# Patient Record
Sex: Female | Born: 1945
Health system: Southern US, Community
[De-identification: ages and names within clinical notes are randomized; demographics above are authoritative.]

## PROBLEM LIST (undated history)

## (undated) DIAGNOSIS — E039 Hypothyroidism, unspecified: Secondary | ICD-10-CM

## (undated) DIAGNOSIS — T4145XA Adverse effect of unspecified anesthetic, initial encounter: Secondary | ICD-10-CM

## (undated) DIAGNOSIS — I10 Essential (primary) hypertension: Secondary | ICD-10-CM

## (undated) DIAGNOSIS — Z9889 Other specified postprocedural states: Secondary | ICD-10-CM

## (undated) DIAGNOSIS — M069 Rheumatoid arthritis, unspecified: Secondary | ICD-10-CM

## (undated) DIAGNOSIS — R112 Nausea with vomiting, unspecified: Secondary | ICD-10-CM

## (undated) DIAGNOSIS — M1990 Unspecified osteoarthritis, unspecified site: Secondary | ICD-10-CM

## (undated) DIAGNOSIS — T8859XA Other complications of anesthesia, initial encounter: Secondary | ICD-10-CM

## (undated) DIAGNOSIS — G4730 Sleep apnea, unspecified: Secondary | ICD-10-CM

## (undated) DIAGNOSIS — K219 Gastro-esophageal reflux disease without esophagitis: Secondary | ICD-10-CM

## (undated) DIAGNOSIS — S0300XA Dislocation of jaw, unspecified side, initial encounter: Secondary | ICD-10-CM

## (undated) DIAGNOSIS — I639 Cerebral infarction, unspecified: Secondary | ICD-10-CM

## (undated) DIAGNOSIS — G709 Myoneural disorder, unspecified: Secondary | ICD-10-CM

## (undated) DIAGNOSIS — D649 Anemia, unspecified: Secondary | ICD-10-CM

## (undated) HISTORY — DX: Rheumatoid arthritis, unspecified: M06.9

## (undated) HISTORY — PX: ANKLE SURGERY: SHX546

## (undated) HISTORY — PX: HAND ARTHROPLASTY: SHX968

## (undated) HISTORY — PX: TONSILLECTOMY: SUR1361

## (undated) HISTORY — DX: Cerebral infarction, unspecified: I63.9

## (undated) HISTORY — PX: COLON SURGERY: SHX602

## (undated) HISTORY — PX: ABDOMINAL SURGERY: SHX537

---

## 2000-07-25 ENCOUNTER — Ambulatory Visit (HOSPITAL_COMMUNITY): Admission: RE | Admit: 2000-07-25 | Discharge: 2000-07-25 | Payer: Self-pay | Admitting: Gastroenterology

## 2001-04-24 ENCOUNTER — Ambulatory Visit (HOSPITAL_COMMUNITY): Admission: RE | Admit: 2001-04-24 | Discharge: 2001-04-24 | Payer: Self-pay | Admitting: Obstetrics and Gynecology

## 2001-04-24 ENCOUNTER — Encounter: Payer: Self-pay | Admitting: Obstetrics and Gynecology

## 2002-04-29 ENCOUNTER — Ambulatory Visit (HOSPITAL_COMMUNITY): Admission: RE | Admit: 2002-04-29 | Discharge: 2002-04-29 | Payer: Self-pay | Admitting: Obstetrics and Gynecology

## 2002-04-29 ENCOUNTER — Encounter: Payer: Self-pay | Admitting: Obstetrics and Gynecology

## 2003-05-05 ENCOUNTER — Encounter: Payer: Self-pay | Admitting: Obstetrics and Gynecology

## 2003-05-05 ENCOUNTER — Ambulatory Visit (HOSPITAL_COMMUNITY): Admission: RE | Admit: 2003-05-05 | Discharge: 2003-05-05 | Payer: Self-pay | Admitting: Obstetrics and Gynecology

## 2003-08-08 HISTORY — PX: LUMBAR SPINE SURGERY: SHX701

## 2003-08-08 HISTORY — PX: ABDOMINAL HYSTERECTOMY: SHX81

## 2004-04-20 ENCOUNTER — Ambulatory Visit: Payer: Self-pay | Admitting: Physical Medicine & Rehabilitation

## 2004-04-20 ENCOUNTER — Inpatient Hospital Stay (HOSPITAL_COMMUNITY): Admission: EM | Admit: 2004-04-20 | Discharge: 2004-04-28 | Payer: Self-pay | Admitting: Emergency Medicine

## 2004-04-20 ENCOUNTER — Ambulatory Visit: Payer: Self-pay | Admitting: Infectious Diseases

## 2004-04-22 ENCOUNTER — Ambulatory Visit: Payer: Self-pay | Admitting: Family Medicine

## 2004-04-23 ENCOUNTER — Encounter: Payer: Self-pay | Admitting: Family Medicine

## 2004-04-25 ENCOUNTER — Encounter (INDEPENDENT_AMBULATORY_CARE_PROVIDER_SITE_OTHER): Payer: Self-pay | Admitting: *Deleted

## 2004-04-28 ENCOUNTER — Ambulatory Visit: Payer: Self-pay | Admitting: Physical Medicine & Rehabilitation

## 2004-04-28 ENCOUNTER — Inpatient Hospital Stay
Admission: RE | Admit: 2004-04-28 | Discharge: 2004-05-05 | Payer: Self-pay | Admitting: Physical Medicine & Rehabilitation

## 2004-04-28 ENCOUNTER — Ambulatory Visit: Payer: Self-pay | Admitting: Psychology

## 2004-05-05 ENCOUNTER — Inpatient Hospital Stay (HOSPITAL_COMMUNITY): Admission: AD | Admit: 2004-05-05 | Discharge: 2004-05-07 | Payer: Self-pay | Admitting: Family Medicine

## 2004-05-18 ENCOUNTER — Ambulatory Visit: Payer: Self-pay | Admitting: Physical Medicine & Rehabilitation

## 2004-06-20 ENCOUNTER — Ambulatory Visit (HOSPITAL_COMMUNITY): Admission: RE | Admit: 2004-06-20 | Discharge: 2004-06-20 | Payer: Self-pay | Admitting: Obstetrics and Gynecology

## 2004-06-20 ENCOUNTER — Ambulatory Visit: Payer: Self-pay | Admitting: Infectious Diseases

## 2004-09-07 ENCOUNTER — Ambulatory Visit: Payer: Self-pay | Admitting: Infectious Diseases

## 2004-12-26 ENCOUNTER — Ambulatory Visit (HOSPITAL_COMMUNITY): Admission: RE | Admit: 2004-12-26 | Discharge: 2004-12-26 | Payer: Self-pay | Admitting: Family Medicine

## 2005-01-03 ENCOUNTER — Ambulatory Visit (HOSPITAL_COMMUNITY): Admission: RE | Admit: 2005-01-03 | Discharge: 2005-01-03 | Payer: Self-pay | Admitting: Family Medicine

## 2005-06-21 ENCOUNTER — Ambulatory Visit (HOSPITAL_COMMUNITY): Admission: RE | Admit: 2005-06-21 | Discharge: 2005-06-21 | Payer: Self-pay | Admitting: Family Medicine

## 2005-06-22 ENCOUNTER — Inpatient Hospital Stay (HOSPITAL_COMMUNITY): Admission: EM | Admit: 2005-06-22 | Discharge: 2005-06-26 | Payer: Self-pay | Admitting: Emergency Medicine

## 2005-07-06 ENCOUNTER — Ambulatory Visit (HOSPITAL_COMMUNITY): Admission: RE | Admit: 2005-07-06 | Discharge: 2005-07-06 | Payer: Self-pay

## 2005-08-01 ENCOUNTER — Ambulatory Visit (HOSPITAL_COMMUNITY): Admission: RE | Admit: 2005-08-01 | Discharge: 2005-08-01 | Payer: Self-pay

## 2005-08-01 ENCOUNTER — Ambulatory Visit (HOSPITAL_COMMUNITY): Admission: RE | Admit: 2005-08-01 | Discharge: 2005-08-01 | Payer: Self-pay | Admitting: Obstetrics and Gynecology

## 2005-08-13 ENCOUNTER — Emergency Department (HOSPITAL_COMMUNITY): Admission: EM | Admit: 2005-08-13 | Discharge: 2005-08-13 | Payer: Self-pay | Admitting: Emergency Medicine

## 2005-08-14 ENCOUNTER — Ambulatory Visit: Payer: Self-pay | Admitting: Orthopedic Surgery

## 2005-08-23 ENCOUNTER — Ambulatory Visit: Payer: Self-pay | Admitting: Orthopedic Surgery

## 2005-08-24 ENCOUNTER — Encounter (HOSPITAL_COMMUNITY): Admission: RE | Admit: 2005-08-24 | Discharge: 2005-09-23 | Payer: Self-pay | Admitting: Orthopedic Surgery

## 2005-09-13 ENCOUNTER — Ambulatory Visit: Payer: Self-pay | Admitting: Orthopedic Surgery

## 2005-10-03 ENCOUNTER — Inpatient Hospital Stay (HOSPITAL_COMMUNITY): Admission: EM | Admit: 2005-10-03 | Discharge: 2005-10-06 | Payer: Self-pay | Admitting: Emergency Medicine

## 2005-10-22 ENCOUNTER — Inpatient Hospital Stay (HOSPITAL_COMMUNITY): Admission: EM | Admit: 2005-10-22 | Discharge: 2005-11-03 | Payer: Self-pay | Admitting: Emergency Medicine

## 2005-10-27 ENCOUNTER — Encounter (INDEPENDENT_AMBULATORY_CARE_PROVIDER_SITE_OTHER): Payer: Self-pay | Admitting: Specialist

## 2006-04-06 ENCOUNTER — Encounter (INDEPENDENT_AMBULATORY_CARE_PROVIDER_SITE_OTHER): Payer: Self-pay | Admitting: *Deleted

## 2006-04-06 ENCOUNTER — Inpatient Hospital Stay (HOSPITAL_COMMUNITY): Admission: RE | Admit: 2006-04-06 | Discharge: 2006-04-14 | Payer: Self-pay

## 2006-08-09 ENCOUNTER — Ambulatory Visit (HOSPITAL_COMMUNITY): Admission: RE | Admit: 2006-08-09 | Discharge: 2006-08-09 | Payer: Self-pay | Admitting: Family Medicine

## 2007-05-30 ENCOUNTER — Ambulatory Visit (HOSPITAL_COMMUNITY): Admission: RE | Admit: 2007-05-30 | Discharge: 2007-05-30 | Payer: Self-pay | Admitting: Orthopedic Surgery

## 2007-09-27 ENCOUNTER — Ambulatory Visit (HOSPITAL_COMMUNITY): Admission: RE | Admit: 2007-09-27 | Discharge: 2007-09-27 | Payer: Self-pay | Admitting: Family Medicine

## 2008-10-13 ENCOUNTER — Ambulatory Visit (HOSPITAL_COMMUNITY): Admission: RE | Admit: 2008-10-13 | Discharge: 2008-10-13 | Payer: Self-pay | Admitting: Family Medicine

## 2008-10-22 ENCOUNTER — Encounter (HOSPITAL_COMMUNITY): Admission: RE | Admit: 2008-10-22 | Discharge: 2008-11-21 | Payer: Self-pay | Admitting: Family Medicine

## 2008-10-22 ENCOUNTER — Ambulatory Visit (HOSPITAL_COMMUNITY): Payer: Self-pay | Admitting: Family Medicine

## 2009-12-13 ENCOUNTER — Ambulatory Visit (HOSPITAL_COMMUNITY): Admission: RE | Admit: 2009-12-13 | Discharge: 2009-12-13 | Payer: Self-pay | Admitting: Family Medicine

## 2010-04-07 ENCOUNTER — Encounter: Admission: RE | Admit: 2010-04-07 | Discharge: 2010-04-07 | Payer: Self-pay | Admitting: General Surgery

## 2010-04-18 ENCOUNTER — Ambulatory Visit (HOSPITAL_BASED_OUTPATIENT_CLINIC_OR_DEPARTMENT_OTHER): Admission: RE | Admit: 2010-04-18 | Discharge: 2010-04-18 | Payer: Self-pay | Admitting: General Surgery

## 2010-08-27 ENCOUNTER — Encounter: Payer: Self-pay | Admitting: Obstetrics and Gynecology

## 2010-10-04 ENCOUNTER — Emergency Department (HOSPITAL_COMMUNITY)
Admission: EM | Admit: 2010-10-04 | Discharge: 2010-10-05 | Disposition: A | Discharge status: Home / Self Care | Payer: Medicare Other | Attending: Emergency Medicine | Admitting: Emergency Medicine

## 2010-10-04 DIAGNOSIS — R109 Unspecified abdominal pain: Secondary | ICD-10-CM | POA: Insufficient documentation

## 2010-10-04 DIAGNOSIS — K219 Gastro-esophageal reflux disease without esophagitis: Secondary | ICD-10-CM | POA: Insufficient documentation

## 2010-10-04 DIAGNOSIS — E039 Hypothyroidism, unspecified: Secondary | ICD-10-CM | POA: Insufficient documentation

## 2010-10-04 DIAGNOSIS — M069 Rheumatoid arthritis, unspecified: Secondary | ICD-10-CM | POA: Insufficient documentation

## 2010-10-04 DIAGNOSIS — I10 Essential (primary) hypertension: Secondary | ICD-10-CM | POA: Insufficient documentation

## 2010-10-04 DIAGNOSIS — R195 Other fecal abnormalities: Secondary | ICD-10-CM | POA: Insufficient documentation

## 2010-10-04 DIAGNOSIS — Z79899 Other long term (current) drug therapy: Secondary | ICD-10-CM | POA: Insufficient documentation

## 2010-10-04 DIAGNOSIS — R112 Nausea with vomiting, unspecified: Secondary | ICD-10-CM | POA: Insufficient documentation

## 2010-10-04 LAB — DIFFERENTIAL
Basophils Absolute: 0 10*3/uL (ref 0.0–0.1)
Basophils Relative: 0 % (ref 0–1)
Eosinophils Absolute: 0 10*3/uL (ref 0.0–0.7)
Lymphocytes Relative: 20 % (ref 12–46)
Neutro Abs: 4.8 10*3/uL (ref 1.7–7.7)

## 2010-10-04 LAB — OCCULT BLOOD, POC DEVICE: Fecal Occult Bld: POSITIVE

## 2010-10-04 LAB — CBC
Hemoglobin: 13.5 g/dL (ref 12.0–15.0)
MCH: 31.3 pg (ref 26.0–34.0)
WBC: 6.6 10*3/uL (ref 4.0–10.5)

## 2010-10-04 LAB — BASIC METABOLIC PANEL
BUN: 14 mg/dL (ref 6–23)
Calcium: 9.9 mg/dL (ref 8.4–10.5)
GFR calc Af Amer: >60 mL/min (ref >60)
GFR calc non Af Amer: 52 mL/min — ABNORMAL LOW (ref >60)
Glucose, Bld: 119 mg/dL — ABNORMAL HIGH (ref 70–99)
Potassium: 4.3 mEq/L (ref 3.5–5.1)

## 2010-10-13 ENCOUNTER — Ambulatory Visit
Admission: RE | Admit: 2010-10-13 | Discharge: 2010-10-13 | Disposition: A | Discharge status: Home / Self Care | Payer: Medicare Other | Source: Ambulatory Visit | Attending: Gastroenterology | Admitting: Gastroenterology

## 2010-10-13 ENCOUNTER — Other Ambulatory Visit: Payer: Self-pay | Admitting: Gastroenterology

## 2010-10-13 DIAGNOSIS — R1110 Vomiting, unspecified: Secondary | ICD-10-CM

## 2010-10-13 DIAGNOSIS — R110 Nausea: Secondary | ICD-10-CM

## 2010-10-13 MED ORDER — IOHEXOL 300 MG/ML  SOLN
100.0000 mL | Freq: Once | INTRAMUSCULAR | Status: AC | PRN
  Administered 2010-10-13: 100 mL via INTRAVENOUS

## 2010-10-20 LAB — POCT I-STAT, CHEM 8
BUN: 19 mg/dL (ref 6–23)
Calcium, Ion: 1.2 mmol/L (ref 1.12–1.32)
Chloride: 104 mEq/L (ref 96–112)
Potassium: 3.7 mEq/L (ref 3.5–5.1)
Sodium: 140 mEq/L (ref 135–145)
TCO2: 29 mmol/L (ref 0–100)

## 2010-12-23 NOTE — Consult Note (Signed)
NAME:  Sabrina, Adams NO.:  [id number]   MEDICAL RECORD NO.:  [id number]                   PATIENT TYPE:  INP   LOCATION:  2102                                 FACILITY:  [facility]   PHYSICIAN:  Hilda Lias, M.D.                DATE OF BIRTH:  December 04, 1945   DATE OF CONSULTATION:  04/22/2004  DATE OF DISCHARGE:                                   CONSULTATION   Sabrina Adams is a lady who was admitted to [facility] with lower  back pain and abdominal pain.  She was admitted on April 20, 2004.  Today about 11:00 a.m. I was called by Dr. Gerda Diss to let me know about this  patient and they found the possibility that she might have pus in the spinal  cord.  Also he told me that the patient has a  positive blood culture and  she had a completed workup of the abdomen which was negative.  Because of  the finding I told him that ideally the patient needs to be transferred to  the Medical Service here at [facility], and we will review the x-  ray and we will talk about the need for surgery.  I told him that I was  concerned about the pus in the spinal cord itself with the possibility of  already displacement of the cord.  Since then they called the Teaching  Service and the patient was accepted about 1:30.  I have been waiting for  the patient to be transferred to [facility] and the operative room  was on standby.  About half an hour ago while I was in surgery I got call  that the patient finally arrived from [facility].  The patient was  admitted to 70.  The patient was telling me that the delay was because  they were doing more MRIs of the lumbar spine.  This lady is quite sick, she  is having quite a bit of tenderness in the thoracolumbar area.  She had been  complaining of constipation.  She had been having some abdominal pain.  This  problem started this week.  At [facility] she was started on IV  antibiotic.  She  has had evaluation by the gastroenterologist.  No film was  sent, but I was able to see the x-ray with Dr. Manson Passey from the X-ray  Department.  This lady has had decreased appetite, she is nauseated, she  cannot eat, and she had been constipated.  She denies any problem with her  bladder.   PAST MEDICAL HISTORY:  1.  She has a previous appendectomy.  2.  Oophorectomy.  3.  Colonoscopy.  4.  Surgery on the eyes.  5.  She has multiple problems with the hands and the extremities secondary      to rheumatoid arthritis.   MEDICATIONS:  1.  Prednisone 5 mg 1/2  tablet every day.  2.  Plaquenil 200 mg two times a day.  3.  Diovan.  4.  Aspirin.  5.  Enbrel 25 mg twice a week.  6.  Calcium.  7.  Nexium.   FAMILY HISTORY:  Positive for coronary artery disease and a stroke.   ALLERGIES:  SHE IS NOT ALLERGIC TO ANY MEDICATIONS.   PHYSICAL EXAMINATION:  A patient who is in the 5727 room, she is quite sick.  In talking to her husband, she feels nauseated.  HEENT:  Normal.  NECK:  She is able to flex to the right with minimal discomfort.  LUNGS:  Unremarkable bilaterally.  HEART:  Sounds normal.  ABDOMEN:  There is some tenderness in the left lower quadrant.  I cannot  feel any mass.  EXTREMITIES:  She had deformity of the hands secondary to the chronic  rheumatoid arthritis.  NEUROLOGIC:  Mental status normal.  During my physical examination she was  complaining of some bifrontal headache.  Reflexes are 1+, no __________  not  really specific.  Strength seems to be normal, but it is quite limited  secondary to the pain.  Per patient the thoracolumbar area is quite tender.  I reviewed the MRI through the screen.  I do not have the picture in front  of me, but looking at the MRI with Dr. Manson Passey it seemed that this lady has a  large collection of probably pus in the thoracolumbar and also T12 until  about L3-L4.  She has also severe stenosis at this level and there is  displacement of the  spinal cord itself.  I had to point out that I had note  of the MRI with me, not the official report.   CLINICAL IMPRESSION:  1.  Sepsis.  Subdural empyema from T12 down to L3-L4.  2.  History of rheumatoid arthritis.   RECOMMENDATIONS:  I talked to her and her husband at length.  I told them  that I wanted to take her to surgery as soon as possible to do a  thoracolumbar laminotomy and open the dura matter to remove the empyema.  I  told him that this also needs to be done as soon as possible and we did not  wait too long because of the high possibility of worsening including the  possibility of paraplegia.  The risks were explained including the  possibility of infection, paralysis, no improvement whatsoever, worsening of  her condition.  It is 8:30, I called the operating room, we are going to  take this lady as soon as possible for surgery.      EB/MEDQ  D:  04/22/2004  T:  04/25/2004  Job:  [id number]

## 2010-12-23 NOTE — Discharge Summary (Signed)
NAME:  Sabrina Adams, Sabrina Adams NO.:  [id number]   MEDICAL RECORD NO.:  [id number]            PATIENT TYPE:   LOCATION:                                 FACILITY:   PHYSICIAN:  Lebron Conners, M.D.        DATE OF BIRTH:   DATE OF ADMISSION:  DATE OF DISCHARGE:  11/03/2005                                 DISCHARGE SUMMARY   HISTORY:  This is a 65 year old white female with recurrent diverticulitis  of the sigmoid colon with pelvic abscess.  She had been hospitalized in  November at [facility] and had had smoldering problems since that  time.  CT on this date showed a 5 cm abscess adjacent to the rectosigmoid.  She was admitted to the hospital for drainage, antibiotic treatment; and  probable colectomy.  She has no GI problems.   PAST MEDICAL HISTORY:  Remarkable for rheumatoid arthritis.  She had been on  steroids.  She has high blood pressure.  She is not diabetic.  The patient  has had back problems; and had an epidural abscess treated by surgery in  2005.  She had MRSA at that location, I think.  She is on prednisone,  Dilaudid, Phenergan, aspirin, Plaquenil, Neurontin, Diovan, and Mobic.   See history and physical for further details.   PHYSICAL EXAM:  Remarkable for tenderness in the suprapubic area and left  lower quadrant of the abdomen.  No rebound tenderness was found.  There were  had deformities consistent with rheumatoid arthritis.   HOSPITAL COURSE:  I admitted the patient and requested percutaneous drainage  of the pelvic abscess.  This was done by Dr. Elly Modena on October 23, 2005.  The patient did better.  White count came down and she remained afebrile.  The patient was advised to undergo colectomy and was warned that it might  require colostomy if inflammatory change was bad enough.  I took her to the  operating room on October 27, 2005 and did a resection of the rectosigmoid.  There was persistent abscess and marked inflammatory change and  low  resection was required.  I stapled the distal segment and did a diverting  sigmoid colostomy.  The patient did well during the operation.  Postoperatively she had quite a bit of edema and I gave her Lasix to help  that.  She had hypokalemia after that and required potassium replacement.   She developed a wound infection and I had to open the wound and institute  dressing changes.  No organisms grew out.  It is anticipated that she will  have eventual vacuum-assisted closure device for the wound, but this was not  instituted at the time of discharge although it had been used in the  hospital.  Daily nursing changes were arranged.  The patient is asked to see  me in the office in 10-14 days.  She is to use the pain medicine that she  was using before.  Antibiotics will be administered at the time of  discharge.  The patient's colostomy is working well; and she is eating well.  DIAGNOSES:  1.  Diverticulitis of the sigmoid and rectosigmoid with pelvic abscess.  2.  Rheumatoid arthritis.  3.  Hypertension.  4.  Postoperative kalemia.  5.  Wound infection.   OPERATION:  1.  Percutaneous drainage of pelvic abscess.  2.  Sigmoid colectomy with colostomy and stapling of the distal segment.   DISCHARGE CONDITION:  Improved.      Lebron Conners, M.D.  Electronically Signed     WB/MEDQ  D:  11/08/2005  T:  11/09/2005  Job:  [id number]   cc:   Feliberto Gottron. Turner Daniels, M.D.  Fax: [phone]   Brooke Bonito, M.D.  Fax: [phone]

## 2010-12-23 NOTE — Discharge Summary (Signed)
NAME:  Sabrina Adams, Sabrina Adams NO.:  [id number]   MEDICAL RECORD NO.:  [id number]          PATIENT TYPE:  INP   LOCATION:  1315                         FACILITY:  [facility]   PHYSICIAN:  Lebron Conners, M.D.   DATE OF BIRTH:  1945-11-18   DATE OF ADMISSION:  10/02/2005  DATE OF DISCHARGE:  10/06/2005                                 DISCHARGE SUMMARY   HISTORY:  This is a 65 year old white female who was recently hospitalized  here for acute diverticulitis with an inflammatory mass in the pelvis.  At  that time, it was not felt to be drainable by interventional radiology, and  she improved on antibiotic treatment and had continued outpatient treatment  with improvement.  However, she had done worse over the 3 days prior to  readmission and was admitted to the hospital for care by Dr. Carolynne Edouard with a  diagnosis of acute diverticulitis with pelvic abscess.  See the history and  physical for greater details.  She also has gastroesophageal reflux and  rheumatoid arthritis and is on prednisone, Plaquenil, Nexium, Diovan,  hydrochlorothiazide, Neurontin, Mobic, vitamin B, Colace, calcium carbonate,  and multivitamins.  She has intolerances of CODEINE and PERCOCET.   HOSPITAL COURSE:  The patient was admitted and started on intravenous  antibiotics.  Dr. Nehemiah Settle consulted for [facility] to help with  management of medical problems.  Boosts in steroids were given.  Interventional radiology saw the patient and felt that she was a good  candidate to have pelvic abscess drained.  That took place on October 03, 2005, with drainage of purulent material.  She remained stable, defervesced,  and felt better.  White count on February 28 was 19,000.  Antibiotics were  continued.  Follow-up CT was done on October 06, 2005.  It showed good  resolution of the abscess, and her white count was down to 12.3.  The  drainage catheter was removed prior to discharge.  The patient was sent home  on oral  broad-spectrum antibiotics, and short-term follow-up was arranged  with me.  She was to resume all prehospital medications.   DIAGNOSIS:  1.  Acute diverticulitis of the sigmoid colon with abscess.  2.  Rheumatoid arthritis.  3.  Hypertension.  4.  Gastroesophageal reflux disease.   OPERATION:  Image guided drainage of pelvic abscess.   DISCHARGE CONDITION:  Improved.      Lebron Conners, M.D.  Electronically Signed     WB/MEDQ  D:  10/18/2005  T:  10/19/2005  Job:  [id number]

## 2010-12-23 NOTE — Discharge Summary (Signed)
NAMEMarland Adams  Sabrina, Adams NO.:  [id number]   MEDICAL RECORD NO.:  [id number]          PATIENT TYPE:  ORB   LOCATION:  4502                         FACILITY:  [facility]   PHYSICIAN:  Ellwood Dense, M.D.   DATE OF BIRTH:  [birth year]   DATE OF ADMISSION:  04/28/2004  DATE OF DISCHARGE:  05/05/2004                                 DISCHARGE SUMMARY   DISCHARGE DIAGNOSES:  1.  Delirium with a question of paranoia.  2.  I&D epidural abscess.  3.  History of methicillin-susceptible staphylococcus aureus infection.  4.  Rheumatoid arthritis.  5.  Hypertension.  6.  Mild hypokalemia.   HISTORY OF PRESENT ILLNESS:  Sabrina Adams is a 65 year old female with a  history of RA, hypertension, originally admitted to [facility] with  low back pain and abdominal pain.  Workup done revealed inflammation of  colon and gram positive cocci in blood.  She was treated with antibiotics  without any improvement.  Further workup done revealed epidural abscess and  subdural lymphedema T12 to L3-L4 region.  The patient was transferred to  [facility] and underwent thoracolumbar laminectomy for evacuation  of abscess on September 17 by Dr. Jeral Fruit.  Postoperatively, was continued on  IV Ancef for methicillin-susceptible staphylococcus aureus bacteremia,  epidural abscess and osteomyelitis treatment.  ID recommended antibiotics  for 42 days and repeat MRI of the spine in four weeks.  Postoperatively, she  had problems with nausea and constipation.  The patient was made n.p.o.  secondary to colonic ileus.  Therapy was initiated and the patient is  minimal assist for transfers, close supervision for ambulating 12 feet with  a rolling walker.   PAST MEDICAL HISTORY:  Significant for RA, diverticulosis, appendectomy,  hysterectomy, right knee cartilage excision, left foot Morton's neuroma.   ALLERGIES:  CODEINE, VICODIN AND OXYCODONE.   FAMILY HISTORY:  Positive for coronary artery  disease and CVA.   SOCIAL HISTORY:  The patient is married.  Back to work 7/05 past recent  surgery.  She was independent prior to admission.  She lives in a two-level  homes with two to three steps at the entry.  She does not use any tobacco or  alcohol.   HOSPITAL COURSE:  Sabrina Adams was admitted on September 22 for therapies to  consist of PT, OT daily.  Past admission, the patient was maintained on IV  Ancef throughout her stay.  Reglan was added secondary to colonic ileus, and  diet was slowly advanced to regular.   LABORATORY DATA:  Labs done on September 23 revealed sodium 136, potassium  3.4, chloride 104, CO2 29.  BUN 5, creatinine 0.7, glucose 97.  The  patient's hypokalemia and was supplemented, and continued to avoid any  recurrence of ileus.  CBC done showed hemoglobin 11.4, hematocrit 32.3,  white count 11.6, platelets 580,000.  The patient's back incision was  monitored along, and it was noted to be healing well without any signs or  symptoms of infection.  Initially, the patient progressed along well.  The  patient reached modified independent goal for ADL's, modified independent  goal for ambulating 200 feet.  She was set for discharge on May 04, 2004.  However, on that morning, the patient was noted to be __________  with difficulty to redirect.  She was noted to be writing statements over  and over in a little notebook, very suspicious of staff.  Dr. Leonides Cave,  psychology, was consulted for input, and he questioned brief reactive  psychosis with anxiety disorder versus mood disorder with psychotic  features.  No obvious psychological and medical factors to account for the  patient's apparent mental status changes.  He also recommended psychiatric  consultation prior to discharge.   On September 28 in the p.m., the patient was noted to have swelling in the  left clavicular area.  CT of the neck done showed no acute abnormality.  No  focal fluid collection.   Chest x-ray done showed bibasilar atelectasis.   LABORATORY DATA:  Check of labs on September 29 revealed sodium 131,  potassium 3.6, chloride 95, CO 26, BUN 6, creatinine 0.7.  Glucose 97.  Check of CBC revealed leukocytosis to be resolving with white count of 7.2.  Dr. Antonietta Breach was consulted for input.  He felt the patient with  delirium, however continued to initiate organic workup with imaging blood  studies for reversible CNS cause.  Complete workup including imaging as well  as blood cultures were ordered to rule out extension of infection.  Also,  Family Practice teaching service was consulted regarding input.  MRI of the  head was ordered.  MRI of the brain was ordered showing stable to slightly  improved __________  enhancement over frontal lobe and tentorium.  MRI of  the lumbar spine revealed overall improvement, status post __________  for  epidural access.  Check of labs showed the patient with some further  decrease in sodium at 131, otherwise within normal limits.  FPTS was  consulted regarding input and further workup monitoring on acute floor as  the patient had reached her rehabilitation goals.  The patient's insight  continued to be poor with mood being depressed and tearful.  She continued  to exhibit paranoid behavior.  On May 05, 2004, the patient was  discharged to acute floor for further workup and monitoring.   DISCHARGE MEDICATIONS:  1.  Ecotrin 81 mg a day.  2.  Ancef 2 mg IV q.8h.  3.  Tequin 200 mg q.12h.  4.  Avapro 200 mg a day.  5.  Prednisone 25 mg a day.  6.  Protonix 40 mg a day.  7.  Dulcolax suppositories, p.r.n.  8.  Darvocet 100, one to two p.o. q.i.d. p.r.n.   DISCHARGE INSTRUCTIONS:  Activity and further adjustment in meds to be done  by FPTS.       PP/MEDQ  D:  06/29/2004  T:  06/30/2004  Job:  [id number]   cc:   Lorin Picket A. Gerda Diss, MD  [address], [address]  [place]  [place] [address]  Fax: [phone]  Hilda Lias, M.D.   [address]  [place], [place] [address]  Fax: [phone]   Antonietta Breach, M.D.   [address]  [address]  [place]  [place] [address]  Fax: [phone]

## 2010-12-23 NOTE — Procedures (Signed)
[place]  Patient:    Sabrina Adams, Sabrina Adams                   MRN: [id number] Proc. Date: 07/25/00 Adm. Date:  [id number] Attending:  Charna Elizabeth CC:         Loran Senters, M.D.   Procedure Report  DATE OF BIRTH:  1946-04-24  REFERRING PHYSICIAN:  Loran Senters, M.D.  PROCEDURE PERFORMED:  Colonoscopy.  ENDOSCOPIST:  Anselmo Rod, M.D.  INSTRUMENT USED:  Olympus video colonoscope.  INDICATIONS FOR PROCEDURE:  Change in bowel habits with guaiac positive stool, worsening constipation in a 64 year old white female, rule out colonic polyps, masses, hemorrhoids, etc.  PREPROCEDURE PREPARATION:  Informed consent was procured from the patient. The patient was fasted for eight hours prior to the procedure and prepped with a bottle of magnesium citrate and a gallon of NuLytely the night prior to the procedure.  PREPROCEDURE PHYSICAL:  The patient had stable vital signs.  Neck supple. Chest clear to auscultation.  S1, S2 regular.  Abdomen soft with normal abdominal bowel sounds.  DESCRIPTION OF PROCEDURE:  The patient was placed in the left lateral decubitus position and sedated with 100 mg of Demerol and 10 mg of Versed intravenously.  Once the patient was adequately sedated and maintained on low-flow oxygen and continuous cardiac monitoring, the Olympus video colonoscope was advanced from the rectum to the cecum without difficulty. Except for a few early diverticula and small nonbleeding internal hemorrhoids, no other abnormalities were seen.  The patient tolerated the procedure well which was complete up to the cecum.  IMPRESSION: 1. Few left-sided diverticula. 2. Small nonbleeding internal hemorrhoids.  RECOMMENDATIONS:  The patient has been advised to increase the fluid and fiber in her diet and outpatient follow-up is advised on a p.r.n. basis.DD:  07/25/00 TD:  07/26/00 Job: [id number]

## 2010-12-23 NOTE — H&P (Signed)
NAME:  Sabrina Adams, Sabrina Adams NO.:  [id number]   MEDICAL RECORD NO.:  [id number]          PATIENT TYPE:  INP   LOCATION:  1616                         FACILITY:  [facility]   PHYSICIAN:  Lebron Conners, M.D.   DATE OF BIRTH:  [birth year]   DATE OF ADMISSION:  10/22/2005  DATE OF DISCHARGE:                                HISTORY & PHYSICAL   CHIEF COMPLAINT:  Diverticulitis.   PRESENT ILLNESS:  Sabrina Adams is a 65 year old white female whom I admitted  to [facility] in November for acute diverticulitis with severe  inflammatory changes around the sigmoid colon. She subsequently developed an  abscess which has undergone drainage and now has recurred with fever, lower  abdominal pain, leukocytosis, and a CT scan showing a 5-cm abscess adjacent  to the rectosigmoid. She is admitted to the hospital for percutaneous  drainage, intravenous antibiotics, and probable sigmoid colectomy. She has  no other chronic colon problems.   PAST MEDICAL HISTORY:  She has had rheumatoid arthritis for sometime and is  steroid dependent. She had a hysterectomy2005 and in 2005 had to have  drainage of a spinal epidural abscess. It was caused by MRSA, I believe. She  has had a fractured ankle.   MEDICINES:  Prednisone, Dilaudid, Phenergan, aspirin, Plaquenil, Neurontin,  Diovan, Mobic.   She has high blood pressure. She is not diabetic. She reports intolerance a  LOT OF NARCOTICS including PERCOCET and LORCET.   REVIEW OF SYSTEMS:  Unremarkable beyond the above symptomatology.   FAMILY HISTORY:  Unremarkable.   PHYSICAL EXAMINATION:  VITAL SIGNS: Were unremarkable.  GENERAL: The mental status was normal.  HEAD AND NECK: Exam unremarkable with no scleral icterus. No thyroid  enlargement. No neck mass.  CHEST: Clear to auscultation.  BREASTS: Normal.  HEART: Rate and rhythm normal without murmur or gallop.  ABDOMEN: Tender in the suprapubic and left lower quadrant areas. No rebound  tenderness. Bowel sounds present. No upper abdominal tenderness.  EXTREMITIES: Minimal edema. Good pulses. Minimal hand deformities. Good  pulses in all extremities.  LYMPH NODES: Not enlarged in the groin or neck.   IMPRESSION:  1.  Acute diverticulitis of the sigmoid colon, recurrent, with abscess.  2.  Rheumatoid arthritis on steroids.  3.  High blood pressure   PLAN:  As noted above with broad-spectrum antibiotics and percutaneous  drainage of abscess.      Lebron Conners, M.D.  Electronically Signed     WB/MEDQ  D:  10/24/2005  T:  10/24/2005  Job:  [id number]

## 2010-12-23 NOTE — Discharge Summary (Signed)
NAME:  TIMARA, LOMA NO.:  [id number]   MEDICAL RECORD NO.:  [id number]          PATIENT TYPE:  INP   LOCATION:  1416                         FACILITY:  [facility]   PHYSICIAN:  Lebron Conners, M.D.   DATE OF BIRTH:  08-May-1946   DATE OF ADMISSION:  06/21/2005  DATE OF DISCHARGE:  06/26/2005                                 DISCHARGE SUMMARY   HISTORY OF PRESENT ILLNESS:  This is a 65 year old white female who was  admitted to the hospital on June 21, 2005, because of abdominal pain and  tenderness in the lower abdomen.  This was associated with a CT scan  demonstrating evidence of diverticulitis of the sigmoid colon with a pelvic  abscess.  On personal review with the radiologist, I found that there were  two areas of fairly small abscess and another area of possible abscess, more  likely phlegmon in the area of the rectosigmoid.  We did not find indication  for either operative or percutaneous drainage of these areas at the time of  admission.  White count was 19,000.   PAST MEDICAL HISTORY:  No chronic GI problems. She had a hysterectomy. She  is on treatment for rheumatoid arthritis with multiple medicines including  prednisone. Please see my history and physical for further details.   PHYSICAL EXAMINATION:  The physical exam was remarkable for a temperature of  99, heart rate 100, blood pressure 122/73.  No acute distress.  Deformities  of the extremities to rheumatoid arthritis and marked lower abdominal  tenderness.   HOSPITAL COURSE:  I  started the patient on Primaxin and followed her  closely.  Her temperature became normal immediately, and her white blood  cell count gradually fell to the normal range.  I gave her a boost of the  intravenous of steroid medications because of the likelihood of suppression  of the adrenal glands.  Her blood pressure and electrolytes remained stable  throughout.  She gradually improved.  By November, 20, 2006, she was  afebrile.  A white count was normal.  There was some persistent pain, but  much better.  There was mild left lower quadrant tenderness. CT scan showed  a great improvement in the area of phlegmon in the pelvis.  She had some  diarrhea and that was found not to be due to Clostridium difficile by  antigen test.  The patient wanted to go home and we allowed her to go home  on her previous medications and prescribed ciprofloxacin and Flagyl for her.  We planned a 10-day course of that after discharge.  She is to see me in the  office after a week to 10 days and a CT scan will be obtained in followup.   DISCHARGE DIAGNOSES:  1.  Diverticulitis of the sigmoid colon, improved.  2.  Rheumatoid arthritis.  3.  Hypertension   OPERATIONS:  None.   DISCHARGE CONDITION:  Improved.      Lebron Conners, M.D.  Electronically Signed     WB/MEDQ  D:  07/05/2005  T:  07/05/2005  Job:  [id number]

## 2010-12-23 NOTE — H&P (Signed)
NAME:  Sabrina Adams, Sabrina Adams NO.:  [id number]   MEDICAL RECORD NO.:  [id number]                  PATIENT TYPE:   LOCATION:                                       FACILITY:   PHYSICIAN:  Donna Bernard, M.D.             DATE OF BIRTH:  03/19/1946   DATE OF ADMISSION:  04/20/2004  DATE OF DISCHARGE:                                HISTORY & PHYSICAL   CHIEF COMPLAINT:  Back pain, chills, abdominal pain.   SUBJECTIVE:  This patient is a 65 year old white female with a history of  rheumatoid arthritis, hypertension, and chronic reflux who presents to the  emergency room the day of admission with complaints of severe low back pain.  The patient also actually went to the chiropractor the day before admission.  She was seen, adjustments did not seem to help her discomfort considerably.  On into the night prior to admission, the patient presented onto the  emergency room with severe low back pain and chills.  The patient on further  history noticed a diminishment in appetite.  She also felt somewhat  nauseous.  The patient noted no change in urinary habits nor bowel habits.   The patient claims compliance with her usual medications which include:  1.  Prednisone 5 mg one and a half daily.  2.  Plaquenil 200 mg two daily.  3.  Diovan 160 mg daily.  4.  Enteric coated aspirin 81 mg daily.  5.  Enbrel 25 mg twice per week.  6.  Nexium 40 mg daily.  7.  Calcium tablet 600 mg one b.i.d.   PRIOR SURGERIES:  1.  Remote appendectomy.  2.  Oophorectomy.  3.  Diverticula via colonoscopy in December 2001.  4.  Multiple eye surgeries.  5.  Multiple musculoskeletal problems secondary to cartilage damage and      rheumatoid arthritis.   FAMILY HISTORY:  Positive for hypertension, coronary artery disease, stroke.   ALLERGIES:  None known.  Environmental.   SOCIAL HISTORY:  The patient is married, works in [employer], one  child.  No tobacco abuse, no alcohol  use.   REVIEW OF SYSTEMS:  Otherwise negative.   PHYSICAL EXAMINATION:  VITAL SIGNS:  The patient is afebrile, BP 124/70,  afebrile on presentation.  Of note, several hours after transfer to the  floor she spiked a temp of 101.  GENERAL:  The patient is alert in some significant pain.  HEENT:  Normal.  NECK:  Supple.  LUNGS:  Clear.  HEART:  Regular rate and rhythm.  ABDOMEN:  Left lower quadrant tenderness.  BACK:  Low back very tender to percussion.  Deep tendon reflexes intact.  Leg strength intact.  Sensory exam intact.   SIGNIFICANT LABS:  Initial CBC, white blood count 11.3, hemoglobin 11.4, 94%  neutrophils.  MET-7 potassium low at 3.0.  SGOT slightly up at 46.   CT scan of the abdomen and pelvis  revealed probable colitis of the sigmoid  colon.  A MRI of the lumbar spine revealed spinal stenosis along with  significant disk ruptures at L3 and L4 along with tremendous degenerative  disease.   IMPRESSION:  1.  Acute onset of low back pain, left lower quadrant tenderness, fever and      chills with elevated white blood count, CT scan suggestive of      diverticulitis.  2.  Severe degenerative disk disease with ruptured disks and spinal      stenosis.  3.  Rheumatoid arthritis.  4.  Hypertension.  5.  Reflux.   PLAN:  As per orders.     ___________________________________________                                         Donna Bernard, M.D.   WSL/MEDQ  D:  04/20/2004  T:  04/20/2004  Job:  [id number]

## 2010-12-23 NOTE — Op Note (Signed)
NAME:  Sabrina Adams, Sabrina Adams NO.:  [id number]   MEDICAL RECORD NO.:  [id number]          PATIENT TYPE:  INP   LOCATION:  1616                         FACILITY:  [facility]   PHYSICIAN:  Lebron Conners, M.D.   DATE OF BIRTH:  01-31-1946   DATE OF PROCEDURE:  10/27/2005  DATE OF DISCHARGE:                                 OPERATIVE REPORT   PREOPERATIVE DIAGNOSIS:  Diverticulitis of the distal sigmoid colon with  abscess.   POSTOPERATIVE DIAGNOSIS:  Diverticulitis of the distal sigmoid colon with  abscess.   OPERATION:  Rectosigmoid colectomy with colostomy and stapling of the distal  segment and drainage of the pelvic abscess.   SURGEON:  Dr. Orson Slick.   ANESTHESIA:  General.   ESTIMATED BLOOD LOSS:  250 mL.   SPECIMEN:  Rectosigmoid colon.   COMPLICATIONS:  None.   CONDITION:  To PACU good.   PROCEDURE:  After the patient was monitored and anesthetized and had routine  preparation and draping of the perineum and abdomen with Foley catheter in  place and with her legs and feet in yellowfin stirrups, I made a lower  midline incision from about 2 cm below the umbilicus down to the transverse  lower abdominal incision and near the pubis.  I entered the peritoneal  cavity through the midline and found that there were not very many  adhesions.  I then examined the upper abdomen.  I found that the gallbladder  felt distended but no stones in it.  The intestines all looked normal.  On  the extreme right lateral edge of the liver, there was a very small, hard  nodule which was very benign feeling, felt as if it might be a peritoneal  growth rather than a mass within the liver.  I did not try to visualize it  since it would have been very difficult and since there was a good deal of  inflammation and infection in the pelvis.  I then packed away the transverse  colon, right colon, and small intestine, keeping them cephalad with a  Balfour extender retractor.  I incised the  peritoneum lateral to the sigmoid  colon and dissected on down laterally into the pelvis, noting an  inflammatory mass associated with the distal sigmoid and rectosigmoid.  Early on during the case, I dissected retroperitoneally and identified the  ovarian vessels and the left ureter and kept them in mind and spared them  from harm at all times.  I decided I would be able to resect the mass and  colon, as I felt there was a little bit of soft colon distal.  I divided the  bowel and mid sigmoid using a cutting stapler and then segmentally divided  mesentery between Kelly clamps and ligated the vessels with 2-0 silk,  dissecting straight back to the sacral promontory.  I then used a  combination of blunt dissection and clamping and tying of the mesentery  going distally and mobilized the rectosigmoid.  I incised the peritoneum and  scar from the hysterectomy on each side of the bowel and mobilized it until  I could bring the  inflammatory mass anteriorly and cephalad.  In so doing, I  fell into the abscess cavity and identified the percutaneous drain which was  in place.  There still was a fair amount of pus and debris present.  I  mobilized the bowel, brought the dissection through the mesentery back up to  the bowel distal to the inflammatory mass, and then divided the bowel  distally with the contour cutting stapler.  I made a nice division, and  hemostasis was good on the staple line.  I felt that the inflammatory  process was far too extensive to attempt a primary anastomosis in this  immunocompromised patient on steroids and Enbrel.  I therefore identified  the ends of the staple line with 2-0 Prolene sutures which I left quite long  to find at a later operation for colostomy closure.  I thoroughly irrigated  the pelvis and removed the irrigant and found that hemostasis was good.  I  then placed a 19-French Blake drain in the abscess cavity and brought it out  through a small incision in  the right lower quadrant and secured the drain  to the skin.  I removed packs after mobilizing the sigmoid colon a little  bit more by cutting the peritoneum and adhesions and had plenty of length to  do a colostomy.  I then identified what I thought would be an ideal location  for a colostomy in the left lower quadrant, cut out about a 3 cm circle of  skin and removed some subcutaneous tissue then incised the anterior rectus  fascia, split the rectus muscle and incised the posterior sheath and dilated  it up until it allowed 2 fingers to go through easily.  I brought the  sigmoid colon through, taking care not to twist it.  It stayed up nicely  with no tension.  It had a nice pink appearance.  I then got correct sponge,  needle, and instrument count and closed the abdominal fascia with running #1  PDS and closed the subcutaneous tissues loosely with staples and packed some  gauze between the sutures.  I then matured the colostomy using a combination  of interrupted and running 3-0 Vicryl suture, and the mucosa was very  healthy after cutting out the staple line and creating the colostomy.  I  applied a colostomy appliance and a bandage and attached the suction bulb to  the drain and concluded the procedure.      Lebron Conners, M.D.  Electronically Signed     WB/MEDQ  D:  10/27/2005  T:  10/29/2005  Job:  [id number]   cc:   Areatha Keas, M.D.  Fax: [phone]

## 2010-12-23 NOTE — H&P (Signed)
NAMEMarland Kitchen  Sabrina Adams, Sabrina Adams NO.:  [id number]   MEDICAL RECORD NO.:  [id number]          PATIENT TYPE:  INP   LOCATION:  1416                         FACILITY:  [facility]   PHYSICIAN:  Lebron Conners, M.D.   DATE OF BIRTH:  12/17/1945   DATE OF ADMISSION:  06/21/2005  DATE OF DISCHARGE:                                HISTORY & PHYSICAL   CHIEF COMPLAINT:  Abdominal pain.   PRESENT ILLNESS:  The patient is a 65 year old white female with history of  diverticulosis but no episodes of clinical diverticulitis in the past who  has had fever to 102 degrees and rather severe lower abdominal pain. She saw  a physician and was sent for a CT scan which demonstrates evidence of  diverticulitis of the sigmoid colon with pelvic abscess. On personal review  of the CT with the radiologist I find that she has two fairly small  abscesses low in the pelvis and a larger area of either abscess or phlegmon  in the area of the rectosigmoid. There is no evidence of peritonitis or free  fluid or spillage of contrast material outside the colon. The patient has a  white count of 19,000. She is admitted to the hospital for intravenous  antibiotics and supportive care and treatment of this condition.   PAST MEDICAL HISTORY:  She has no chronic problems with GI disease. She has  had hospitalization and laminectomy for a spinal staph infection last year.  This was felt to be due to immunocompromise due to her treatment for  rheumatoid arthritis, which is fairly severe and which she is on Enbrel and  steroids, Plaquenil, and analgesics. This is stable. She is had a  hysterectomy. She has had knee surgery with excision of cartilage and a  Morton's neuroma excised, had back surgery in the past and finger surgery.  She also had cataracts removed. She had a tonsillectomy and adenoidectomy  years ago. She has high blood pressure and for that is on Diovan 160 mg  daily. The patient could not give me all her  medicines at time of discharge  and this will be investigated.   SOCIAL HISTORY:  She does not smoke and does not drink. She is married. She  is not employed outside the home.   </FAMILY HISTORY/  Unremarkable.   CHILDHOOD ILLNESSES:  Unremarkable.   REVIEW OF SYSTEMS:  She denies any shortness of breath, chest pain, any  neurological symptoms. She does have neuropathy which she is on Neurontin.  She has no history of any liver or biliary disease.   PHYSICAL EXAMINATION:  GENERAL:  The patient appeared to be in pain,  somewhat ill. Vital signs are as recorded by the nurse staff:  Temperature  of 99, heart rate 100, blood pressure 122/73. She is breathing without  dyspnea. Mental status was unremarkable.  HEENT:  Unremarkable. No carotid bruits. No thyroid enlargement. No neck  mass. No scleral icterus. Mucous membranes were moist.  CHEST:  Clear to auscultation.  HEART:  Rate and rhythm were normal. No murmur or gallop noted.  BREASTS:  No mass.  ABDOMEN:  There is marked tenderness in the left lower quadrant, some in the  right lower quadrant. Bowel sounds were present. Some rebound tenderness in  the left lower quadrant. No hernia is noted.  SKIN:  No lesions are noted.  EXTREMITIES:  There are deformities of the hands consistent with rheumatoid  arthritis. No skin lesions noted on the extremities.  SKIN:  No suspicious nevi or other lesions noted.  LYMPH NODES:  Not enlarged in the groin, axilla or neck.  NEUROLOGICAL:  Grossly normal.   IMPRESSION:  1.  Diverticulitis of the rectosigmoid and sigmoid with perforation and      abscess but disease localized.  2.  Hypertension.  3.  Rheumatoid arthritis.   PLAN:  Hospitalization for IV antibiotics, bowel rest, and very close follow-  up with possible abscess drainage. It is not felt by interventional  radiology that abscess drainage as warranted at this point, but should be  followed.      Lebron Conners, M.D.   Electronically Signed     WB/MEDQ  D:  06/23/2005  T:  06/23/2005  Job:  [id number]

## 2010-12-23 NOTE — Op Note (Signed)
NAME:  Sabrina Adams, Sabrina Adams NO.:  [id number]   MEDICAL RECORD NO.:  [id number]                   PATIENT TYPE:  INP   LOCATION:  2102                                 FACILITY:  [facility]   PHYSICIAN:  Hilda Lias, M.D.                DATE OF BIRTH:  Feb 08, 1946   DATE OF PROCEDURE:  04/23/2004  DATE OF DISCHARGE:                                 OPERATIVE REPORT   PREOPERATIVE DIAGNOSES:  Epidural abscess.  Intradural abscess.  Rheumatoid  arthritis.  Septicemia secondary to Staph.   POSTOPERATIVE DIAGNOSES:  Epidural abscess.  Intradural abscess.  Rheumatoid  arthritis.  Septicemia secondary to Staph.   PROCEDURE:  Bilateral L1, L2, L3, L4, and partial L5 laminectomy.  Removal  of organized infection in the epidural space.  Intradural removal of  infected abscess T12, L1, and L2.  Microscope.   SURGEON:  Hilda Lias, M.D.   INDICATIONS FOR PROCEDURE:  The patient was transferred today because of  back pain.  He was found by x-ray to have a mass in the intradural space as  well as the epidural space.  The patient has complained of fever.  She had  blood culture which was positive for Staph.  She has a longstanding  rheumatoid arthritis.  I talked to her and her husband.  Surgery was advised  including the risks which were no improvement whatsoever, paralysis, need  for further surgery.   DESCRIPTION OF PROCEDURE:  The patient was taken to the OR and she was  positioned in a prone manner.  The back was prepped with Betadine.  Incision  was made in the midline from the lower rib all the way down to the lower  lumbar area.  Muscle was retracted laterally.  X-ray showed that the clip  was at the level of L2.  From then on we removed the spinous process of L1,  L2, L3, L4, and partial of L5.  We then proceeded with the laminectomy using  the drill as well as the Kerrison punch.  We found an organized abscess in  the epidural space from the lower part of T12  all the way down to L4 and a  little bit of L5.  The patient had severe case of stenosis at the level of 4-  5 and decompression was achieved.  From then on after we had good  decompression of the dura matter, the dura matter was kind of yellowish.  It  was really hard to palpation.  We made an incision at the level of L1 and we  opened the dura matter.  We brought the microscope into the area.  Then with  the microscope we found that indeed there was an abscess with a cloudy CSF.  The membrane was resected and we decompressed the spinal cord.  We used a  catheter, the type we use for the brain, and we irrigated above and below.  CSF clear was found and there was good flow of CSF after which before it was  blocked.  A specimen of CSF as well as the wall was sent to the laboratory.  Having good decompression, the area was closed with 4-0 Nurolon.  After we  had watertight closure of the dura matter, a Valsalva maneuver was negative.  Then Tisseel was left in the epidural space.  The wound was closed with  Vicryl and Steri-Strips.  The patient will be going to the intensive care  unit.      EB/MEDQ  D:  04/23/2004  T:  04/25/2004  Job:  [id number]

## 2010-12-23 NOTE — Consult Note (Signed)
NAME:  Sabrina Adams, Sabrina Adams                      ACCOUNT NO.:  [id number]   MEDICAL RECORD NO.:  [id number]                   PATIENT TYPE:  INP   LOCATION:  A336                                 FACILITY:  [facility]   PHYSICIAN:  Lionel December, M.D.                 DATE OF BIRTH:  Oct 24, 1945   DATE OF CONSULTATION:  04/21/2004  DATE OF DISCHARGE:                                   CONSULTATION   REQUESTING PHYSICIAN:  Dr. Lilyan Punt.   REASON FOR CONSULTATION:  Sigmoid infection.   HISTORY OF PRESENT ILLNESS:  Sabrina Adams is a 65 year old Caucasian female  with history of hypertension, rheumatoid arthritis on chronic prednisone who  presented to the hospital at day of admission with severe back pain, nausea,  and chills. She states her back pain started on Monday or Tuesday. She had  actually seen the chiropractor and noted that the adjustments did not seem  to help with her pain. Denies any diarrhea. Actual last bowel movement was  five days ago. No history of melena or rectal bleeding. Upon presentation,  she was found to have left lower quadrant abdominal pain on examination.  Stools were Hemoccult negative. She was found to be mildly anemia with  hemoglobin 11.4. White count was 11.3. She had a CT of the abdomen and  pelvis which revealed slight wall thickening of the distal colon,  questionable colitis versus diverticulitis. She also had prominent stool  throughout the colon, a right hepatic cyst, and fatty liver. She had  evidence of lumbar disk disease, and MRI was recommended. MRI revealed  severe degenerative disk disease of the lumbar spine with spinal stenosis at  L4-5, severe spinal stenosis at L4-L5. She also had less severe stenosis, L2-  L3, L5-S1, L3-4. She has been afebrile with T-max of 102.9. Currently, she  is afebrile. This admission, she has developed vomiting which is felt to be  due to fentanyl patch. She has developed leukocytosis yesterday up to  17,800, and  today is 16,400. Hemoglobin is down to 10, hematocrit 28.6.  Amylase and lipase normal. LFTs normal except for alkaline phosphatase 120,  SGOT 46. She has also had positive blood cultures x2, gram positive cocci in  clusters.   MEDICATIONS PRIOR TO ADMISSION:  1.  Acetaminophen 2 b.i.d.  2.  Mobic q.d.  3.  Prednisone 7.5 mg q.d.  4 . Plaquenil 200 mg 2 q.d.  1.  Diovan 160 mg q.d.  2.  Enteric-coated aspirin 81 mg q.d.  3.  Enbrel 25 mg two every week.  4.  Nexium 40 mg q.d.  5.  Calcium 600 mg b.i.d.   ALLERGIES:  CODEINE, PERCODAN, PERCOCET caused GI upset.   PAST MEDICAL HISTORY:  1.  Rheumatoid arthritis.  2.  Hypertension.  3.  Gastroesophageal reflux disease.  4.  Chronic constipation.  5.  History of Morton's neuroma.  6.  Rhinoplasty.  7.  Complete  hysterectomy May 2005.  8.  Appendectomy.  9.  Oophorectomy.  10. Multiple eye surgeries.  11. Right knee surgery for cartilage repair.  12. Colonoscopy in 2001 by Dr. Loreta Ave revealed left sided diverticula and      small internal hemorrhoids.   FAMILY HISTORY:  The patient is unaware of any history of colon cancer.   SOCIAL HISTORY:  She is married and works in an [employer]. She has  one child. Denies any tobacco or alcohol use.   REVIEW OF SYSTEMS:  GASTROINTESTINAL:  Please see HPI for GI.  MUSCULOSKELETAL:  Complains of low back pain which started 2 to 3 days ago.  CARDIOPULMONARY:  Denies any chest pain or shortness of breath.  NEUROLOGICAL:  Complains of headache.   PHYSICAL EXAMINATION:  VITAL SIGNS:  T-max 102.2, pulse 122, respirations  20, blood pressure 113/97.  GENERAL:  Pleasant, well-developed, well-nourished, Caucasian female who  appears acutely ill. She became nauseated and vomited on several occasions  while I was in the room. No gross hematemesis noted.  SKIN:  Warm and dry. No jaundice.  HEENT:  Sclerae nonicteric. Oropharynx mucosa moist and pink.  CHEST:  Lungs clear to auscultation.   CARDIAC:  Reveals regular rate and rhythm. Normal S1 and S2. No murmurs,  rubs, or gallops.  ABDOMEN:  Positive bowel sounds, soft, nondistended. She has mild left lower  quadrant tenderness to deep palpation. No organomegaly or masses. No rebound  tenderness or guarding.  EXTREMITIES:  No edema.   LABORATORY DATA:  As mentioned in HPI. In addition, platelets 216,000,  sodium 131, potassium 4.2, BUN 18, creatinine 1.2, glucose 146, calcium 7.8.  Total bilirubin 0.7, alkaline phosphatase 120, SGOT 46, SGPT 31, albumin  3.7, amylase 101, lipase 35. Urinalysis:  Moderate blood. CT of the abdomen  and pelvis revealed prominent stool throughout the colon, right hepatitic  cyst, degenerative disease in the lumbar spine with facet overgrowth noted  and prominent central stenosis at L4-L5. Wall thickening of the sigmoid  colon raising the possibility of colitis or diverticulitis. MRI revealed a  severe degenerative disk disease and facet disease changes of the lumbar  spine with mild dextroconvex scoliosis apex L3. Severe central acquired  spinal stenosis L4-L5 with severe L2-L3, L5-S1, and L3-4. Broad based  posterior disk herniations at L3-L4 on L4-L5 with multilevel compression  nerve root at the lateral recesses bilaterally. Additional compression of  the extraforaminal __________ left L5 root by end-plate spur formation  accompanying bulging disk.   IMPRESSION:  The patient is a 65 year old lady who presented with severe low  back pain and is noted to have marked multilevel changes on lumbar MRI;  however, in addition, she is having some left lower quadrant abdominal pain,  a leukocytosis, and was found to have a sigmoid colon thickening on CT.  Blood cultures are positive for gram positive cocci in clusters. She is  noted to have leukocytosis with anemia and takes chronic prednisone for  rheumatoid arthritis. Given that she has no diarrhea or hematochezia, this would make ulcerative  colitis less likely. Question diverticulitis.   PLAN:  Review CT with Dr. Karilyn Cota. Would continue IV antibiotics and  supportive measures as you are. Further recommendations to follow.   I would like to thank Dr. Lilyan Punt for allowing [place] to take part in the  care of this patient.     ________________________________________  ___________________________________________  Tana Coast, P.A.  Lionel December, M.D.   LL/MEDQ  D:  04/21/2004  T:  04/21/2004  Job:  [id number]

## 2010-12-23 NOTE — H&P (Signed)
NAMEMarland Adams  SHYIA, FILLINGIM NO.:  [id number]   MEDICAL RECORD NO.:  [id number]          PATIENT TYPE:  INP   LOCATION:  5010                         FACILITY:  [facility]   PHYSICIAN:  Penni Bombard, MD       DATE OF BIRTH:  November 07, 1945   DATE OF ADMISSION:  05/05/2004  DATE OF DISCHARGE:  05/07/2004                                HISTORY & PHYSICAL   CHIEF COMPLAINT:  Altered mental status.   HISTORY OF PRESENT ILLNESS:  This is a 65 year old female with rheumatoid  arthritis, hypertension, and GERD who was initially admitted with bacteremia  and an epidural abscess and subdural empyema who underwent thoracolumbar  laminectomy for evacuation of abscess on April 23, 2004.  The patient  was then placed on Ancef.  Since admission she has had two positive blood  cultures with MSSA.  The patient was recovering well on SACU when on the  night prior to her scheduled discharge, she began to perseverate about going  home, began staring off into space, and had increasing confusion.  By the  following day, she was refusing to ambulate, would not feed herself, was no  longer oriented, and only recognized nursing staff intermittently.  She  continued to become more despondent with only intermittent episodes of  awareness.  This history is per the nursing staff as the patient was  unresponsive to my questioning and was unwilling to provide history herself.  She has been afebrile since these change began but has had a slightly  increased heart rate, has had no nausea or vomiting, and has complained of  only intermittent back pain.  The patient has been tearful and very paranoid  and suspicious and refuses to answer my questions or speak to me and will  not cooperate with my exam.  Of note, the patient has had similar  psychological responses to narcotics such as Demerol, Vicodin, and Percocet.  However, it is believed that she has not received these medications on this  admission.   ALLERGIES:  1.  CODEINE.  2.  VICODIN.  3.  PERCOCET.  4.  DEMEROL.   MEDICATIONS:  1.  Aspirin 81 mg p.o. every day.  2.  Plaquenil 200 mg p.o. b.i.d.  3.  Avapro 300 mg p.o. every day.  4.  Prednisone 7.5 mg p.o. every day.  5.  Protonix 40 mg p.o. every day.  6.  Senokot S two tabs p.o. q.h.s.  7.  K-Dur 10 mEq p.o. every day.  8.  Ancef 2 grams IV q.8h.  9.  Darvocet-N 100 q.i.d. p.r.n.  10. Tylenol 650 p.o. q.4h. p.r.n.   PAST MEDICAL HISTORY AND PAST SURGICAL HISTORY:  1.  Rheumatoid arthritis.  2.  Hypertension.  3.  GERD.  4.  History of diverticula.  5.  Status post appendectomy.  6.  Status post oophorectomy.  7.  Status post hysterectomy in May 2005.   FAMILY HISTORY:  Significant for hypertension, coronary artery disease, and  CVA.   SOCIAL HISTORY:  Married, lives in [place], and has one child.  Denies  tobacco, alcohol, and drug use.  REVIEW OF SYSTEMS:  Unable to obtain secondary to the patient's  unwillingness to answer my questions.   PHYSICAL EXAMINATION:  VITAL SIGNS:  Temperature 98.5, pulse 98, respiratory  rate is 20, BP 135/71, O2 96 on room air.  GENERAL:  She is awake; however, she is not oriented.  She does not know her  name, and she is very tearful.  HEENT:  Pupils are equal and round; however, she closes her eyes when I  attempted to check reactivity and refused to open them.  Extraocular muscles  are intact.  Throat shows no exudates and is not erythematous.  Her eyes are  slightly protuberant.  NECK:  Supple.  There is no lymphadenopathy.  There is a small area of  approximately 2- to -4-cm in size of swelling above her left clavicle.  CV:  Tachy.  No murmurs, rubs or gallops.  Regular rhythm.  LUNGS:  Clear to auscultation bilaterally anteriorly.  No wheezes, crackles,  or rales.  ABDOMEN:  Positive bowel sounds.  Nontender, nondistended, soft.  No  hepatosplenomegaly.  EXTREMITIES:  Warm with 2+ pulses.  Her left hand has radial  deviation of  the fingers with deformities of her fingers.  The right hand has minor  deformities.  The ankles bilaterally show minor signs of deformities from  her rheumatoid arthritis.  NEURO:  The patient would not cooperate with neuro exam.  It was difficult  to evaluate; however, cranial nerves II-XII appear to be grossly intact.  The uvula is midline.  Strength appears to be 5/5; however, the patient  would not give good effort.  Sensation is intact.  The patient refuses to  ambulate.  SKIN:  Surgical wound on back.  Steri-Strips are in place.  There is no  erythema.  There is no induration.  There is no drainage.  PSYCHIATRIC:  Insight is poor.  Judgment is impaired.  Mood is depressed and  tearful.  She will only make intermittent eye contact.  She appears very  paranoid and suspicious and would only speak 1-2 words and only directly to  her nurse which she appears to intermittently recognize.  Question if she is  recognizing internal stimuli.  Of note, there is a notebook next to her  bedside which is filled with one sentence which reads Dr. Jeanie Sewer is  psychiatrist.   LABORATORY VALUES:  Blood cultures x 2 are pending.  UA is pending.  WBC  7.0, hemoglobin 11.3, hematocrit 33.0, platelets 641.  Sodium 131, potassium  3.6, chloride 95, CO2 26, BUN 6, creatinine 0.7, calcium 9.3, glucose 81.   ASSESSMENT:  This is a 65 year old female, status post laminectomy for  evacuation of epidural abscess with sudden onset of mental status changes.   PLAN:  1.  Altered mental status.  I feel that this lady's changes in mental status      appear to represent a delirium; however, I would feel there is a need to      rule out organic causes.  I recommend a cath UA; however, given that the      patient is on Ancef, I feel a UTI is less likely.  We will follow blood      cultures.  I agree with the rehab physician's orders to re-image her     head and back for possible recurrence of  infection.  However, this is      less likely given her non-ill appearance and lack of fever.  I feel this  patient likely has a delirium, however, what is interesting is that she      is relatively low risk for delirium given her age and lack of other co-      morbidities and polypharmacy and lack of other previous psych disorders.      However, she has the classic findings consistent with delirium including      sudden onset, disturbance of consciousness, cognitive changes,      fluctuating severity, changes in sleep wake cycles, mood lability, and      possible hallucinations.  There are no major drugs on her MEMR that may      be possibly causing this other than her prednisone for which she has      been taking chronically for years and therefore, I doubt that this is      the cause.  Electrolyte abnormalities can cause delirium and the      patient's sodium is decreased to 131 over the past several days;      however, I would expect her sodium to be much lower before it would      cause a delirium of this extent.  I will gently hydrate the patient and      check a FENA however.  MIs can also cause delirium and since she is      postoperative, I will check an EKG.  She has been on Reglan in the past      and it has been known to cause delirium; however, she has not received      this since May 02, 2004.  The patient does have slightly      protuberant eyes, so I will check a TSH as it too can cause delirium.  I      will also check a B-12 level.  Given that the patient has had recent      epidural abscess, we need to consider meningitis in the differential.      This may be seen on her MRI, however, if her delirium does not resolve      may need to consider a spinal tap.  However, given the patient's non-ill      appearance I strongly doubt that the patient has meningitis.  She also      does not complain of headaches nor does she have fevers.  This may also      represent a  true psychosis; however, the patient has no past psychiatric      history.  We may need to consider starting Seroquel if all other organic      workup is negative.  Other considerations include a possible inadvertent      administration of narcotics as she has had very similar symptom      manifestations when she has received narcotics in the past.  2.  Hyponatremia.  Unsure of cause.  She is not currently on any medications      that would cause this.  It may be secondary to decreased p.o. intake as      she has not been eating lately.  I will check a FENA and will give her a      500 cc bolus of normal saline.  3.  Epidural abscess.  We will continue her on Ancef and check an MRI for      recurrence.  Continue rehab if the patient will cooperate.  4.  Rehab is per Dr. Thomasena Edis.  5.  Hypertension.  She is stable on Avapro.  6.  Rheumatoid arthritis.  Stable on prednisone and Plaquenil.  7.  GERD.  Continue Protonix. 8.  DVT prophylaxis.  If the patient is no longer going to ambulate as she      has not for the past several days, considering she is post-op she is at      increased risk for DVT, we will need to place the patient on  __________      .  9.  Hypokalemia.  I question why the patient is on K-Dur.  She is not on      HCTZ; and she is not currently hypokalemic on current labs but is on      potassium supplementation.  I will leave the potassium on for now and      check with the primary team for further recommendations with regards to      her potassium supplementation.       SJ/MEDQ  D:  05/07/2004  T:  05/07/2004  Job:  [id number]

## 2010-12-23 NOTE — Discharge Summary (Signed)
Sabrina Adams, Sabrina Adams NO.:  [id number]   MEDICAL RECORD NO.:  [id number]          PATIENT TYPE:  INP   LOCATION:  5010                         FACILITY:  [facility]   PHYSICIAN:  Sharin Grave, MD  DATE OF BIRTH:  1946/02/10   DATE OF ADMISSION:  05/05/2004  DATE OF DISCHARGE:  05/07/2004                                 DISCHARGE SUMMARY   DISCHARGE DIAGNOSES:  1.  Altered mental status.  2.  Rheumatoid arthritis.  3.  Hypertension.  4.  Gastroesophageal reflux disease.  5.  Subdural empyema.   DISCHARGE MEDICATIONS:  1.  Prednisone 7.5 mg daily.  2.  Aspirin 81 mg daily.  3.  Avapro 300 mg daily.  4.  Tylenol 650 mg q.4-6 h. p.r.n. for pain.  5.  Senokot-S 2 tabs at bedtime p.r.n. for constipation.  6.  Plaquenil 200 mg b.i.d.  7.  Prilosec 40 mg daily.  8.  Mobic 7.5 mg daily.  9.  Ancef 2 grams IV q.8 h. until June 04, 2004.   DISCHARGE INSTRUCTIONS:  The patient has been instructed to keep incision  clean and dry.  [facility] is being scheduled to go home and have a  registered nurse to provide antibiotics for Sabrina Adams.  She is also to  have home health aide.  The patient has been instructed to follow up with  Dr. Lilyan Punt, who is her primary M.D. in [place], and with Dr. Chase Picket, who is her rheumatologist in [facility] in two to four weeks  after discharge.   Sabrina Adams is a 65 year old female with a history of rheumatoid  arthritis, hypertension, and GERD, who was initially with bacteremia and  epidural abscess and subdural empyema that was confirmed by a spinal CT scan  and MRI.  The patient underwent a thoracolumbar laminectomy for evacuation  of abscess on April 23, 2004.  She was placed on Ancef IV 2 grams q.8  h., and was referred to the [facility] for rehab treatment before discharge.  On  the night prior to discharge, the patient began to have episodes of altered  mental status characterized by confusion  and was refusing to ambulate and  was not oriented, not recognizing staff, and unwilling to respond to  questions and provide history about herself.  The patient has had a history  of similar episodes in the past in response to narcotics such as Demerol,  Vicodin, and Percocet.  However, it was believed that she had not received  any of these medications during admission.  On the next day after this  episode, the patient was feeling well.  She was alert and oriented to time,  space, and person, and responding to questions.  She reports that she slept  well, and she had a vague recollection of the previous night's events.  It  was noted that this patient had a compulsive personality, and it was  believed that these events could be a transient psychosis related to stress  and a predisposed personality.  Her labs on May 06, 2004:  Her  urinalysis was within normal limits.  White blood cell count was 6.9,  hemoglobin 11.9, hematocrit 34.4, and platelets 557.  Electrolytes as  follows:  Sodium 134, potassium 3.9, chloride 101, CO2 28, urea 7,  creatinine 0.7, glucose 84, calcium 9.4.  TSH 2.634, ammonia 18.  On  discharge date, on May 07, 2004 the patient was feeling well, wanting to  go home, and was having good p.o., no overnight events, and no pain,  afebrile, no headache, no nausea or vomiting, normal bowel movements, and  ambulating with assistance.  Her temperature was 97.9; heart rate 84;  respiratory rate 18; blood pressure 128/62; O2 saturation 98% on room air.  She was alert and oriented x 3, pleasant, cooperative, and coherent.  Cardiovascular:  Regular rate and rhythm.  No murmurs or rubs.  Lungs were  clear to auscultation bilaterally.  Abdomen was soft, nontender,  nondistended.  She had positive bowel movements.  Extremities:  No edema.  Neurologic:  No focalizations.  Her incision was clean and dry.   HOSPITAL COURSE:  1.  Altered mental status was resolved.  The  patient has not had any more      symptoms of confusion in the last 36 hours by admission.  She had an MRI      that showed no acute changes, so the patient was at baseline coherent,      had a Mini-Mental Score of 30, and it was believed that this episode of      confusion was likely a transient event related to narcotics or stressors      and a predisposed compulsive personality.  2.  Epidural abscess.  The patient continued afebrile.  Her last white blood      cell count was within normal limits.  The patient continued on IV Ancef,      and is to continue to have IV Ancef 2 grams q.8 h. at home.  The patient      has a PICC line placed, and is going to have home registered nurse      service to continue with this regime until June 04, 2004.  3.  Rehab.  Arrangements have been made for the patient to continue physical      therapy at home.  4.  Rheumatoid arthritis.  The patient was stable on home regime.  5.  GERD.  The patient was stable and asymptomatic on the date of discharge.      She was discharged with a prescription for Protonix.   The patient was discharged home in stable condition, to have physical  therapy at home, and registered nurse to continue her IV antibiotics until  June 04, 2004.  She is to follow up with primary M.D. and rheumatologist  in two to three weeks after discharge.       AM/MEDQ  D:  07/27/2004  T:  07/28/2004  Job:  [id number]

## 2010-12-23 NOTE — H&P (Signed)
NAME:  Sabrina Adams, Sabrina Adams NO.:  [id number]   MEDICAL RECORD NO.:  [id number]                   PATIENT TYPE:  INP   LOCATION:  2102                                 FACILITY:  [facility]   PHYSICIAN:  Asencion Partridge, M.D.                  DATE OF BIRTH:  [birth year]   DATE OF ADMISSION:  04/22/2004  DATE OF DISCHARGE:                                HISTORY & PHYSICAL   CHIEF COMPLAINT:  Subdural empyema and epidural spinal abscess.   HISTORY OF PRESENT ILLNESS:  This is a 65 year old white female with history  of rheumatoid arthritis and hypertension, who is now being transferred from  [facility] for [facility]surgery consult.  The patient was initially admitted to  [facility] on September 14 for low back pain and abdominal pain.  It was  initially felt that the patient had diverticulitis and was being treated  with Cipro and Flagyl.  This was initially felt considering the patient had  CT of the abdomen and inflammation of the sigmoid colon was shown.  In the  meantime the patient was not getting any better.  White blood cell count was  rising up to a maximum of 17,000.  Blood cultures were obtained, which were  positive for gram-positive cocci, and again the patient was not improving  and continued to be febrile; therefore, imaging was repeated, which revealed  a growing epidural abscess and subdural empyema.  At that time they began to  arrange transfer to [facility].  Of note, the patient had not been eating  and remained in a large amount of pain.  She has persistent nausea.  The  patient is also narcotic-intolerance and is therefore having lots of nausea  and vomiting with even small doses of narcotics.   REVIEW OF SYSTEMS:  Otherwise negative.   ALLERGIES:  Again, NARCOTIC intolerance, including CODEINE, VICODIN, and  DEMEROL.   MEDICATIONS:  Patient on at home:   1.  Plaquenil 200 mg b.i.d.  2.  Enbrel twice a week.  3.  Prednisone 7.5 mg daily.  4.   Diovan 160 mg daily.  5.  Calcium.  6.  Aspirin.  7.  Nexium 40 mg daily.   At [facility], the patient was on Plaquenil, prednisone, equivalent of  Diovan, calcium, aspirin, and Protonix b.i.d. along with Cipro and Flagyl  and a fentanyl patch for pain control.   PAST MEDICAL HISTORY:  1.  Rheumatoid arthritis.  2.  Hypertension.  3.  Gastroesophageal reflux.  4.  Patient is status post appendectomy.  5.  Status post oophorectomy.  6.  Has a history of diverticula.  7.  Hysterectomy in May 2005.   FAMILY HISTORY:  Significant for hypertension, coronary artery disease, and  CVA.   SOCIAL HISTORY:  The patient is married and lives in [place].  Has one  child.  No tobacco, alcohol, or drug use.   PHYSICAL  EXAMINATION:  VITAL SIGNS:  Vitals reviewed and stable prior to  going to the OR.  GENERAL:  Alert, in an apparent large amount of pain, being prepped for the  OR.   Majority of physical exam not done considering they were preparing to take  the patient to the OR at the time of interview.   CARDIOVASCULAR:  Regular rate and rhythm, no murmurs, rubs, or gallops.  LUNGS:  Clear to auscultation bilaterally.  ABDOMEN:  Positive bowel sounds, soft, nontender, nondistended.  MUSCULOSKELETAL:  Marked thoracolumbar tenderness to palpation.  NEUROLOGIC:  There were no focal deficits.  Patient moving all extremities.  EXTREMITIES:  No peripheral edema and 2+ peripheral pulses.   LABORATORY DATA:  From September 16 in the a.m. at [facility], white blood  cell count 15.5, hemoglobin 9.5, hematocrit 27.5, platelet count of 203, 88%  neutrophils, with an absolute neutrophil count of 13.6.  The day prior on  September 15, sodium 131, potassium 4.2, chloride of 99, bicarb of 25, BUN  of 18, creatinine of 1.2, and glucose of 146.  Repeat MRI on September 16  showed facet infection from L2 to L5 with associated epidural abscess,  subdural empyema extending from L3 up to T12 and exerting  mass effect on the  conus medullaris.   ASSESSMENT AND PLAN:  A 65 year old female with epidural/subdural abscess,  rheumatoid arthritis, hypertension, and gastroesophageal reflux disease.   1.  Paraspinal abscess.  The patient is to go to the OR tonight.  Will      attempt adequate control with morphine PCA postop.  The patient has a      history of narcotic intolerance associated with nausea and vomiting and      presyncope.  Will give antiemetic prior to pain medications and schedule      as long as the patient is on pain management.  Will also begin with      lower doses of pain medication for this reason.  2.  Rheumatoid arthritis.  Will continue the patient's home regimen.  3.  Hypertension.  Will continue ARB during hospitalization.  4.  Staphylococcus aureus bacteremia.  Sensitivities are not back for      cultures and considering the nature of the patient's infection, will      begin vancomycin per pharmacy.  5.  Gastroesophageal reflux.  Will continue Protonix b.i.d. during      hospitalization.      AD/MEDQ  D:  04/22/2004  T:  04/23/2004  Job:  [id number]   cc:   Lorin Picket A. Gerda Diss, M.D.  [address], [address]  [place]  [place] [address]  Fax: [phone]

## 2010-12-23 NOTE — H&P (Signed)
NAME:  Sabrina Adams, Sabrina Adams NO.:  [id number]   MEDICAL RECORD NO.:  [id number]          PATIENT TYPE:  INP   LOCATION:  1614                         FACILITY:  [facility]   PHYSICIAN:  Lebron Conners, M.D.   DATE OF BIRTH:  1946-06-11   DATE OF ADMISSION:  04/06/2006  DATE OF DISCHARGE:  04/14/2006                                HISTORY & PHYSICAL   CHIEF COMPLAINT:  Colostomy.   PRESENT ILLNESS:  The patient is a 65 year old white female who is about 5  months status post colostomy and sigmoid resection for diverticulitis.  There was abscess present.  The postop course was complicated by wound  infection which had healed up.  She presented for closure of colostomy and  was found to have no persistent severe colonic abnormalities on preoperative  evaluation.  She took an outpatient bowel prep and was admitted for surgery.   PAST MEDICAL HISTORY:  The patient has rheumatoid arthritis and takes  steroids.  She has had a hysterectomy and she had an epidural spinal abscess  caused by MRSA.  There is history of fractured ankle.   MEDICINES:  Prednisone, oral Dilaudid, Phenergan, aspirin, Plaquenil,  Neurontin, Diovan, and Mobic.   She has high blood pressure which is well-controlled.  She reports  INTOLERANCE MOST ORAL NARCOTIC MEDICINES.   REVIEW OF SYSTEMS:  Unremarkable beyond the above.   Family history and childhood illnesses unremarkable.   PHYSICAL EXAMINATION:  Temperature and vital signs per nursing staff.  No  acute distress.  HEAD AND NECK:  Exam normal with no enlargement of thyroid or mass.  CHEST: Clear to auscultation.  HEART:  Rate and rhythm normal.  No murmur or gallop.  ABDOMEN: Functional colostomy healed midline incision.  No masses.  No  hernias.  EXTREMITIES:  Rheumatoid deformities.  No skin lesions.  Good pulses.  NEUROLOGICAL:  Grossly normal.  LYMPH NODES:  Not enlarged in the groin.   IMPRESSION:  1.  Colostomy status.  2.   Hypertension.  3.  Rheumatoid arthritis, steroid dependent.   PLAN:  Colostomy closure.      Lebron Conners, M.D.  Electronically Signed     WB/MEDQ  D:  04/24/2006  T:  04/24/2006  Job:  [id number]

## 2010-12-23 NOTE — Consult Note (Signed)
NAME:  Sabrina Adams, Sabrina Adams NO.:  [id number]   MEDICAL RECORD NO.:  [id number]          PATIENT TYPE:  INP   LOCATION:  0102                         [facility]ACILITY:  [facility]   PHYSICIAN:  Deirdre Peer. Polite, M.D. DATE OF BIRTH:  [birth year]   DATE OF CONSULTATION:  10/03/2005  DATE OF DISCHARGE:                                   CONSULTATION   CHIEF COMPLAINT:  Abdominal pain.   HISTORY OF PRESENT ILLNESS:  A 65 year old female with a known history of  diverticulitis with associated pelvic abscess, rheumatoid arthritis on  chronic steroids and hypertension who presents to the ED for evaluation of  abdominal pain.  The patient was seen by her primary M.D. in [place], Dr.  Gerda Diss.  The patient conversed with Dr. Orson Slick, who recommended sending the  patient to the ED for further evaluation.  In he ED, the patient was  evaluated with a CT of the abdomen which showed a 6.2 cm posterior pelvic  abscess and moderate large colonic stool.  Labs show leukocytosis, and the  patient is febrile.  Admission is recommended for further evaluation.  Surgery is requested on consult for further assistance with medical  management.   PAST MEDICAL HISTORY:  As noted above.   MEDICATIONS ON ADMISSION:  1.  Prednisone 10 mg daily greater than 20 years.  2.  Dilaudid 2 mg q.4h. p.r.n.  3.  Phenergan 25 mg p.r.n.  4.  Aspirin daily, which she states she has not been taking since early in      January.  5.  Plaquenil 200 mg b.i.d.  6.  Neurontin 300 mg b.i.d.  7.  Diovan/hydrochlorothiazide 160 mg/12.5.  8.  Mobic daily.   SOCIAL HISTORY:  Negative for tobacco, alcohol or drugs.   PAST MEDICAL/SURGICAL HISTORY:  1.  Significant for hysterectomy in May of 2005.  2.  The patient suffered a staph infection _________ in 2005.  3.  The patient had a missed abortion.  4.  Fracture of the left ankle earlier this year.   FAMILY HISTORY:  Noncontributory.   ALLERGIES:  Reports allergies to MOST  NARCOTICS, PERCOCET and LORCET.  The  patient states she can take Dilaudid if _________ is given along with it.   PHYSICAL EXAMINATION:  GENERAL:  The patient is alert and oriented x3.  VITAL SIGNS:  T current 99.9, T max 100.3, blood pressure 116/61, pulse 110,  O2 saturation 94%.  HEENT:  Significant for proptosis, which is chronic, per patient.  Otherwise, within normal limits.  CHEST:  Clear to auscultation.  CARDIOVASCULAR:  Regular.  ABDOMEN:  Soft.  Positive bowel sounds.  Diffuse tenderness in the lower  quadrant.  EXTREMITIES:  No clubbing, cyanosis, or edema.  NEUROLOGIC:  Nonfocal.   DATA:  CT of the abdomen significant for moderate enlarged colon stool.  Also showed a 6.2 cm posterior pelvic mass.  CBC revealed a white count of  18.5, hemoglobin 9.8, MCV 88, platelets 716, neutrophil count 84%.  UA  within normal limits.  BMET revealed a potassium of 3.5, chloride 1.0.  LFT's within normal limits.  Lipase 22.  ASSESSMENT:  1.  Pelvic abscess, which will be managed by surgery.  2.  Rheumatoid arthritis on chronic steroids.  3.  Hypertension.  4.  History of diverticulitis with abscess.   RECOMMENDATIONS:  Recommend the patient be admitted to a medicine floor bed  for evaluation and treatment of pelvic abscess, which will be drained by  surgery.  I recommend continuing IV fluids, IV antibiotics, NPO.  As the  patient is chronically on steroids, recommend a stress dose of steroids at  this time.  Will make further recommendations as deemed necessary.      Deirdre Peer. Polite, M.D.  Electronically Signed     RDP/MEDQ  D:  10/03/2005  T:  10/03/2005  Job:  [id number]

## 2010-12-23 NOTE — Op Note (Signed)
NAME:  Sabrina Adams, RAYBURN NO.:  [id number]   MEDICAL RECORD NO.:  [id number]          PATIENT TYPE:  INP   LOCATION:  1614                         FACILITY:  [facility]   PHYSICIAN:  Lebron Conners, M.D.   DATE OF BIRTH:  July 12, 1946   DATE OF PROCEDURE:  04/06/2006  DATE OF DISCHARGE:                                 OPERATIVE REPORT   PREOPERATIVE DIAGNOSIS:  Colostomy status.   POSTOPERATIVE DIAGNOSIS:  Colostomy status.   OPERATION:  Closure of colostomy.   SURGEON:  Lebron Conners, M.D.   ASSISTANT:  Anselm Pancoast. Zachery Dakins, M.D.  Lianne Bushy, M.D.   PROCEDURE:  After the patient was monitored and asleep and had a Foley  catheter and was positioned with the yellow thin stirrups, the legs extended  and slightly flexed at the hip, and after routine preparation and draping of  the abdomen and perineum, I made a midline incision in the lower abdomen,  removing the old skin scar.  I carefully entered the abdominal cavity and  found that there were very few adhesions of viscera to the anterior  abdominal wall.  I mobilized the small bowel out of the pelvis and found  that there were a couple of loops which were adherent in the pelvis and I  very carefully dissected those free, taking care to avoid injury.  I could  see the end of the rectal stump with a couple of blue sutures attached and I  dissected that free a little bit, I made sure it was free of the bladder and  free of all of the retroperitoneal vessels and both ureters.  I then made an  incision around the colostomy and dissected down through the subcutaneous  tissues to the fascia and incised the peritoneum and fascia at that level  and put the colostomy back inside.  Some small bowel was adherent to the  colostomy and I dissected that away.  I accidentally made one small  seromuscular cut on a loop of small bowel and I repaired that with three  sutures of 3-0 silk.  I felt that the integrity was good.  I found  one  diverticulum just proximal to the colostomy and I dissected the mesentery at  that point above that so that I was resecting that diverticulum.  I incised  the peritoneum along the lateral left gutter the white line of [place] in  order to bring the proximal bowel down.  I found that I had good length.  I  elected a site of resection.  I placed a 2-0 Prolene pursestring suture  using the pursestring device and resected the colostomy and handed it off.  The pursestring appeared to be adequate.  I calibrated the proximal bowel  and found that it would accept a 29 mm stapler.  I brought the stapler onto  the field and I put the anvil into the proximal bowel and tied the  pursestring suture and was satisfied with the arrangement.  Dr. Zachery Dakins  then went down to the perineum and ascertained a good level of the  anastomosis using the proctoscope and then passed  the Autosuture end-to-end  anastomotic device in and opened the post and I made a small hole in the  anterior surface of the rectum and the post came through nicely.  I attached  the anvil and then observed the bowel as it went down be sure it did not  twist or kink.  We then fired the pursestring device and created a nice  anastomosis.  The device came out easily.  We inspected the anastomosis from  below and found that it appeared to be wide open and adequate.  Insufflation  of the bowel with air disclosed no leak.  I was satisfied with the  vascularity.  Hemostasis was good in the pelvis.  I replaced the small bowel  into the pelvis and checked for hemostasis in the left gutter and found that  was good.  I removed the  packs and we got a correct sponge, needle and instrument count.  I closed  the midline fascia with running #1 PDS and also closed the colostomy site  with one layer in the fascia using running #1 PDS.  After irrigating the  subcutaneous tissues, we closed the skin of both incisions with staples.  The patient was  stable throughout.      Lebron Conners, M.D.  Electronically Signed     WB/MEDQ  D:  04/06/2006  T:  04/06/2006  Job:  [id number]   cc:   Lorin Picket A. Gerda Diss, MD  Fax: [phone]

## 2010-12-23 NOTE — Discharge Summary (Signed)
NAME:  Sabrina Adams, VANNEST NO.:  [id number]   MEDICAL RECORD NO.:  [id number]          PATIENT TYPE:  INP   LOCATION:  1614                         FACILITY:  [facility]   PHYSICIAN:  Lebron Conners, M.D.   DATE OF BIRTH:  01-06-1946   DATE OF ADMISSION:  04/06/2006  DATE OF DISCHARGE:  04/14/2006                                 DISCHARGE SUMMARY   HISTORY:  Ms. Greenwood is a 65 year old white female who had a colostomy and  sigmoid resection for perforated diverticulitis in March.  She was admitted  on 04/06/2006 after bowel prep at home for colostomy closure.  See history  and physical for details.  Past medical history is significant for  rheumatoid arthritis and hypertension.  She is on steroids.  Physical exam  was unremarkable except for rheumatoid deformities and for colostomy in left  mid abdomen and healed midline abdominal incision.   HOSPITAL COURSE:  On the day of admission the patient underwent colostomy  closure with resection of the colostomy and anastomosis.  Postoperatively  she did generally well with a slow return of GI function.  She was given a  boost in steroids because of concern that she might have adrenal  suppression.  She was given anticoagulation with Lovenox and had minor wound  bleeding, so Lovenox was stopped.  She gradually improved.  There was a  hematoma in the wound but it was packed with gauze and did not appear to be  infected.  She was sent home on April 14, 2006 eating well and bowels  moving well and arrangements made for follow-up in the office with me in  about a week.   DIAGNOSES:  1. Colostomy status.  2. History of diverticulitis with perforation.  3. Rheumatoid arthritis.  4. Hypertension.   OPERATION:  Closure of colostomy.   DISCHARGE CONDITION:  Improved.      Lebron Conners, M.D.  Electronically Signed     WB/MEDQ  D:  04/24/2006  T:  04/25/2006  Job:  [id number]

## 2010-12-23 NOTE — H&P (Signed)
NAME:  Sabrina Adams, Sabrina Adams NO.:  [id number]   MEDICAL RECORD NO.:  [id number]          PATIENT TYPE:  INP   LOCATION:  1315                         [facility]ACILITY:  [facility]   PHYSICIAN:  Ollen Gross. Vernell Morgans, M.D. DATE OF BIRTH:  09-Aug-1945   DATE OF ADMISSION:  10/02/2005  DATE OF DISCHARGE:                                HISTORY & PHYSICAL   [name] is a 65 year old white female who presents to the emergency  department with lower abdominal pain over the last 3 days. The pain has  worsened during these last 3 days. She has had some fevers at home. She  denies any nausea or vomiting. She has not really had any diarrhea. She does  have a history of diverticulitis that was evaluated and managed by Dr.  Orson Slick back in November. She has been seeing him fairly regularly and was  scheduled to see him again in January at which time she fell and broke her  ankle and was unable to follow up with him in the clinic. Since that time  she was put on pain medicine for her ankle and she feels as though this pain  medicine disrupted her bowel function enough to cause this recurrence. She  otherwise denies any chest pain, shortness of breath, diarrhea, dysuria. She  did notice some blood in her stool about a month ago but this resolved.   PAST MEDICAL HISTORY:  Significant for:  1.  Diverticulitis.  2.  Gastroesophageal reflux with gastric ulcers.  3.  Rheumatoid arthritis.   PAST SURGICAL HISTORY:  Significant for hysterectomy and bilateral  oophorectomy and an exploration for an ovarian cyst, as well as back surgery  complicated by a staph infection.   MEDICATIONS:  Include:  1.  Acetaminophen.  2.  Prednisone 7.5 mg a day.  3.  Baby aspirin a day.  4.  Plaquenil 200 mg twice a day.  5.  Nexium.  6.  Diovan.  7.  Hydrochlorothiazide.  8.  Neurontin.  9.  Mobic.  10. Vitamin B.  11. Colace.  12. Calcium carbonate.  13. Multivitamin.   ALLERGIES:  CODEINE and PERCOCET.   SOCIAL HISTORY:  She denies the use of alcohol or tobacco products.   FAMILY HISTORY:  Noncontributory.   PHYSICAL EXAMINATION:  VITAL SIGNS:  Her temperature is 101.3, blood  pressure 116/61, pulse of 110.  GENERAL:  She is a well-developed elderly white female in no acute distress.  SKIN:  Warm and dry, no jaundice.  EYES:  Her extraocular muscles are intact. Pupils equal, round, and reactive  to light. Sclerae nonicteric.  LUNGS:  Clear bilaterally with no use of accessory respiratory muscles.  HEART:  Has a regular rate and rhythm with an impulse in the left chest.  ABDOMEN:  Soft in the upper abdomen with no tenderness. She does have some  moderate lower abdominal tenderness but no peritonitis. She has normal bowel  sounds. No palpable mass or hepatosplenomegaly.  EXTREMITIES:  She has some rheumatoid arthritis changes with good strength  in her arms and legs.  PSYCHOLOGICALLY:  She is alert and oriented  x3 with no evidence today of  anxiety or depression.   On review of her lab work she was noted to have a white count of 18,500;  hemoglobin was 9.9; platelet count 716,000. On reviewing her CT scan, she  was noted to have a recurrent pelvic abscess with a lot of stool in her  colon.   ASSESSMENT AND PLAN:  This is a 65 year old white female with significant  rheumatoid arthritis who appears to have had a recurrence of a pelvic  abscess. She apparently had a pelvic abscess with her recent episode of  diverticulitis back in November that did not require drainage and improved  on antibiotics. Given how well her abdomen feels on exam today I think it  would be reasonable to start her on broad spectrum antibiotics and watch her  closely. Certainly her steroids can complicate her picture, and if she does  not appear to be improving over the next day or so then she may need  exploration with a colostomy and partial colectomy of the involved area. If  she does improve, we will continue  her broad spectrum antibiotics and review  her CT scan with the interventional radiologist to see if this fluid  collection is drainable. We will also discuss this with Dr. Orson Slick. We have  also asked the internal medicine doctors to see her with [place] to help [place]  manage her rheumatoid arthritis.      Ollen Gross. Vernell Morgans, M.D.  Electronically Signed     PST/MEDQ  D:  10/03/2005  T:  10/03/2005  Job:  [id number]

## 2011-01-03 ENCOUNTER — Other Ambulatory Visit: Payer: Self-pay | Admitting: Family Medicine

## 2011-01-03 DIAGNOSIS — Z139 Encounter for screening, unspecified: Secondary | ICD-10-CM

## 2011-01-10 ENCOUNTER — Ambulatory Visit (HOSPITAL_COMMUNITY)
Admission: RE | Admit: 2011-01-10 | Discharge: 2011-01-10 | Disposition: A | Discharge status: Home / Self Care | Payer: Medicare Other | Source: Ambulatory Visit | Attending: Family Medicine | Admitting: Family Medicine

## 2011-01-10 DIAGNOSIS — Z1231 Encounter for screening mammogram for malignant neoplasm of breast: Secondary | ICD-10-CM | POA: Insufficient documentation

## 2011-01-10 DIAGNOSIS — Z139 Encounter for screening, unspecified: Secondary | ICD-10-CM

## 2011-01-19 ENCOUNTER — Ambulatory Visit (HOSPITAL_COMMUNITY): Payer: Medicare Other | Attending: Rheumatology

## 2011-01-19 DIAGNOSIS — M810 Age-related osteoporosis without current pathological fracture: Secondary | ICD-10-CM | POA: Insufficient documentation

## 2011-12-07 ENCOUNTER — Other Ambulatory Visit: Payer: Self-pay | Admitting: Family Medicine

## 2011-12-07 DIAGNOSIS — Z139 Encounter for screening, unspecified: Secondary | ICD-10-CM

## 2012-01-11 ENCOUNTER — Ambulatory Visit (HOSPITAL_COMMUNITY)
Admission: RE | Admit: 2012-01-11 | Discharge: 2012-01-11 | Disposition: A | Discharge status: Home / Self Care | Payer: Medicare Other | Source: Ambulatory Visit | Attending: Family Medicine | Admitting: Family Medicine

## 2012-01-11 DIAGNOSIS — Z139 Encounter for screening, unspecified: Secondary | ICD-10-CM

## 2012-01-11 DIAGNOSIS — Z1231 Encounter for screening mammogram for malignant neoplasm of breast: Secondary | ICD-10-CM | POA: Insufficient documentation

## 2012-01-19 ENCOUNTER — Other Ambulatory Visit (HOSPITAL_COMMUNITY): Payer: Self-pay | Admitting: *Deleted

## 2012-01-23 ENCOUNTER — Encounter (HOSPITAL_COMMUNITY)
Admission: RE | Admit: 2012-01-23 | Discharge: 2012-01-23 | Disposition: A | Discharge status: Home / Self Care | Payer: Medicare Other | Source: Ambulatory Visit | Attending: Rheumatology | Admitting: Rheumatology

## 2012-01-23 DIAGNOSIS — M810 Age-related osteoporosis without current pathological fracture: Secondary | ICD-10-CM | POA: Insufficient documentation

## 2012-01-23 MED ORDER — ZOLEDRONIC ACID 5 MG/100ML IV SOLN
5.0000 mg | Freq: Once | INTRAVENOUS | Status: AC
  Administered 2012-01-23: 5 mg via INTRAVENOUS
  Filled 2012-01-23: qty 100

## 2012-04-01 ENCOUNTER — Encounter (HOSPITAL_COMMUNITY): Payer: Self-pay | Admitting: Pharmacy Technician

## 2012-04-01 ENCOUNTER — Other Ambulatory Visit (INDEPENDENT_AMBULATORY_CARE_PROVIDER_SITE_OTHER): Payer: Self-pay | Admitting: *Deleted

## 2012-04-01 ENCOUNTER — Telehealth (INDEPENDENT_AMBULATORY_CARE_PROVIDER_SITE_OTHER): Payer: Self-pay | Admitting: *Deleted

## 2012-04-01 DIAGNOSIS — D509 Iron deficiency anemia, unspecified: Secondary | ICD-10-CM

## 2012-04-01 DIAGNOSIS — K922 Gastrointestinal hemorrhage, unspecified: Secondary | ICD-10-CM

## 2012-04-01 DIAGNOSIS — Z8719 Personal history of other diseases of the digestive system: Secondary | ICD-10-CM

## 2012-04-01 MED ORDER — PEG-KCL-NACL-NASULF-NA ASC-C 100 G PO SOLR: 1.0000 | Freq: Once | ORAL | Status: DC

## 2012-04-01 NOTE — Telephone Encounter (Signed)
agree

## 2012-04-01 NOTE — Telephone Encounter (Signed)
PCP/Requesting MD: scott luking  Name & DOB: Sabrina Adams 2046/03/11     Procedure: tcs  Reason/Indication:  Gi bleed, iron def anemia, hx diverticulitis  Has patient had this procedure before?  yes  If so, when, by whom and where?    Is there a family history of colon cancer?  no  Who?  What age when diagnosed?    Is patient diabetic?   no      Does patient have prosthetic heart valve?  no  Do you have a pacemaker?  no  Has patient had joint replacement within last 12 months?  no  Is patient on Coumadin, Plavix and/or Aspirin? no  Medications: nexium 40 mg daily, levothyroxine 125 mcg 6 days a week then 100 mcg 1 day a week, gabapentin 300 mg 3-4 tabs daily, prednisone 7.5 mg daily, plaquanil 200 mg bid, amlodipine 10 mg daily, meloxicam 15 mg daily, methocarbamol 500 mg 1-2 tab daily, tramadol 50 mg 3-4 tab daily, phenergan 12.5 mg w/ each tramadol, extra strength tylenol 500 mg bid, arthritis tylenol 650 mg bid, vit d3 1000 units bid, colace 200 mg 2 tab bid, fiber tablet daily, b complex, calcium w/ vit d, multi vit, temazepam 30 mg prn, dilaudid 2 mg prn, prescription miralax, reclasp  IV once a year  Allergies: narcotics  Medication Adjustment:   Procedure date & time: 04/04/12 at 410

## 2012-04-03 ENCOUNTER — Encounter (INDEPENDENT_AMBULATORY_CARE_PROVIDER_SITE_OTHER): Payer: Self-pay

## 2012-04-03 MED ORDER — SODIUM CHLORIDE 0.45 % IV SOLN
INTRAVENOUS | Status: DC
  Administered 2012-04-04: 15:00:00 via INTRAVENOUS

## 2012-04-04 ENCOUNTER — Encounter (HOSPITAL_COMMUNITY): Payer: Self-pay | Admitting: *Deleted

## 2012-04-04 ENCOUNTER — Encounter (HOSPITAL_COMMUNITY)
Admission: RE | Disposition: A | Discharge status: Home / Self Care | Payer: Self-pay | Source: Ambulatory Visit | Attending: Internal Medicine

## 2012-04-04 ENCOUNTER — Ambulatory Visit (HOSPITAL_COMMUNITY)
Admission: RE | Admit: 2012-04-04 | Discharge: 2012-04-04 | Disposition: A | Discharge status: Home / Self Care | Payer: Medicare Other | Source: Ambulatory Visit | Attending: Internal Medicine | Admitting: Internal Medicine

## 2012-04-04 DIAGNOSIS — K633 Ulcer of intestine: Secondary | ICD-10-CM

## 2012-04-04 DIAGNOSIS — K5730 Diverticulosis of large intestine without perforation or abscess without bleeding: Secondary | ICD-10-CM

## 2012-04-04 DIAGNOSIS — K922 Gastrointestinal hemorrhage, unspecified: Secondary | ICD-10-CM

## 2012-04-04 DIAGNOSIS — I10 Essential (primary) hypertension: Secondary | ICD-10-CM | POA: Insufficient documentation

## 2012-04-04 DIAGNOSIS — K6389 Other specified diseases of intestine: Secondary | ICD-10-CM

## 2012-04-04 DIAGNOSIS — D509 Iron deficiency anemia, unspecified: Secondary | ICD-10-CM

## 2012-04-04 DIAGNOSIS — Z79899 Other long term (current) drug therapy: Secondary | ICD-10-CM | POA: Insufficient documentation

## 2012-04-04 DIAGNOSIS — K921 Melena: Secondary | ICD-10-CM | POA: Insufficient documentation

## 2012-04-04 DIAGNOSIS — Z8719 Personal history of other diseases of the digestive system: Secondary | ICD-10-CM

## 2012-04-04 HISTORY — DX: Unspecified osteoarthritis, unspecified site: M19.90

## 2012-04-04 HISTORY — DX: Myoneural disorder, unspecified: G70.9

## 2012-04-04 HISTORY — DX: Hypothyroidism, unspecified: E03.9

## 2012-04-04 HISTORY — DX: Other complications of anesthesia, initial encounter: T88.59XA

## 2012-04-04 HISTORY — DX: Anemia, unspecified: D64.9

## 2012-04-04 HISTORY — DX: Sleep apnea, unspecified: G47.30

## 2012-04-04 HISTORY — DX: Gastro-esophageal reflux disease without esophagitis: K21.9

## 2012-04-04 HISTORY — DX: Essential (primary) hypertension: I10

## 2012-04-04 HISTORY — DX: Adverse effect of unspecified anesthetic, initial encounter: T41.45XA

## 2012-04-04 HISTORY — PX: COLONOSCOPY: SHX5424

## 2012-04-04 HISTORY — PX: ESOPHAGOGASTRODUODENOSCOPY: SHX5428

## 2012-04-04 SURGERY — COLONOSCOPY
Anesthesia: Moderate Sedation

## 2012-04-04 MED ORDER — MIDAZOLAM HCL 5 MG/5ML IJ SOLN
INTRAMUSCULAR | Status: DC | PRN
  Administered 2012-04-04 (×2): 2 mg via INTRAVENOUS

## 2012-04-04 MED ORDER — MEPERIDINE HCL 100 MG/ML IJ SOLN
INTRAMUSCULAR | Status: AC
  Filled 2012-04-04: qty 2

## 2012-04-04 MED ORDER — MIDAZOLAM HCL 5 MG/5ML IJ SOLN
INTRAMUSCULAR | Status: AC
  Filled 2012-04-04: qty 10

## 2012-04-04 MED ORDER — FENTANYL CITRATE 0.05 MG/ML IJ SOLN
INTRAMUSCULAR | Status: AC
  Filled 2012-04-04: qty 2

## 2012-04-04 MED ORDER — FENTANYL CITRATE 0.05 MG/ML IJ SOLN
INTRAMUSCULAR | Status: DC | PRN
  Administered 2012-04-04: 25 ug via INTRAVENOUS

## 2012-04-04 MED ORDER — STERILE WATER FOR IRRIGATION IR SOLN
Status: DC | PRN
  Administered 2012-04-04: 16:00:00

## 2012-04-04 MED ORDER — MELOXICAM 15 MG PO TABS: 7.5000 mg | ORAL_TABLET | Freq: Every day | ORAL | Status: DC

## 2012-04-04 NOTE — Op Note (Signed)
COLONOSCOPY PROCEDURE REPORT  PATIENT:  Sabrina Adams  MR#:  [id number] Birthdate:  [birth year], 66 y.o., female Endoscopist:  Dr. Malissa Hippo, MD Referred By:  Dr. Lilyan Punt, M.D.  Procedure Date: 04/04/2012  Procedure:   Colonoscopy  Indications: Patient is 66 year old Caucasian female who had an episode of rectal bleeding 2 weeks ago at that she passed maroon-colored stools. He was evaluated by Dr. looking and noted to have Hgb of 11 g and iron studies consistent with iron deficiency anemia. Patient takes Mobic every day for arthritic symptoms. He has rheumatoid arthritis.  Informed Consent:  The procedure and risks were reviewed with the patient and informed consent was obtained.  Medications:  Fentanyl 25 mcg IV Versed 4 mg IV  Description of procedure:  After a digital rectal exam was performed, that colonoscope was advanced from the anus through the rectum and colon to the area of the cecum, ileocecal valve and appendiceal orifice. The cecum was deeply intubated. These structures were well-seen and photographed for the record. From the level of the cecum and ileocecal valve, the scope was slowly and cautiously withdrawn. The mucosal surfaces were carefully surveyed utilizing scope tip to flexion to facilitate fold flattening as needed. The scope was pulled down into the rectum where a thorough exam including retroflexion was performed.  Findings:   Prep satisfactory. Colo-rectal anastomosis at 10 cm from the anal margin. Scattered diverticula throughout the colon. Mucosal pigmentation involving the proximal half of the colon. Two ulcers in the region of splenic flexure. One was 4 cm long and the other one was smaller. Biopsy taken. Normal rectal mucosa. Small hemorrhoids below the dentate line.  Therapeutic/Diagnostic Maneuvers Performed:  See above.  Complications:  None  Cecal Withdrawal Time:  13 minutes  Impression:  Examination performed to cecum. Scattered  diverticula throughout the colon. Colorectal anastomosis at 10 cm from anal margin. Two ulcers at  splenic flexure. One ulcer was 4 cm long and the other also was smaller. Biopsy taken for routine histology. Suspect either ischemic injury or NSAID colopathy.  Recommendations:  Decrease Mobic to 7.5 mg by mouth daily. Flintstones with iron 2 tablets daily. Will contact patient with results of biopsy and further recommendations.  Wei Newbrough U  04/04/2012 6:35 PM  CC: Dr. Lilyan Punt, MD & Dr. Bonnetta Barry ref. provider found

## 2012-04-04 NOTE — H&P (Signed)
Sabrina Adams is an 66 y.o. female.   Chief Complaint: Patient is here for colonoscopy followed by EGD if colonoscopy is negative. HPI: Patient is 66 year old Caucasian female with complicated medical history including rheumatoid arthritis who experienced a an episode or rectal bleeding over 2 weeks ago when she passed maroon-colored stools. She was seen by Dr. Lorin Picket looking. Workup revealed iron deficiency anemia. Patient denies melena of her stools at times and dark. She denies nausea vomiting or hematemesis. Patient takes Mobic daily.  Past Medical History  Diagnosis Date  . Complication of anesthesia     nausea ,  psycotics episodes  . Hypertension   . Hypothyroidism   . Sleep apnea   . GERD (gastroesophageal reflux disease)   . Neuromuscular disorder     neuropathy  legs  . Arthritis     rheumatoid  . Anemia     Past Surgical History  Procedure Date  . Lumbar spine surgery 2005  . Abdominal hysterectomy 2005  . Colon surgery     colostomy then revision  . Hand arthroplasty   . Ankle surgery     History reviewed. No pertinent family history. Social History:  does not have a smoking history on file. She does not have any smokeless tobacco history on file. She reports that she drinks alcohol. Her drug history not on file.  Allergies:  Allergies  Allergen Reactions  . Betadine (Povidone Iodine) Itching    "Burns"  . Cinnamon Anaphylaxis    Throat and Tongue   . Codeine Nausea Only    Patient can become "psychotic"  . Adhesive (Tape) Other (See Comments)    Irritates skin   . Lidocaine     Medications Prior to Admission  Medication Sig Dispense Refill  . acetaminophen (TYLENOL) 500 MG tablet Take 1,000 mg by mouth 2 (two) times daily.      [name] amLODipine (NORVASC) 10 MG tablet Take 10 mg by mouth daily.      [name] b complex vitamins tablet Take 1 tablet by mouth daily.      . Calcium Carbonate-Vitamin D (CALCIUM 600 + D PO) Take 1 tablet by mouth 2 (two) times daily.       . cholecalciferol (VITAMIN D) 1000 UNITS tablet Take 1,000 Units by mouth 2 (two) times daily.      [name] docusate sodium (COLACE) 100 MG capsule Take 200 mg by mouth at bedtime.      [name] esomeprazole (NEXIUM) 40 MG capsule Take 40 mg by mouth daily before breakfast.      . gabapentin (NEURONTIN) 400 MG capsule Take 400 mg by mouth 4 (four) times daily as needed. Nerve Pain      . levothyroxine (SYNTHROID, LEVOTHROID) 125 MCG tablet Take 125 mcg by mouth daily. Every day besides on Monday.      . meloxicam (MOBIC) 15 MG tablet Take 15 mg by mouth daily.      . methocarbamol (ROBAXIN) 500 MG tablet Take 500 mg by mouth daily.      . peg 3350 powder (MOVIPREP) 100 G SOLR Take 1 kit (100 g total) by mouth once.  1 kit  0  . polyethylene glycol (MIRALAX / GLYCOLAX) packet Take 17 g by mouth daily as needed. Constipation      . predniSONE (DELTASONE) 5 MG tablet Take 7.5 mg by mouth daily.      . promethazine (PHENERGAN) 25 MG tablet Take 12.5 mg by mouth every 8 (eight) hours as needed.      [name]  temazepam (RESTORIL) 30 MG capsule Take 30 mg by mouth at bedtime as needed. Sleep      . traMADol (ULTRAM) 50 MG tablet Take 50 mg by mouth every 6 (six) hours as needed. Pain      . acetaminophen (TYLENOL) 650 MG CR tablet Take 1,300 mg by mouth 2 (two) times daily.      [name] HYDROmorphone (DILAUDID) 2 MG tablet Take 2 mg by mouth every 4 (four) hours as needed. Pain      . hydroxychloroquine (PLAQUENIL) 200 MG tablet Take 200 mg by mouth 2 (two) times daily.      [name] levothyroxine (SYNTHROID, LEVOTHROID) 100 MCG tablet Take 100 mcg by mouth once a week. Takes on Monday.      . Multiple Vitamin (MULTIVITAMIN WITH MINERALS) TABS Take 1 tablet by mouth daily.      . promethazine (PHENERGAN) 12.5 MG tablet Take 12.5 mg by mouth every 6 (six) hours as needed. Nausea and Vomiting      . psyllium (REGULOID) 0.52 G capsule Take 0.52 g by mouth daily.      . zoledronic acid (RECLAST) 5 MG/100ML SOLN Inject 5 mg into the  vein once.        No results found for this or any previous visit (from the past 48 hour(s)). No results found.  ROS  Blood pressure 162/82, pulse 85, temperature 98.8 F (37.1 C), temperature source Oral, resp. rate 20, height 5\' 6"  (1.676 m), weight 172 lb (78.019 kg), SpO2 94.00%. Physical Exam   Assessment/Plan History of rectal bleeding. Iron deficiency anemia. Colonoscopy to be followed by EGD if colonoscopy is negative.  Raymondo Garcialopez U 04/04/2012, 3:52 PM

## 2012-04-10 ENCOUNTER — Encounter (HOSPITAL_COMMUNITY): Payer: Self-pay | Admitting: Internal Medicine

## 2012-04-11 ENCOUNTER — Telehealth (INDEPENDENT_AMBULATORY_CARE_PROVIDER_SITE_OTHER): Payer: Self-pay | Admitting: *Deleted

## 2012-04-11 ENCOUNTER — Encounter (INDEPENDENT_AMBULATORY_CARE_PROVIDER_SITE_OTHER): Payer: Self-pay | Admitting: *Deleted

## 2012-04-11 DIAGNOSIS — K922 Gastrointestinal hemorrhage, unspecified: Secondary | ICD-10-CM

## 2012-04-11 NOTE — Telephone Encounter (Signed)
Per Dr.Rehman the patient will need to have a CBC in 8 weeks.

## 2012-05-02 ENCOUNTER — Encounter (INDEPENDENT_AMBULATORY_CARE_PROVIDER_SITE_OTHER): Payer: Self-pay | Admitting: *Deleted

## 2012-05-02 ENCOUNTER — Other Ambulatory Visit (INDEPENDENT_AMBULATORY_CARE_PROVIDER_SITE_OTHER): Payer: Self-pay | Admitting: *Deleted

## 2012-05-02 DIAGNOSIS — K922 Gastrointestinal hemorrhage, unspecified: Secondary | ICD-10-CM

## 2012-06-06 LAB — CBC
HCT: 40.4 % (ref 36.0–46.0)
Hemoglobin: 12.7 g/dL (ref 12.0–15.0)
MCHC: 31.4 g/dL (ref 30.0–36.0)
RBC: 4.78 MIL/uL (ref 3.87–5.11)

## 2012-12-20 ENCOUNTER — Ambulatory Visit (INDEPENDENT_AMBULATORY_CARE_PROVIDER_SITE_OTHER): Payer: Medicare Other | Admitting: Family Medicine

## 2012-12-20 ENCOUNTER — Encounter: Payer: Self-pay | Admitting: Family Medicine

## 2012-12-20 VITALS — BP 134/80 | HR 80 | Ht 64.0 in | Wt 186.4 lb

## 2012-12-20 DIAGNOSIS — M069 Rheumatoid arthritis, unspecified: Secondary | ICD-10-CM | POA: Insufficient documentation

## 2012-12-20 DIAGNOSIS — I10 Essential (primary) hypertension: Secondary | ICD-10-CM | POA: Insufficient documentation

## 2012-12-20 DIAGNOSIS — G894 Chronic pain syndrome: Secondary | ICD-10-CM

## 2012-12-20 MED ORDER — AMLODIPINE BESYLATE 10 MG PO TABS: 10.0000 mg | ORAL_TABLET | Freq: Every day | ORAL | Status: DC

## 2012-12-20 MED ORDER — PREDNISONE 5 MG PO TABS: 7.5000 mg | ORAL_TABLET | Freq: Every day | ORAL | Status: DC

## 2012-12-20 NOTE — Progress Notes (Signed)
  Subjective:    Patient ID: Sabrina Adams, female    DOB: 02/01/1946, 67 y.o.   MRN: [id number]  HPIShe relates she's been doing well with taking her pain medicine on regular basis she denies abusing it. It does help her function. She tolerates tramadol but she wishes she could use something stronger but she gets side effects with hydrocodone and Percocet she does use them on an intermittently She does try the healthy in stay physically active but she uses a wheelchair more than she used to. She is disabled. Family history noncontributory social does not smoke   Past medical history arthritis, hypertension,Chronic pain, hypothyroidism. Review of Systems     Objective:   Physical Exam Her lungs are clear. Heart is regular. No murmurs. Pulse normal. Extremities no edema. Significant arthritic changes are noted. She also has an area on the left lower lip that is approximately 1-2 mm appears to be a dark vein at the surface she stated that occurred after biting it.       Assessment & Plan:  Hypertension-Patient's blood pressure overall good continue current measures followup in 3-4 months Arthritis-Severe arthritis with moderate inflammatory issues as well as being on significant number of medications. She uses Tramadol 4  times daily as needed for Pain. The law but when necessary. She will followup with Dr. Dareen Piano on a regular basis Chronic pain- tramadol and dilaudidAs discussed above followup every 3-4 months. Patient does not abuse medication. Blood blister vs vein on lip-I. Don't feel anything needs to be done with this currently but if increasing size or other problems may need referral to ENT for removal 25 minutes spent in patient [phone]

## 2012-12-20 NOTE — Progress Notes (Signed)
  Subjective:    Patient ID: Sabrina Adams, female    DOB: June 15, 1946, 67 y.o.   MRN: [id number]  Arthritis Presents for follow-up visit. The symptoms have been stable. Her pain is at a severity of 5/10. Compliance with total regimen is 76-100%. Compliance with medications is 76-100%.      Review of Systems  Musculoskeletal: Positive for arthritis.       Objective:   Physical Exam        Assessment & Plan:

## 2013-01-01 ENCOUNTER — Encounter (INDEPENDENT_AMBULATORY_CARE_PROVIDER_SITE_OTHER): Payer: Self-pay | Admitting: Internal Medicine

## 2013-01-01 ENCOUNTER — Ambulatory Visit (INDEPENDENT_AMBULATORY_CARE_PROVIDER_SITE_OTHER): Payer: Medicare Other | Admitting: Internal Medicine

## 2013-01-01 VITALS — BP 134/62 | HR 68 | Ht 65.0 in | Wt 183.9 lb

## 2013-01-01 DIAGNOSIS — K219 Gastro-esophageal reflux disease without esophagitis: Secondary | ICD-10-CM

## 2013-01-01 NOTE — Progress Notes (Signed)
Subjective:     Patient ID: Sabrina Adams, female   DOB: 25-Aug-1945, 67 y.o.   MRN: [id number]  HPI Presents today with c/o acid reflux. She has terrible vomiting. Symptoms occur she goes to bed. Usually occurs after eating spicy foods.  Occurred about 1 1/2 weeks ago. This has occurred twice this year. Most of the time this occurs after eating spicy foods and caffeine. She has been taking Nexium for her acid reflux x 1 yr. Appetite is good. No weight loss. She usually has a BM once a day. Takes Miralax and a stool softner. No melena or bright red rectal bleeding.  04/04/2012 Colonoscopy for rectal bleeding:  Impression:  Examination performed to cecum.  Scattered diverticula throughout the colon.  Colorectal anastomosis at 10 cm from anal margin.  Two ulcers at splenic flexure. One ulcer was 4 cm long and the other also was smaller. Biopsy taken for routine histology.  Suspect either ischemic injury or NSAID colopathy.  Review of Systems see hpi Current Outpatient Prescriptions  Medication Sig Dispense Refill  . acetaminophen (TYLENOL) 500 MG tablet Take 1,000 mg by mouth 2 (two) times daily.      [name] acetaminophen (TYLENOL) 650 MG CR tablet Take 1,300 mg by mouth 2 (two) times daily.      [name] amLODipine (NORVASC) 10 MG tablet Take 1 tablet (10 mg total) by mouth daily.  90 tablet  3  . b complex vitamins tablet Take 1 tablet by mouth daily.      . Calcium Carbonate-Vitamin D (CALCIUM 600 + D PO) Take 1 tablet by mouth 2 (two) times daily.      . Cholecalciferol (VITAMIN D) 2000 UNITS CAPS Take 2,000 Units by mouth 2 (two) times daily.      [name] docusate sodium (COLACE) 100 MG capsule Take 200 mg by mouth at bedtime.      [name] esomeprazole (NEXIUM) 40 MG capsule Take 40 mg by mouth daily before breakfast.      . gabapentin (NEURONTIN) 400 MG capsule Take 400 mg by mouth 4 (four) times daily as needed. Nerve Pain      . HYDROmorphone (DILAUDID) 2 MG tablet Take 2 mg by mouth every 4 (four)  hours as needed. Pain      . hydroxychloroquine (PLAQUENIL) 200 MG tablet Take 200 mg by mouth 2 (two) times daily.      [name] levothyroxine (SYNTHROID, LEVOTHROID) 125 MCG tablet Take 125 mcg by mouth daily.       . methocarbamol (ROBAXIN) 500 MG tablet Take 500 mg by mouth 2 (two) times daily.       . Multiple Vitamin (MULTIVITAMIN WITH MINERALS) TABS Take 1 tablet by mouth daily.      . polyethylene glycol (MIRALAX / GLYCOLAX) packet Take 17 g by mouth daily as needed. Constipation      . predniSONE (DELTASONE) 5 MG tablet Take 1.5 tablets (7.5 mg total) by mouth daily.  135 tablet  3  . Probiotic Product (PROBIOTIC DAILY PO) Take by mouth daily.      . psyllium (REGULOID) 0.52 G capsule Take 0.52 g by mouth daily.      . temazepam (RESTORIL) 30 MG capsule Take 30 mg by mouth at bedtime as needed. Sleep      . traMADol (ULTRAM) 50 MG tablet Take 50 mg by mouth every 6 (six) hours as needed. Pain      . zoledronic acid (RECLAST) 5 MG/100ML SOLN Inject 5 mg into the vein  once.      . promethazine (PHENERGAN) 25 MG tablet Take 12.5 mg by mouth 2 (two) times daily as needed.        No current facility-administered medications for this visit.   Past Medical History  Diagnosis Date  . Complication of anesthesia     nausea ,  psycotics episodes  . Hypertension   . Hypothyroidism   . Sleep apnea   . GERD (gastroesophageal reflux disease)   . Neuromuscular disorder     neuropathy  legs  . Arthritis     rheumatoid  . Anemia   . Stroke     mild  . RA (rheumatoid arthritis)    Past Surgical History  Procedure Laterality Date  . Lumbar spine surgery  2005  . Abdominal hysterectomy  2005  . Colon surgery      colostomy then revision  . Hand arthroplasty    . Ankle surgery    . Colonoscopy  04/04/2012    Procedure: COLONOSCOPY;  Surgeon: Malissa Hippo, MD;  Location: [facility];  Service: Endoscopy;  Laterality: N/A;  410-Ok per [place]  . Esophagogastroduodenoscopy  04/04/2012     Procedure: ESOPHAGOGASTRODUODENOSCOPY (EGD);  Surgeon: Malissa Hippo, MD;  Location: [facility];  Service: Endoscopy;  Laterality: N/A;  possible  . Abdominal surgery      2006 colectomy for diverticulitis   Allergies  Allergen Reactions  . Betadine (Povidone Iodine) Itching    "Burns"  . Cinnamon Anaphylaxis    Throat and Tongue   . Codeine Nausea Only    Patient can become "psychotic"  . Other Nausea And Vomiting and Other (See Comments)    Pt states "all narcotic pain medications make me sick to my stomach and sometimes make me psychotic"  . Actonel (Risedronate Sodium)     stomach  . Adhesive (Tape) Other (See Comments)    Irritates skin   . Lidocaine         Objective:   Physical Exam  Filed Vitals:   01/01/13 1523  BP: 134/62  Pulse: 68  Height: 5\' 5"  (1.651 m)  Weight: 183 lb 14.4 oz (83.416 kg)  Alert and oriented. Skin warm and dry. Oral mucosa is moist.   . Sclera anicteric, conjunctivae is pink. Thyroid not enlarged. No cervical lymphadenopathy. Lungs clear. Heart regular rate and rhythm.  Abdomen is soft. Bowel sounds are positive. No hepatomegaly. No abdominal masses felt. No tenderness.  No edema to lower extremities.        Assessment:    GERD. Symptomatic with spicy foods. Presently taking Nexium for this.    Plan:    Will try her on Dexilant for a month and hopefully she can go back on the Nexium. She will have an OV in 1 month.  GERD diet given to patient,.

## 2013-01-01 NOTE — Patient Instructions (Addendum)
Dexilant samples. Review GERD diet. OV in 1 months

## 2013-01-13 ENCOUNTER — Other Ambulatory Visit (INDEPENDENT_AMBULATORY_CARE_PROVIDER_SITE_OTHER): Payer: Self-pay | Admitting: Internal Medicine

## 2013-01-13 DIAGNOSIS — K219 Gastro-esophageal reflux disease without esophagitis: Secondary | ICD-10-CM

## 2013-01-13 MED ORDER — DEXLANSOPRAZOLE 60 MG PO CPDR: 60.0000 mg | DELAYED_RELEASE_CAPSULE | Freq: Every day | ORAL | Status: DC

## 2013-01-15 ENCOUNTER — Telehealth (INDEPENDENT_AMBULATORY_CARE_PROVIDER_SITE_OTHER): Payer: Self-pay | Admitting: *Deleted

## 2013-01-15 NOTE — Telephone Encounter (Signed)
We rec'd a prior authorization for Dexilant, with a list of the preferred PPI's. Nexium was one of the preferred on the list. Per Delrae Rend the patient should go back to taking the Nexium 40 mg 1 by mouth 30 minutes before breakfast. I called the patient and left a message for her on her voicemail, I also faxed to her pharmacy the same information.

## 2013-01-29 ENCOUNTER — Encounter (INDEPENDENT_AMBULATORY_CARE_PROVIDER_SITE_OTHER): Payer: Self-pay | Admitting: Internal Medicine

## 2013-01-29 ENCOUNTER — Ambulatory Visit (INDEPENDENT_AMBULATORY_CARE_PROVIDER_SITE_OTHER): Payer: Medicare Other | Admitting: Internal Medicine

## 2013-01-29 VITALS — BP 122/54 | HR 72 | Temp 98.3°F | Ht 64.0 in | Wt 181.8 lb

## 2013-01-29 DIAGNOSIS — K219 Gastro-esophageal reflux disease without esophagitis: Secondary | ICD-10-CM

## 2013-01-29 NOTE — Progress Notes (Signed)
Subjective:     Patient ID: Sabrina Adams, female   DOB: 01/28/1946, 67 y.o.   MRN: [id number]  HPI  She tells me her acid reflux is better. She was given a trial of Dexilant which helped. Her insurance would not pay for the Dexilant. I advised her last visit to finish the Dexilant and then go back on the Nexium. She does not eat close to bed time.  Appetite is good. No weight loss.   She was last seen in May of this year for GERD. She has been taking Nexium for her acid reflux x 1 yr.  Appetite is good. No weight loss. She usually has a BM once a day. Takes Miralax and a stool softner.  No melena or bright red rectal bleeding.      Review of Systems see hpi Current Outpatient Prescriptions  Medication Sig Dispense Refill  . acetaminophen (TYLENOL) 500 MG tablet Take 1,000 mg by mouth 2 (two) times daily.      [name] acetaminophen (TYLENOL) 650 MG CR tablet Take 1,300 mg by mouth 2 (two) times daily.      [name] amLODipine (NORVASC) 10 MG tablet Take 1 tablet (10 mg total) by mouth daily.  90 tablet  3  . b complex vitamins tablet Take 1 tablet by mouth daily.      . Calcium Carbonate-Vitamin D (CALCIUM 600 + D PO) Take 1 tablet by mouth 2 (two) times daily.      . Cholecalciferol (VITAMIN D) 2000 UNITS CAPS Take 2,000 Units by mouth 2 (two) times daily.      [name] dexlansoprazole (DEXILANT) 60 MG capsule Take 1 capsule (60 mg total) by mouth daily.  30 capsule  0  . docusate sodium (COLACE) 100 MG capsule Take 200 mg by mouth at bedtime.      [name] esomeprazole (NEXIUM) 40 MG capsule Take 40 mg by mouth daily before breakfast.      . gabapentin (NEURONTIN) 400 MG capsule Take 400 mg by mouth 4 (four) times daily as needed. Nerve Pain      . HYDROmorphone (DILAUDID) 2 MG tablet Take 2 mg by mouth every 4 (four) hours as needed. Pain      . hydroxychloroquine (PLAQUENIL) 200 MG tablet Take 200 mg by mouth 2 (two) times daily.      [name] levothyroxine (SYNTHROID, LEVOTHROID) 125 MCG tablet Take 125 mcg by  mouth daily.       . methocarbamol (ROBAXIN) 500 MG tablet Take 500 mg by mouth 2 (two) times daily.       . Multiple Vitamin (MULTIVITAMIN WITH MINERALS) TABS Take 1 tablet by mouth daily.      . polyethylene glycol (MIRALAX / GLYCOLAX) packet Take 17 g by mouth daily as needed. Constipation      . predniSONE (DELTASONE) 5 MG tablet Take 1.5 tablets (7.5 mg total) by mouth daily.  135 tablet  3  . Probiotic Product (PROBIOTIC DAILY PO) Take by mouth daily.      . promethazine (PHENERGAN) 25 MG tablet Take 12.5 mg by mouth 2 (two) times daily as needed.       . psyllium (REGULOID) 0.52 G capsule Take 0.52 g by mouth daily.      . temazepam (RESTORIL) 30 MG capsule Take 30 mg by mouth at bedtime as needed. Sleep      . traMADol (ULTRAM) 50 MG tablet Take 50 mg by mouth every 6 (six) hours as needed. Pain      .  zoledronic acid (RECLAST) 5 MG/100ML SOLN Inject 5 mg into the vein once.       No current facility-administered medications for this visit.   Past Medical History  Diagnosis Date  . Complication of anesthesia     nausea ,  psycotics episodes  . Hypertension   . Hypothyroidism   . Sleep apnea   . GERD (gastroesophageal reflux disease)   . Neuromuscular disorder     neuropathy  legs  . Arthritis     rheumatoid  . Anemia   . Stroke     mild  . RA (rheumatoid arthritis)    Past Surgical History  Procedure Laterality Date  . Lumbar spine surgery  2005  . Abdominal hysterectomy  2005  . Colon surgery      colostomy then revision  . Hand arthroplasty    . Ankle surgery    . Colonoscopy  04/04/2012    Procedure: COLONOSCOPY;  Surgeon: Malissa Hippo, MD;  Location: [facility];  Service: Endoscopy;  Laterality: N/A;  410-Ok per [place]  . Esophagogastroduodenoscopy  04/04/2012    Procedure: ESOPHAGOGASTRODUODENOSCOPY (EGD);  Surgeon: Malissa Hippo, MD;  Location: [facility];  Service: Endoscopy;  Laterality: N/A;  possible  . Abdominal surgery      2006 colectomy for  diverticulitis   Allergies  Allergen Reactions  . Betadine (Povidone Iodine) Itching    "Burns"  . Cinnamon Anaphylaxis    Throat and Tongue   . Codeine Nausea Only    Patient can become "psychotic"  . Other Nausea And Vomiting and Other (See Comments)    Pt states "all narcotic pain medications make me sick to my stomach and sometimes make me psychotic"  . Actonel (Risedronate Sodium)     stomach  . Adhesive (Tape) Other (See Comments)    Irritates skin   . Lidocaine         Objective:   Physical Exam Alert and oriented. Skin warm and dry. Oral mucosa is moist.   . Sclera anicteric, conjunctivae is pink. Thyroid not enlarged. No cervical lymphadenopathy. Lungs clear. Heart regular rate and rhythm.  Abdomen is soft. Bowel sounds are positive. No hepatomegaly. No abdominal masses felt. No tenderness.  No edema to lower extremities.       Assessment:     GERD which is much now. Symptoms controlled at this time.     Plan:    OV one 2yr. Dexilant samples for breakthru acid reflux. If any problems please call our office.

## 2013-01-29 NOTE — Patient Instructions (Addendum)
OV in 1 yr. Dexilant samples given to patient x 2

## 2013-03-11 ENCOUNTER — Other Ambulatory Visit (HOSPITAL_COMMUNITY): Payer: Self-pay | Admitting: *Deleted

## 2013-03-12 ENCOUNTER — Encounter (HOSPITAL_COMMUNITY)
Admission: RE | Admit: 2013-03-12 | Discharge: 2013-03-12 | Disposition: A | Discharge status: Home / Self Care | Payer: Medicare Other | Source: Ambulatory Visit | Attending: Rheumatology | Admitting: Rheumatology

## 2013-03-12 DIAGNOSIS — M069 Rheumatoid arthritis, unspecified: Secondary | ICD-10-CM | POA: Insufficient documentation

## 2013-03-12 MED ORDER — ZOLEDRONIC ACID 5 MG/100ML IV SOLN
INTRAVENOUS | Status: AC
  Filled 2013-03-12: qty 100

## 2013-03-12 MED ORDER — ZOLEDRONIC ACID 5 MG/100ML IV SOLN
5.0000 mg | Freq: Once | INTRAVENOUS | Status: AC
  Administered 2013-03-12: 5 mg via INTRAVENOUS

## 2013-04-22 ENCOUNTER — Ambulatory Visit (INDEPENDENT_AMBULATORY_CARE_PROVIDER_SITE_OTHER): Payer: Medicare Other | Admitting: Family Medicine

## 2013-04-22 ENCOUNTER — Encounter: Payer: Self-pay | Admitting: Family Medicine

## 2013-04-22 VITALS — BP 132/90 | Ht 65.0 in | Wt 179.6 lb

## 2013-04-22 DIAGNOSIS — E039 Hypothyroidism, unspecified: Secondary | ICD-10-CM | POA: Insufficient documentation

## 2013-04-22 DIAGNOSIS — E785 Hyperlipidemia, unspecified: Secondary | ICD-10-CM

## 2013-04-22 DIAGNOSIS — Z23 Encounter for immunization: Secondary | ICD-10-CM

## 2013-04-22 DIAGNOSIS — I10 Essential (primary) hypertension: Secondary | ICD-10-CM

## 2013-04-22 DIAGNOSIS — G894 Chronic pain syndrome: Secondary | ICD-10-CM

## 2013-04-22 MED ORDER — METHOCARBAMOL 500 MG PO TABS: 500.0000 mg | ORAL_TABLET | Freq: Two times a day (BID) | ORAL | Status: DC

## 2013-04-22 MED ORDER — TEMAZEPAM 30 MG PO CAPS: 30.0000 mg | ORAL_CAPSULE | Freq: Every evening | ORAL | Status: DC | PRN

## 2013-04-22 MED ORDER — GABAPENTIN 400 MG PO CAPS: 400.0000 mg | ORAL_CAPSULE | Freq: Four times a day (QID) | ORAL | Status: DC | PRN

## 2013-04-22 MED ORDER — TRAMADOL HCL 50 MG PO TABS: 50.0000 mg | ORAL_TABLET | Freq: Four times a day (QID) | ORAL | Status: DC | PRN

## 2013-04-22 NOTE — Progress Notes (Signed)
  Subjective:    Patient ID: Sabrina Adams, female    DOB: 07/09/1946, 67 y.o.   MRN: [id number]  HPI  Here today for check up from 5/16 related to hypertension.   Needs refill on meds.  Patient has significant problems with her arthritis and also chronic pain medications seem to be doing well with her. She does try to take her medicines as directed she denies abusing medication. She denies chest tightness pressure pain shortness of breath vomiting diarrhea she does not smoke family history noncontributory was reviewed. Would like the flu vaccine.    Review of Systems     Objective:   Physical Exam  Severe arthritis noted. Lungs are clear hearts regular extremities no edema abdomen is soft neck no masses eardrums normal      Assessment & Plan:  #1 hypertension-decent control overall #2 arthritis followed by specialist #3 chronic pain refills given today #4 hypothyroidism continue current medication #5 insomnia temazepam refills given #6 followup in 3 months 25 minutes spent with patient following through everything plus also refilling all the medications

## 2013-04-24 ENCOUNTER — Encounter: Payer: Self-pay | Admitting: Family Medicine

## 2013-04-24 LAB — LIPID PANEL
Cholesterol: 216 mg/dL — ABNORMAL HIGH (ref 0–200)
LDL Cholesterol: 108 mg/dL — ABNORMAL HIGH (ref 0–99)
Total CHOL/HDL Ratio: 3 Ratio
VLDL: 37 mg/dL (ref 0–40)

## 2013-04-24 LAB — BASIC METABOLIC PANEL
BUN: 17 mg/dL (ref 6–23)
CO2: 31 mEq/L (ref 19–32)
Glucose, Bld: 80 mg/dL (ref 70–99)
Potassium: 4.6 mEq/L (ref 3.5–5.3)
Sodium: 142 mEq/L (ref 135–145)

## 2013-04-24 LAB — TSH: TSH: 2.114 u[IU]/mL (ref 0.350–4.500)

## 2013-05-02 ENCOUNTER — Other Ambulatory Visit: Payer: Self-pay | Admitting: Family Medicine

## 2013-05-02 DIAGNOSIS — Z139 Encounter for screening, unspecified: Secondary | ICD-10-CM

## 2013-05-03 ENCOUNTER — Encounter: Payer: Self-pay | Admitting: Family Medicine

## 2013-05-05 ENCOUNTER — Ambulatory Visit (HOSPITAL_COMMUNITY)
Admission: RE | Admit: 2013-05-05 | Discharge: 2013-05-05 | Disposition: A | Discharge status: Home / Self Care | Payer: Medicare Other | Source: Ambulatory Visit | Attending: Family Medicine | Admitting: Family Medicine

## 2013-05-05 DIAGNOSIS — Z1231 Encounter for screening mammogram for malignant neoplasm of breast: Secondary | ICD-10-CM | POA: Insufficient documentation

## 2013-05-05 DIAGNOSIS — Z139 Encounter for screening, unspecified: Secondary | ICD-10-CM

## 2013-05-06 ENCOUNTER — Encounter: Payer: Self-pay | Admitting: *Deleted

## 2013-07-28 ENCOUNTER — Encounter: Payer: Self-pay | Admitting: Family Medicine

## 2013-07-28 ENCOUNTER — Ambulatory Visit (INDEPENDENT_AMBULATORY_CARE_PROVIDER_SITE_OTHER): Payer: Medicare Other | Admitting: Family Medicine

## 2013-07-28 VITALS — BP 132/90 | Ht 65.0 in | Wt 182.0 lb

## 2013-07-28 DIAGNOSIS — G894 Chronic pain syndrome: Secondary | ICD-10-CM

## 2013-07-28 DIAGNOSIS — I10 Essential (primary) hypertension: Secondary | ICD-10-CM

## 2013-07-28 DIAGNOSIS — E039 Hypothyroidism, unspecified: Secondary | ICD-10-CM

## 2013-07-28 MED ORDER — LEVOTHYROXINE SODIUM 125 MCG PO TABS: 125.0000 ug | ORAL_TABLET | Freq: Every day | ORAL | Status: DC

## 2013-07-28 MED ORDER — HYDROMORPHONE HCL 2 MG PO TABS: 2.0000 mg | ORAL_TABLET | ORAL | Status: DC | PRN

## 2013-07-28 NOTE — Progress Notes (Signed)
   Subjective:    Patient ID: Sabrina Adams, female    DOB: 12-12-1945, 67 y.o.   MRN: [id number]  Hypertension This is a chronic problem. The current episode started more than 1 year ago. The problem has been gradually improving since onset. The problem is controlled. There are no associated agents to hypertension. There are no known risk factors for coronary artery disease. Treatments tried: amlodipine. The current treatment provides significant improvement. There are no compliance problems.   Patient states she has no concerns at this time. She is doing very well. Needs refill on levothyroxine.  PMH hypothyroidism and rheumatoid arthritis   Review of Systems She denies any chest tightness pressure pain shortness breath nausea vomiting diarrhea. She does relate severe pain from the arthritis.    Objective:   Physical Exam  Lungs are clear hearts regular pulse normal blood pressure good extremities no edema blood pressure was rechecked twice slightly elevated compared when she came in      Assessment & Plan:  #1 HTN she will check her blood pressure as an outpatient monitor to make sure it's under good control she will follow up again in 3 months time extensive lab work at that time.  #2 chronic pain prescription for pain medicine was renewed. Given to her. She uses it sparingly. She uses tramadol on a regular basis uses Dilaudid  sparingly.

## 2013-07-28 NOTE — Addendum Note (Signed)
Addended by: Lilyan Punt A on: 07/28/2013 07:37 PM   Modules accepted: Level of Service

## 2013-09-29 ENCOUNTER — Telehealth: Payer: Self-pay | Admitting: Family Medicine

## 2013-09-29 DIAGNOSIS — Z79899 Other long term (current) drug therapy: Secondary | ICD-10-CM

## 2013-09-29 DIAGNOSIS — I10 Essential (primary) hypertension: Secondary | ICD-10-CM

## 2013-09-29 DIAGNOSIS — E039 Hypothyroidism, unspecified: Secondary | ICD-10-CM

## 2013-09-29 DIAGNOSIS — R5381 Other malaise: Secondary | ICD-10-CM

## 2013-09-29 DIAGNOSIS — R5383 Other fatigue: Secondary | ICD-10-CM

## 2013-09-29 NOTE — Telephone Encounter (Signed)
Patient needs order for blood work. °

## 2013-09-29 NOTE — Telephone Encounter (Signed)
Patient was notified that blood work has been ordered and can report to the lab.

## 2013-09-29 NOTE — Telephone Encounter (Signed)
Lipid and TSH, (her rheumatologist should be doing yearly CBC and Liver, if not then please order-rea:high risk med, then fu ov

## 2013-09-29 NOTE — Telephone Encounter (Signed)
Do same labs that was done in September?

## 2013-09-30 ENCOUNTER — Encounter (INDEPENDENT_AMBULATORY_CARE_PROVIDER_SITE_OTHER): Payer: Self-pay | Admitting: *Deleted

## 2013-09-30 LAB — LIPID PANEL
Cholesterol: 224 mg/dL — ABNORMAL HIGH (ref 0–200)
HDL: 68 mg/dL (ref >39)
LDL Cholesterol: 115 mg/dL — ABNORMAL HIGH (ref 0–99)
Total CHOL/HDL Ratio: 3.3 Ratio
Triglycerides: 206 mg/dL — ABNORMAL HIGH (ref <150)
VLDL: 41 mg/dL — ABNORMAL HIGH (ref 0–40)

## 2013-09-30 LAB — CBC WITH DIFFERENTIAL/PLATELET
Basophils Absolute: 0 10*3/uL (ref 0.0–0.1)
Basophils Relative: 1 % (ref 0–1)
Eosinophils Absolute: 0 10*3/uL (ref 0.0–0.7)
Eosinophils Relative: 1 % (ref 0–5)
HEMATOCRIT: 43.9 % (ref 36.0–46.0)
Hemoglobin: 14.5 g/dL (ref 12.0–15.0)
LYMPHS ABS: 1.8 10*3/uL (ref 0.7–4.0)
LYMPHS PCT: 50 % — AB (ref 12–46)
MCH: 30 pg (ref 26.0–34.0)
MCHC: 33 g/dL (ref 30.0–36.0)
MCV: 90.7 fL (ref 78.0–100.0)
MONO ABS: 0.4 10*3/uL (ref 0.1–1.0)
MONOS PCT: 12 % (ref 3–12)
NEUTROS ABS: 1.3 10*3/uL — AB (ref 1.7–7.7)
Neutrophils Relative %: 36 % — ABNORMAL LOW (ref 43–77)
Platelets: 271 10*3/uL (ref 150–400)
RBC: 4.84 MIL/uL (ref 3.87–5.11)
RDW: 13.5 % (ref 11.5–15.5)
WBC: 3.6 10*3/uL — AB (ref 4.0–10.5)

## 2013-09-30 LAB — HEPATIC FUNCTION PANEL
ALT: 22 U/L (ref 0–35)
AST: 29 U/L (ref 0–37)
Albumin: 4.3 g/dL (ref 3.5–5.2)
Alkaline Phosphatase: 62 U/L (ref 39–117)
BILIRUBIN DIRECT: 0.1 mg/dL (ref 0.0–0.3)
BILIRUBIN INDIRECT: 0.4 mg/dL (ref 0.2–1.2)
Total Bilirubin: 0.5 mg/dL (ref 0.2–1.2)
Total Protein: 6.6 g/dL (ref 6.0–8.3)

## 2013-10-01 LAB — TSH: TSH: 2.3 u[IU]/mL (ref 0.350–4.500)

## 2013-10-03 ENCOUNTER — Encounter: Payer: Self-pay | Admitting: Family Medicine

## 2013-10-27 ENCOUNTER — Ambulatory Visit: Payer: Medicare Other | Admitting: Family Medicine

## 2013-10-28 ENCOUNTER — Ambulatory Visit (INDEPENDENT_AMBULATORY_CARE_PROVIDER_SITE_OTHER): Payer: Medicare Other | Admitting: Family Medicine

## 2013-10-28 ENCOUNTER — Encounter: Payer: Self-pay | Admitting: Family Medicine

## 2013-10-28 VITALS — BP 144/90 | Ht 65.0 in | Wt 175.1 lb

## 2013-10-28 DIAGNOSIS — G894 Chronic pain syndrome: Secondary | ICD-10-CM

## 2013-10-28 DIAGNOSIS — E039 Hypothyroidism, unspecified: Secondary | ICD-10-CM

## 2013-10-28 DIAGNOSIS — E785 Hyperlipidemia, unspecified: Secondary | ICD-10-CM

## 2013-10-28 DIAGNOSIS — I10 Essential (primary) hypertension: Secondary | ICD-10-CM

## 2013-10-28 MED ORDER — TRAMADOL HCL 50 MG PO TABS: 50.0000 mg | ORAL_TABLET | Freq: Four times a day (QID) | ORAL | Status: DC | PRN

## 2013-10-28 NOTE — Progress Notes (Signed)
   Subjective:    Patient ID: Sabrina Adams, female    DOB: July 24, 1946, 68 y.o.   MRN: [id number]  Hypertension This is a chronic problem. The current episode started more than 1 year ago. The problem has been gradually improving since onset. The problem is controlled. Pertinent negatives include no chest pain or shortness of breath. There are no associated agents to hypertension. There are no known risk factors for coronary artery disease. Treatments tried: amlodipine. The current treatment provides significant improvement. There are no compliance problems.   Patient needs refills on tramadol and plaquenil. Patient states she has no concerns at this time during this visit.   Long discussion held regarding her medications eating. Overall she seems to be doing good taking her medicines correctly. Several refills needed these were given.  Review of Systems  Constitutional: Negative for activity change, appetite change and fatigue.  HENT: Negative for congestion.   Respiratory: Negative for cough and shortness of breath.   Cardiovascular: Negative for chest pain.  Gastrointestinal: Negative for abdominal pain.  Endocrine: Negative for polydipsia and polyphagia.  Genitourinary: Negative for frequency.  Musculoskeletal: Positive for arthralgias, back pain, gait problem, joint swelling and myalgias.  Neurological: Negative for weakness.  Psychiatric/Behavioral: Negative for confusion.       Objective:   Physical Exam  Vitals reviewed. Constitutional: She appears well-nourished. No distress.  Cardiovascular: Normal rate, regular rhythm and normal heart sounds.   No murmur heard. Pulmonary/Chest: Effort normal and breath sounds normal. No respiratory distress.  Musculoskeletal: She exhibits no edema.  Lymphadenopathy:    She has no cervical adenopathy.  Neurological: She is alert. She exhibits normal muscle tone.  Psychiatric: Her behavior is normal.          Assessment & Plan:    HTN-under good control. Continue current measures. Watch diet closely. Patient unable to be very active due to her rheumatoid arthritis.   Hypothy-continue current medications TSH looked good. Chronic Pain-she uses tramadol on a regular basis for which I think is perfectly fine. Sure he has enough Dilaudid- she does not use that often. Hyperlip-continue to watch diet closely Will need ortho when ready for referral, patient will eventually need orthopedic referral family will be thinking about it and let us know. ( Does not want Dr Laruth Bouchard)

## 2014-01-13 ENCOUNTER — Other Ambulatory Visit: Payer: Self-pay | Admitting: *Deleted

## 2014-01-13 MED ORDER — AMLODIPINE BESYLATE 10 MG PO TABS: 10.0000 mg | ORAL_TABLET | Freq: Every day | ORAL | Status: DC

## 2014-01-30 ENCOUNTER — Ambulatory Visit: Payer: Medicare Other | Admitting: Family Medicine

## 2014-02-09 ENCOUNTER — Ambulatory Visit (INDEPENDENT_AMBULATORY_CARE_PROVIDER_SITE_OTHER): Payer: Medicare Other | Admitting: Internal Medicine

## 2014-02-13 ENCOUNTER — Other Ambulatory Visit: Payer: Self-pay | Admitting: *Deleted

## 2014-02-13 MED ORDER — PREDNISONE 5 MG PO TABS
7.5000 mg | ORAL_TABLET | Freq: Every day | ORAL | Status: DC
Start: 2014-02-13 — End: 2014-05-12

## 2014-02-16 ENCOUNTER — Encounter: Payer: Self-pay | Admitting: Family Medicine

## 2014-02-16 ENCOUNTER — Ambulatory Visit (INDEPENDENT_AMBULATORY_CARE_PROVIDER_SITE_OTHER): Payer: Medicare Other | Admitting: Family Medicine

## 2014-02-16 VITALS — BP 132/76 | Ht 65.0 in | Wt 175.0 lb

## 2014-02-16 DIAGNOSIS — M8580 Other specified disorders of bone density and structure, unspecified site: Secondary | ICD-10-CM

## 2014-02-16 DIAGNOSIS — M899 Disorder of bone, unspecified: Secondary | ICD-10-CM

## 2014-02-16 DIAGNOSIS — I10 Essential (primary) hypertension: Secondary | ICD-10-CM

## 2014-02-16 DIAGNOSIS — M949 Disorder of cartilage, unspecified: Secondary | ICD-10-CM

## 2014-02-16 DIAGNOSIS — Z23 Encounter for immunization: Secondary | ICD-10-CM

## 2014-02-16 DIAGNOSIS — E038 Other specified hypothyroidism: Secondary | ICD-10-CM

## 2014-02-16 DIAGNOSIS — K219 Gastro-esophageal reflux disease without esophagitis: Secondary | ICD-10-CM

## 2014-02-16 DIAGNOSIS — G894 Chronic pain syndrome: Secondary | ICD-10-CM

## 2014-02-16 MED ORDER — TRAMADOL HCL 50 MG PO TABS: 50.0000 mg | ORAL_TABLET | Freq: Four times a day (QID) | ORAL | Status: DC | PRN

## 2014-02-16 MED ORDER — PROMETHAZINE HCL 25 MG PO TABS: 12.5000 mg | ORAL_TABLET | Freq: Two times a day (BID) | ORAL | Status: DC | PRN

## 2014-02-16 MED ORDER — METHOCARBAMOL 500 MG PO TABS: 500.0000 mg | ORAL_TABLET | Freq: Two times a day (BID) | ORAL | Status: DC

## 2014-02-16 NOTE — Progress Notes (Signed)
   Subjective:    Patient ID: Sabrina Adams, female    DOB: Jan 19, 1946, 68 y.o.   MRN: [id number]  HPI Patient is here today for a 3 month check up.  Pt needs a refill on the methocarbamol, tramadol, and phenergan.  Pt has no concerns.  Patient denies any chest tightness pressure pain shortness breath nausea vomiting or diarrhea. She wants refills on her medicine which will granted today. Blood pressure was rechecked it looked good.  Review of Systems  Constitutional: Negative for activity change, appetite change and fatigue.  HENT: Negative for congestion.   Respiratory: Negative for cough and choking.   Cardiovascular: Positive for leg swelling. Negative for chest pain.  Gastrointestinal: Negative for abdominal pain.  Endocrine: Negative for polydipsia and polyphagia.  Genitourinary: Negative for frequency.  Neurological: Negative for weakness.  Psychiatric/Behavioral: Negative for confusion.       Objective:   Physical Exam  Vitals reviewed. Constitutional: She appears well-nourished. No distress.  Cardiovascular: Normal rate, regular rhythm and normal heart sounds.   No murmur heard. Pulmonary/Chest: Effort normal and breath sounds normal. No respiratory distress.  Musculoskeletal: She exhibits no edema.  Lymphadenopathy:    She has no cervical adenopathy.  Neurological: She is alert. She exhibits normal muscle tone.  Psychiatric: Her behavior is normal.          Assessment & Plan:  Arthritis stable she is on medication she states compliance with that  #2 chronic pain related to #1 refill on tramadol given. Patient uses anywhere between 4 and 6 times per day which is reasonable given her condition she does not tolerate other pain medicines  #3 she is due for pneumococcal vaccine today No need for any comprehensive labs currently

## 2014-03-03 ENCOUNTER — Encounter (INDEPENDENT_AMBULATORY_CARE_PROVIDER_SITE_OTHER): Payer: Self-pay | Admitting: Internal Medicine

## 2014-03-03 ENCOUNTER — Ambulatory Visit (INDEPENDENT_AMBULATORY_CARE_PROVIDER_SITE_OTHER): Payer: Medicare Other | Admitting: Internal Medicine

## 2014-03-03 VITALS — BP 120/78 | HR 76 | Temp 98.5°F | Resp 18 | Ht 64.0 in | Wt 172.6 lb

## 2014-03-03 DIAGNOSIS — K5901 Slow transit constipation: Secondary | ICD-10-CM

## 2014-03-03 DIAGNOSIS — K219 Gastro-esophageal reflux disease without esophagitis: Secondary | ICD-10-CM

## 2014-03-03 NOTE — Progress Notes (Signed)
Presenting complaint;  Follow for chronic GERD and constipation..  Subjective:  Patient is 68 year old Caucasian female with multiple medical problems including rheumatoid arthritis who is here for yearly visit accompanied by her husband. She says she is doing well. She did experience heartburn recently after eating pizza 2 days in a row. She denies dysphagia sore throat hoarseness or chronic cough. She also denies abdominal pain melena or rectal bleeding. Her bowels move daily with help. She has lost 9 pounds since her last visit by trying. She takes Dilaudid and MiraLax occasionally.   Current Medications: Outpatient Encounter Prescriptions as of 03/03/2014  Medication Sig  . acetaminophen (TYLENOL) 500 MG tablet Take 500 mg by mouth 2 (two) times daily.   [name] acetaminophen (TYLENOL) 650 MG CR tablet Take 650 mg by mouth 2 (two) times daily.   [name] amLODipine (NORVASC) 10 MG tablet Take 1 tablet (10 mg total) by mouth daily.  [name] b complex vitamins tablet Take 1 tablet by mouth daily.  . Calcium Carbonate-Vitamin D (CALCIUM 600 + D PO) Take 1 tablet by mouth 2 (two) times daily.  . Cholecalciferol (VITAMIN D) 2000 UNITS CAPS Take 2,000 Units by mouth 2 (two) times daily.  [name] docusate sodium (COLACE) 100 MG capsule Take 200 mg by mouth at bedtime.  [name] esomeprazole (NEXIUM) 40 MG capsule Take 40 mg by mouth daily before breakfast.  . gabapentin (NEURONTIN) 300 MG capsule Take 300 mg by mouth 4 (four) times daily.  [name] HYDROmorphone (DILAUDID) 2 MG tablet Take 1 tablet (2 mg total) by mouth every 4 (four) hours as needed. Pain  . hydroxychloroquine (PLAQUENIL) 200 MG tablet Take 200 mg by mouth 2 (two) times daily.  [name] levothyroxine (SYNTHROID, LEVOTHROID) 125 MCG tablet Take 1 tablet (125 mcg total) by mouth daily.  . methocarbamol (ROBAXIN) 500 MG tablet Take 1 tablet (500 mg total) by mouth 2 (two) times daily.  . Multiple Vitamin (MULTIVITAMIN WITH MINERALS) TABS Take 1 tablet by mouth daily.  .  polyethylene glycol (MIRALAX / GLYCOLAX) packet Take 17 g by mouth daily as needed. Constipation  . predniSONE (DELTASONE) 5 MG tablet Take 1.5 tablets (7.5 mg total) by mouth daily.  . Probiotic Product (PROBIOTIC DAILY PO) Take by mouth daily.  . promethazine (PHENERGAN) 25 MG tablet Take 0.5 tablets (12.5 mg total) by mouth 2 (two) times daily as needed.  . psyllium (REGULOID) 0.52 G capsule Take 0.52 g by mouth daily.  . temazepam (RESTORIL) 30 MG capsule Take 1 capsule (30 mg total) by mouth at bedtime as needed. Sleep  . traMADol (ULTRAM) 50 MG tablet Take 1 tablet (50 mg total) by mouth every 6 (six) hours as needed. Pain     Objective: Blood pressure 120/78, pulse 76, temperature 98.5 F (36.9 C), temperature source Oral, resp. rate 18, height 5\' 4"  (1.626 m), weight 172 lb 9.6 oz (78.291 kg). Patient is alert and in no acute distress. Conjunctiva is pink. Sclera is nonicteric Oropharyngeal mucosa is normal. No neck masses or thyromegaly noted. Cardiac exam with regular rhythm normal S1 and S2. No murmur or gallop noted. Lungs are clear to auscultation. Abdomen is full but soft and nontender without organomegaly or masses.  No LE edema or clubbing noted.    Assessment:  #1. GERD. She is doing well with therapy. She needs to continue PPI indefinitely as she is also at increased risk for peptic ulcer disease. Dose could be reduced at some point in future then her insurance would not cover  for medication. #2. Chronic constipation. She is doing well with therapy. Last colonoscopy was in August 2013.   Plan:  Continue present therapy and return for office visit in one year at earlier if Nexium stops working.

## 2014-03-03 NOTE — Patient Instructions (Signed)
Can use glycerin or Dulcolax suppository on an as-needed basis.

## 2014-03-04 ENCOUNTER — Telehealth: Payer: Self-pay | Admitting: Family Medicine

## 2014-03-04 NOTE — Telephone Encounter (Signed)
Rx prior auth APPROVED for pt's PROMETHAZINE 25mg , expires 03/03/15 through her [employer]

## 2014-04-02 ENCOUNTER — Other Ambulatory Visit: Payer: Self-pay | Admitting: Family Medicine

## 2014-05-11 ENCOUNTER — Telehealth: Payer: Self-pay | Admitting: *Deleted

## 2014-05-11 NOTE — Telephone Encounter (Signed)
Ok with 2 refills

## 2014-05-11 NOTE — Telephone Encounter (Signed)
Request for [facility]. Prednisone 5mg  take one and a half tablets daily. Requesting 90 day supply. Last seen 02/16/14

## 2014-05-12 MED ORDER — PREDNISONE 5 MG PO TABS: 7.5000 mg | ORAL_TABLET | Freq: Every day | ORAL | Status: DC

## 2014-05-12 NOTE — Telephone Encounter (Signed)
Rx sent electronically to pharmacy. Patient notified. 

## 2014-05-14 ENCOUNTER — Other Ambulatory Visit: Payer: Self-pay | Admitting: Family Medicine

## 2014-05-14 DIAGNOSIS — Z1231 Encounter for screening mammogram for malignant neoplasm of breast: Secondary | ICD-10-CM

## 2014-05-15 ENCOUNTER — Telehealth: Payer: Self-pay | Admitting: *Deleted

## 2014-05-15 NOTE — Telephone Encounter (Signed)
rx request from [facility] for prednisone 5mg  take one and a half daily.  Requesting 90 day supply. Last seen 02/16/14.

## 2014-05-15 NOTE — Telephone Encounter (Signed)
May have this +2 refills 

## 2014-05-15 NOTE — Telephone Encounter (Signed)
Rx was sent electronically to pharmacy 05/14/14

## 2014-05-22 ENCOUNTER — Ambulatory Visit (HOSPITAL_COMMUNITY)
Admission: RE | Admit: 2014-05-22 | Discharge: 2014-05-22 | Disposition: A | Discharge status: Home / Self Care | Payer: Medicare Other | Source: Ambulatory Visit | Attending: Family Medicine | Admitting: Family Medicine

## 2014-05-22 DIAGNOSIS — Z1231 Encounter for screening mammogram for malignant neoplasm of breast: Secondary | ICD-10-CM | POA: Insufficient documentation

## 2014-05-28 ENCOUNTER — Ambulatory Visit (INDEPENDENT_AMBULATORY_CARE_PROVIDER_SITE_OTHER): Payer: Medicare Other | Admitting: Family Medicine

## 2014-05-28 ENCOUNTER — Encounter: Payer: Self-pay | Admitting: Family Medicine

## 2014-05-28 VITALS — BP 122/70 | Ht 65.0 in | Wt 173.0 lb

## 2014-05-28 DIAGNOSIS — I10 Essential (primary) hypertension: Secondary | ICD-10-CM

## 2014-05-28 DIAGNOSIS — Z23 Encounter for immunization: Secondary | ICD-10-CM

## 2014-05-28 DIAGNOSIS — E785 Hyperlipidemia, unspecified: Secondary | ICD-10-CM

## 2014-05-28 DIAGNOSIS — T733XXD Exhaustion due to excessive exertion, subsequent encounter: Secondary | ICD-10-CM

## 2014-05-28 DIAGNOSIS — E038 Other specified hypothyroidism: Secondary | ICD-10-CM

## 2014-05-28 DIAGNOSIS — M8580 Other specified disorders of bone density and structure, unspecified site: Secondary | ICD-10-CM

## 2014-05-28 DIAGNOSIS — G894 Chronic pain syndrome: Secondary | ICD-10-CM

## 2014-05-28 MED ORDER — LEVOTHYROXINE SODIUM 125 MCG PO TABS: 125.0000 ug | ORAL_TABLET | Freq: Every day | ORAL | Status: DC

## 2014-05-28 MED ORDER — HYDROMORPHONE HCL 2 MG PO TABS: 2.0000 mg | ORAL_TABLET | ORAL | Status: DC | PRN

## 2014-05-28 NOTE — Progress Notes (Signed)
   Subjective:    Patient ID: Sabrina Adams, female    DOB: 01/03/1946, 68 y.o.   MRN: [id number]  HPI Patient is here today for a check up. This patient was seen today for chronic pain  The medication list was reviewed and updated.   -Compliance with pain medication: She uses Dilaudid occasionally depending on her pain  The patient was advised the importance of maintaining medication and not using illegal substances with these.  Refills needed: She does need a refill. We will see her back in 3-4 months if she needs an additional refill before then she can call  The patient was educated that we can provide 3 monthly scripts for their medication, it is their responsibility to follow the instructions.  Side effects or complications from medications: She denies side effects except for mild constipation  Patient is aware that pain medications are meant to minimize the severity of the pain to allow their pain levels to improve to allow for better function. They are aware of that pain medications cannot totally remove their pain.  Due for UDT ( at least once per year) : Deferred      She is still having problems with bowel moverments.  Ankles are getting weaker.     Review of Systems She denies chest she does relate a lot of joint pain and discomfort she relates stiffness she relates weakness in her ankles    Objective:   Physical Exam  Lungs are clear heart regular rate and control abdomen soft extremities no edema has a bunion on left foot and wear her toes are one is overlapping the other no sores are noted neck no masses blood pressure good      Assessment & Plan:  #1 chronic pain-she may use her pain medication as needed. She does not abuse it. I would recommend we see her back in 3-4 months I told her March 2016 we will see her sooner if any problems, patient also uses tramadol  #2 severe arthritis she really should be on a biologic agent. She has been hesitant to do  so because of severe infection from [facility] she will discuss this further with rheumatologists  #3 HTN good control currently  #4 mouth insomnia he uses Restoril on a when necessary basis  #5 thyroid issues continue current medications lab work will be indicated in the spring 25 minutes spent with patient covering multitudes of issues  Has a history of vitamin D deficiency we will check that On multiple chronic medications needs annual metabolic 7

## 2014-06-01 ENCOUNTER — Telehealth: Payer: Self-pay | Admitting: *Deleted

## 2014-06-01 NOTE — Telephone Encounter (Signed)
rx request from [facility] for prednisone 5mg  takes one and a half daily. Requesting 90 day supply. Last seen 05/28/14.

## 2014-06-02 MED ORDER — PREDNISONE 5 MG PO TABS: 7.5000 mg | ORAL_TABLET | Freq: Every day | ORAL | Status: DC

## 2014-06-02 NOTE — Telephone Encounter (Signed)
Rx sent electronically to pharmacy. 

## 2014-06-02 NOTE — Telephone Encounter (Signed)
May have 90 day +3 refills

## 2014-06-04 LAB — VITAMIN D 25 HYDROXY (VIT D DEFICIENCY, FRACTURES): VIT D 25 HYDROXY: 108 ng/mL — AB (ref 30–89)

## 2014-06-04 LAB — BASIC METABOLIC PANEL
BUN: 15 mg/dL (ref 6–23)
CHLORIDE: 102 meq/L (ref 96–112)
CO2: 28 meq/L (ref 19–32)
CREATININE: 0.81 mg/dL (ref 0.50–1.10)
Calcium: 9.7 mg/dL (ref 8.4–10.5)
Glucose, Bld: 100 mg/dL — ABNORMAL HIGH (ref 70–99)
POTASSIUM: 4.2 meq/L (ref 3.5–5.3)
Sodium: 141 mEq/L (ref 135–145)

## 2014-06-12 ENCOUNTER — Encounter: Payer: Self-pay | Admitting: *Deleted

## 2014-08-24 ENCOUNTER — Other Ambulatory Visit: Payer: Self-pay

## 2014-08-24 MED ORDER — AMLODIPINE BESYLATE 10 MG PO TABS: 10.0000 mg | ORAL_TABLET | Freq: Every day | ORAL | Status: DC

## 2014-08-24 MED ORDER — HYDROXYCHLOROQUINE SULFATE 200 MG PO TABS: 200.0000 mg | ORAL_TABLET | Freq: Two times a day (BID) | ORAL | Status: AC

## 2014-08-24 MED ORDER — LEVOTHYROXINE SODIUM 125 MCG PO TABS: 125.0000 ug | ORAL_TABLET | Freq: Every day | ORAL | Status: DC

## 2014-08-24 MED ORDER — GABAPENTIN 300 MG PO CAPS: 300.0000 mg | ORAL_CAPSULE | Freq: Four times a day (QID) | ORAL | Status: DC

## 2014-08-24 MED ORDER — PREDNISONE 5 MG PO TABS: 7.5000 mg | ORAL_TABLET | Freq: Every day | ORAL | Status: DC

## 2014-08-25 ENCOUNTER — Other Ambulatory Visit: Payer: Self-pay | Admitting: *Deleted

## 2014-08-25 NOTE — Progress Notes (Signed)
Sabrina Adams, not sure of the message?

## 2014-08-25 NOTE — Telephone Encounter (Signed)
This is a prudent +5 additional refills

## 2014-08-25 NOTE — Progress Notes (Signed)
error 

## 2014-08-26 MED ORDER — TRAMADOL HCL 50 MG PO TABS: 50.0000 mg | ORAL_TABLET | Freq: Four times a day (QID) | ORAL | Status: DC | PRN

## 2014-09-06 ENCOUNTER — Telehealth: Payer: Self-pay | Admitting: Family Medicine

## 2014-09-06 NOTE — Telephone Encounter (Signed)
Note: her insurance will not cover Robaxin, that is because studies show muscle relaxers in people at 91 or higher is assocuiated with increase risk of drowsiness and fracture. We can try to get it approved but there is a strong probability it will not be. #1- does pt take this? How? Main reason she takes it? Does it help? Once again we will try to get it approved but I don't guarentee it. Send me the reply thanks

## 2014-09-07 NOTE — Telephone Encounter (Signed)
Pt does take robaxin at least two tablets daily sometimes more. She takes for severe RA and she states she has severe muscle cramps. Pt states this med does help a lot.

## 2014-09-07 NOTE — Telephone Encounter (Signed)
I am perfectly fine with this patient continuing on this medication. If her insurance denies this then lets try to appeal.

## 2014-09-09 ENCOUNTER — Other Ambulatory Visit: Payer: Self-pay

## 2014-09-15 DIAGNOSIS — M810 Age-related osteoporosis without current pathological fracture: Secondary | ICD-10-CM | POA: Diagnosis not present

## 2014-09-15 DIAGNOSIS — M0579 Rheumatoid arthritis with rheumatoid factor of multiple sites without organ or systems involvement: Secondary | ICD-10-CM | POA: Diagnosis not present

## 2014-10-13 ENCOUNTER — Encounter: Payer: Self-pay | Admitting: Family Medicine

## 2014-10-13 ENCOUNTER — Ambulatory Visit (INDEPENDENT_AMBULATORY_CARE_PROVIDER_SITE_OTHER): Payer: Medicare Other | Admitting: Family Medicine

## 2014-10-13 VITALS — BP 138/82 | Ht 65.0 in | Wt 177.2 lb

## 2014-10-13 DIAGNOSIS — E038 Other specified hypothyroidism: Secondary | ICD-10-CM | POA: Diagnosis not present

## 2014-10-13 DIAGNOSIS — E785 Hyperlipidemia, unspecified: Secondary | ICD-10-CM | POA: Diagnosis not present

## 2014-10-13 DIAGNOSIS — I10 Essential (primary) hypertension: Secondary | ICD-10-CM

## 2014-10-13 DIAGNOSIS — Z79899 Other long term (current) drug therapy: Secondary | ICD-10-CM

## 2014-10-13 DIAGNOSIS — R5383 Other fatigue: Secondary | ICD-10-CM

## 2014-10-13 DIAGNOSIS — M069 Rheumatoid arthritis, unspecified: Secondary | ICD-10-CM | POA: Diagnosis not present

## 2014-10-13 MED ORDER — TIZANIDINE HCL 2 MG PO TABS: ORAL_TABLET | ORAL | Status: DC

## 2014-10-13 MED ORDER — MUPIROCIN 2 % EX OINT: 1.0000 "application " | TOPICAL_OINTMENT | Freq: Two times a day (BID) | CUTANEOUS | Status: DC

## 2014-10-13 NOTE — Progress Notes (Signed)
   Subjective:    Patient ID: Sabrina Adams, female    DOB: 11-May-1946, 69 y.o.   MRN: [id number]  Hypertension This is a chronic problem. The current episode started more than 1 year ago. The problem has been gradually improving since onset. The problem is controlled. There are no associated agents to hypertension. There are no known risk factors for coronary artery disease. The current treatment provides significant improvement. There are no compliance problems.    Patient states that her methocarbamol is not being filled by her pharmacy because they tell her this drug is contraindicated in people over the age of 69 now.  Patient states that he copay on the Plaquenil has risen and she would like to discuss alternatives.  Patient states that she needs a refill on mupricion ointment.   Review of Systems     Objective:   Physical Exam Lungs clear heart regular pulse normal extremities no edema skin warm dry  25 minutes spent with patient covering hyperlipidemia, hypertension, high risk medication use, hypothyroidism.     Assessment & Plan:  Chronic muscle spasms and tightness related to her chronic disease issues she uses take it takes methocarbamol to help with the discomfort but this is no longer covered by her insurance. Try Zanaflex 2 mg twice a day when necessary prescription given she will notify us if cheaper alternative  She will discuss with her rheumatologist whether or not they can get her on medication cheaper than Plaquenil  Blood pressure stable continue current measures. Check cholesterol panel and liver panel Also check thyroid function She ought to follow-up within the next 3-4 months if she needs her pain medicine before then she will call us Continue medication as directed heart heart healthy diet.

## 2014-10-14 DIAGNOSIS — E785 Hyperlipidemia, unspecified: Secondary | ICD-10-CM | POA: Diagnosis not present

## 2014-10-14 DIAGNOSIS — Z79899 Other long term (current) drug therapy: Secondary | ICD-10-CM | POA: Diagnosis not present

## 2014-10-14 DIAGNOSIS — E038 Other specified hypothyroidism: Secondary | ICD-10-CM | POA: Diagnosis not present

## 2014-10-14 DIAGNOSIS — R5383 Other fatigue: Secondary | ICD-10-CM | POA: Diagnosis not present

## 2014-10-14 DIAGNOSIS — M069 Rheumatoid arthritis, unspecified: Secondary | ICD-10-CM | POA: Diagnosis not present

## 2014-10-15 LAB — CBC WITH DIFFERENTIAL/PLATELET
BASOS ABS: 0 10*3/uL (ref 0.0–0.2)
Basos: 0 %
Eos: 1 %
Eosinophils Absolute: 0 10*3/uL (ref 0.0–0.4)
HCT: 43.8 % (ref 34.0–46.6)
Hemoglobin: 14.5 g/dL (ref 11.1–15.9)
IMMATURE GRANULOCYTES: 0 %
Immature Grans (Abs): 0 10*3/uL (ref 0.0–0.1)
LYMPHS: 41 %
Lymphocytes Absolute: 1.9 10*3/uL (ref 0.7–3.1)
MCH: 30.5 pg (ref 26.6–33.0)
MCHC: 33.1 g/dL (ref 31.5–35.7)
MCV: 92 fL (ref 79–97)
Monocytes Absolute: 0.6 10*3/uL (ref 0.1–0.9)
Monocytes: 14 %
NEUTROS ABS: 2 10*3/uL (ref 1.4–7.0)
Neutrophils Relative %: 44 %
Platelets: 276 10*3/uL (ref 150–379)
RBC: 4.75 x10E6/uL (ref 3.77–5.28)
RDW: 13.8 % (ref 12.3–15.4)
WBC: 4.5 10*3/uL (ref 3.4–10.8)

## 2014-10-15 LAB — HEPATIC FUNCTION PANEL
ALK PHOS: 68 IU/L (ref 39–117)
ALT: 23 IU/L (ref 0–32)
AST: 28 IU/L (ref 0–40)
Albumin: 4.5 g/dL (ref 3.6–4.8)
BILIRUBIN TOTAL: 0.3 mg/dL (ref 0.0–1.2)
Bilirubin, Direct: 0.09 mg/dL (ref 0.00–0.40)
Total Protein: 6.6 g/dL (ref 6.0–8.5)

## 2014-10-15 LAB — TSH: TSH: 0.437 u[IU]/mL — ABNORMAL LOW (ref 0.450–4.500)

## 2014-10-15 LAB — LIPID PANEL
Chol/HDL Ratio: 2.6 ratio units (ref 0.0–4.4)
Cholesterol, Total: 236 mg/dL — ABNORMAL HIGH (ref 100–199)
HDL: 90 mg/dL (ref >39)
LDL Calculated: 126 mg/dL — ABNORMAL HIGH (ref 0–99)
TRIGLYCERIDES: 101 mg/dL (ref 0–149)
VLDL CHOLESTEROL CAL: 20 mg/dL (ref 5–40)

## 2014-10-20 MED ORDER — LEVOTHYROXINE SODIUM 112 MCG PO TABS: 112.0000 ug | ORAL_TABLET | Freq: Every day | ORAL | Status: DC

## 2014-10-20 NOTE — Progress Notes (Signed)
BW orders are in for pt to repeat in 3-4 months. Pt said she will call back to schedule OV. Pt notified and verbalized understanding of test results.

## 2014-10-20 NOTE — Addendum Note (Signed)
Addended byCharolotte Capuchin D on: 10/20/2014 11:03 AM   Modules accepted: Orders

## 2014-11-10 ENCOUNTER — Telehealth: Payer: Self-pay | Admitting: Family Medicine

## 2014-11-10 NOTE — Telephone Encounter (Signed)
Rx prior auth for pt's promethazine (PHENERGAN) 25 MG tablet is APPROVED for # 30 tablets/30 days, expires 08/07/15

## 2014-11-25 ENCOUNTER — Encounter (INDEPENDENT_AMBULATORY_CARE_PROVIDER_SITE_OTHER): Payer: Self-pay | Admitting: *Deleted

## 2014-12-11 ENCOUNTER — Telehealth: Payer: Self-pay | Admitting: Family Medicine

## 2014-12-11 MED ORDER — FLUCONAZOLE 150 MG PO TABS: 150.0000 mg | ORAL_TABLET | Freq: Every day | ORAL | Status: DC

## 2014-12-11 NOTE — Telephone Encounter (Signed)
Diflucan sent to pharmacy per protocol. Patient was notified.

## 2014-12-11 NOTE — Telephone Encounter (Signed)
Pt would like some fluconazole called in for a yeast infection she  Can't kick on her own please  [place]

## 2014-12-30 DIAGNOSIS — H5203 Hypermetropia, bilateral: Secondary | ICD-10-CM | POA: Diagnosis not present

## 2014-12-30 DIAGNOSIS — H52223 Regular astigmatism, bilateral: Secondary | ICD-10-CM | POA: Diagnosis not present

## 2014-12-30 DIAGNOSIS — H4011X2 Primary open-angle glaucoma, moderate stage: Secondary | ICD-10-CM | POA: Diagnosis not present

## 2014-12-30 DIAGNOSIS — H524 Presbyopia: Secondary | ICD-10-CM | POA: Diagnosis not present

## 2014-12-31 ENCOUNTER — Other Ambulatory Visit: Payer: Self-pay | Admitting: Family Medicine

## 2014-12-31 ENCOUNTER — Telehealth: Payer: Self-pay | Admitting: *Deleted

## 2015-01-27 DIAGNOSIS — H4011X2 Primary open-angle glaucoma, moderate stage: Secondary | ICD-10-CM | POA: Diagnosis not present

## 2015-03-04 ENCOUNTER — Ambulatory Visit (INDEPENDENT_AMBULATORY_CARE_PROVIDER_SITE_OTHER): Payer: Medicare Other | Admitting: Internal Medicine

## 2015-03-04 ENCOUNTER — Encounter (INDEPENDENT_AMBULATORY_CARE_PROVIDER_SITE_OTHER): Payer: Self-pay | Admitting: Internal Medicine

## 2015-03-04 VITALS — BP 144/60 | HR 80 | Temp 97.9°F | Ht 64.0 in | Wt 169.0 lb

## 2015-03-04 DIAGNOSIS — K219 Gastro-esophageal reflux disease without esophagitis: Secondary | ICD-10-CM | POA: Diagnosis not present

## 2015-03-04 NOTE — Patient Instructions (Signed)
OV in 1 year. Continue the Nexium.

## 2015-03-04 NOTE — Progress Notes (Signed)
Subjective:    Patient ID: Sabrina Adams, female    DOB: August 23, 1945, 69 y.o.   MRN: [id number]  HPI Here today for f/u of her GERD. As long as she takes her Nexium she does good.  She does take extra antacids if she eats pizza or spaghetti Hx of RA. Rt leg in a brace. She usually has a BM and takes MIralax twice a week.  She usually has a BM every 3-4 days. No melena or BRRB. No change in her stools. Her appetite is good. No weight loss.     Review of Systems Two children in good health. Married. Retired Education officer, [employer].     Past Medical History  Diagnosis Date  . Complication of anesthesia     nausea ,  psycotics episodes  . Hypertension   . Hypothyroidism   . Sleep apnea   . GERD (gastroesophageal reflux disease)   . Neuromuscular disorder     neuropathy  legs  . Arthritis     rheumatoid  . Anemia   . Stroke     mild  . RA (rheumatoid arthritis)     Past Surgical History  Procedure Laterality Date  . Lumbar spine surgery  2005  . Abdominal hysterectomy  2005  . Colon surgery      colostomy then revision  . Hand arthroplasty    . Ankle surgery    . Colonoscopy  04/04/2012    Procedure: COLONOSCOPY;  Surgeon: Rogene Houston, MD;  Location: [facility];  Service: Endoscopy;  Laterality: N/A;  410-Ok per [place]  . Esophagogastroduodenoscopy  04/04/2012    Procedure: ESOPHAGOGASTRODUODENOSCOPY (EGD);  Surgeon: Rogene Houston, MD;  Location: [facility];  Service: Endoscopy;  Laterality: N/A;  possible  . Abdominal surgery      2006 colectomy for diverticulitis    Allergies  Allergen Reactions  . Betadine [Povidone Iodine] Itching    "Burns"  . Cinnamon Anaphylaxis    Throat and Tongue   . Codeine Nausea Only    Patient can become "psychotic"  . Other Nausea And Vomiting and Other (See Comments)    Pt states "all narcotic pain medications make me sick to my stomach and sometimes make me psychotic"  . Actonel [Risedronate Sodium]     stomach  .  Adhesive [Tape] Other (See Comments)    Irritates skin   . Lidocaine   . Methotrexate Derivatives     Elevated liver enzymes    Current Outpatient Prescriptions on File Prior to Visit  Medication Sig Dispense Refill  . acetaminophen (TYLENOL) 500 MG tablet Take 500 mg by mouth 2 (two) times daily.     [name] acetaminophen (TYLENOL) 650 MG CR tablet Take 650 mg by mouth 2 (two) times daily.     [name] amLODipine (NORVASC) 10 MG tablet Take 1 tablet (10 mg total) by mouth daily. 90 tablet 1  . b complex vitamins tablet Take 1 tablet by mouth daily.    . Calcium Carbonate-Vitamin D (CALCIUM 600 + D PO) Take 1 tablet by mouth 2 (two) times daily.    [name] gabapentin (NEURONTIN) 300 MG capsule Take 1 capsule (300 mg total) by mouth 4 (four) times daily. 360 capsule 1  . HYDROmorphone (DILAUDID) 2 MG tablet Take 1 tablet (2 mg total) by mouth every 4 (four) hours as needed. Pain 30 tablet 0  . hydroxychloroquine (PLAQUENIL) 200 MG tablet Take 1 tablet (200 mg total) by mouth 2 (two) times daily. [place]  tablet 1  . levothyroxine (SYNTHROID, LEVOTHROID) 112 MCG tablet TAKE 1 TABLET EVERY DAY (Patient taking differently: 160mcg) 90 tablet 2  . Multiple Vitamin (MULTIVITAMIN WITH MINERALS) TABS Take 1 tablet by mouth daily.    . mupirocin ointment (BACTROBAN) 2 % Place 1 application into the nose 2 (two) times daily. 30 g 6  . polyethylene glycol (MIRALAX / GLYCOLAX) packet Take 17 g by mouth daily as needed. Constipation    . polyethylene glycol powder (GLYCOLAX/MIRALAX) powder MIX 1 CAPFUL (17 GRAMS) WITH 8OZ OF WATER OR JUICE DAILY. 527 g PRN  . predniSONE (DELTASONE) 2.5 MG tablet Take 7.5 mg by mouth daily with breakfast.    . Probiotic Product (PROBIOTIC DAILY PO) Take by mouth daily.    . promethazine (PHENERGAN) 25 MG tablet Take 0.5 tablets (12.5 mg total) by mouth 2 (two) times daily as needed. 90 tablet 3  . temazepam (RESTORIL) 30 MG capsule Take 1 capsule (30 mg total) by mouth at bedtime as needed. Sleep  30 capsule 5  . tiZANidine (ZANAFLEX) 2 MG tablet 1 bid prn 180 tablet 2  . traMADol (ULTRAM) 50 MG tablet Take 1 tablet (50 mg total) by mouth every 6 (six) hours as needed. Pain 120 tablet 5  . Zoledronic Acid (RECLAST IV) Inject into the vein.    . Cholecalciferol (VITAMIN D3) 5000 UNITS TABS Take by mouth. Once a week    . docusate sodium (COLACE) 100 MG capsule Take 200 mg by mouth at bedtime.    [name] esomeprazole (NEXIUM) 40 MG capsule Take 40 mg by mouth daily before breakfast.    . fluconazole (DIFLUCAN) 150 MG tablet Take 1 tablet (150 mg total) by mouth daily. 3 days apart (Patient not taking: Reported on 03/04/2015) 2 tablet 0  . psyllium (REGULOID) 0.52 G capsule Take 0.52 g by mouth daily.     No current facility-administered medications on file prior to visit.     Objective:   Physical Exam Blood pressure 144/60, pulse 80, temperature 97.9 F (36.6 C), height 5\' 4"  (1.626 m), weight 169 lb (76.658 kg).   Alert and oriented. Skin warm and dry. Oral mucosa is moist.   . Sclera anicteric, conjunctivae is pink. Thyroid not enlarged. No cervical lymphadenopathy. Lungs clear. Heart regular rate and rhythm.  Abdomen is soft. Bowel sounds are positive. No hepatomegaly. No abdominal masses felt. No tenderness.  No edema to lower extremities. Patient is alert and oriented.     Assessment & Plan:  GERD, controlled . No GI symptoms. OV in 1 year.

## 2015-03-31 DIAGNOSIS — M0579 Rheumatoid arthritis with rheumatoid factor of multiple sites without organ or systems involvement: Secondary | ICD-10-CM | POA: Diagnosis not present

## 2015-03-31 DIAGNOSIS — M810 Age-related osteoporosis without current pathological fracture: Secondary | ICD-10-CM | POA: Diagnosis not present

## 2015-04-07 DIAGNOSIS — M810 Age-related osteoporosis without current pathological fracture: Secondary | ICD-10-CM | POA: Diagnosis not present

## 2015-04-13 ENCOUNTER — Other Ambulatory Visit: Payer: Self-pay | Admitting: Family Medicine

## 2015-04-13 NOTE — Telephone Encounter (Signed)
[place] to see, six mo ago wanted to see in three to four months

## 2015-04-13 NOTE — Telephone Encounter (Signed)
May have this +2 refills, patient to schedule office visit for September no later than October

## 2015-05-03 ENCOUNTER — Other Ambulatory Visit: Payer: Self-pay | Admitting: Family Medicine

## 2015-05-03 DIAGNOSIS — Z1231 Encounter for screening mammogram for malignant neoplasm of breast: Secondary | ICD-10-CM

## 2015-05-06 DIAGNOSIS — H4011X4 Primary open-angle glaucoma, indeterminate stage: Secondary | ICD-10-CM | POA: Diagnosis not present

## 2015-05-21 ENCOUNTER — Encounter: Payer: Self-pay | Admitting: Family Medicine

## 2015-05-21 ENCOUNTER — Ambulatory Visit (INDEPENDENT_AMBULATORY_CARE_PROVIDER_SITE_OTHER): Payer: Medicare Other | Admitting: Family Medicine

## 2015-05-21 VITALS — BP 118/74 | Ht 65.0 in | Wt 173.0 lb

## 2015-05-21 DIAGNOSIS — E039 Hypothyroidism, unspecified: Secondary | ICD-10-CM

## 2015-05-21 DIAGNOSIS — E559 Vitamin D deficiency, unspecified: Secondary | ICD-10-CM

## 2015-05-21 DIAGNOSIS — I10 Essential (primary) hypertension: Secondary | ICD-10-CM

## 2015-05-21 DIAGNOSIS — Z23 Encounter for immunization: Secondary | ICD-10-CM

## 2015-05-21 DIAGNOSIS — M25572 Pain in left ankle and joints of left foot: Secondary | ICD-10-CM

## 2015-05-21 MED ORDER — AMLODIPINE BESYLATE 10 MG PO TABS: 10.0000 mg | ORAL_TABLET | Freq: Every day | ORAL | Status: DC

## 2015-05-21 MED ORDER — TRAMADOL HCL 50 MG PO TABS: 50.0000 mg | ORAL_TABLET | Freq: Four times a day (QID) | ORAL | Status: DC | PRN

## 2015-05-21 MED ORDER — LEVOTHYROXINE SODIUM 112 MCG PO TABS: 112.0000 ug | ORAL_TABLET | Freq: Every day | ORAL | Status: DC

## 2015-05-21 MED ORDER — TIZANIDINE HCL 2 MG PO TABS: ORAL_TABLET | ORAL | Status: DC

## 2015-05-21 MED ORDER — POLYETHYLENE GLYCOL 3350 17 GM/SCOOP PO POWD: ORAL | Status: DC

## 2015-05-21 NOTE — Progress Notes (Addendum)
   Subjective:    Patient ID: Sabrina Adams, female    DOB: 18-Jul-1946, 69 y.o.   MRN: [id number]  Hyperlipidemia This is a chronic problem. The current episode started more than 1 year ago. Pertinent negatives include no chest pain.   Needs refills on levothyroxine, amlodipine, tramadol, zanaflex humanan. Refill miralax to [place] [facility].   patient relates left ankle becoming weaker having more difficult time would like to see orthopedist Patient relates using muscle relaxers intermittently denies a causing drowsiness Patient relates taking thyroid medicine on a regular basis does help her she feels her energy is doing well Taking blood pressure medicine on a regular basis denies a causing any trouble she states it keeps her blood pressure under good control she does try to watch her diet  patient uses tramadol for pain and discomfort does not cause drowsiness or difficulty. Patient not using allotted. Has it just in case of severe pain. Flu vaccine today.     Review of Systems  Constitutional: Negative for activity change, appetite change and fatigue.  HENT: Negative for congestion.   Respiratory: Negative for cough.   Cardiovascular: Negative for chest pain.  Gastrointestinal: Negative for abdominal pain.  Endocrine: Negative for polydipsia and polyphagia.  Neurological: Negative for weakness.  Psychiatric/Behavioral: Negative for confusion.       Objective:   Physical Exam  Constitutional: She appears well-nourished. No distress.  Cardiovascular: Normal rate, regular rhythm and normal heart sounds.   No murmur heard. Pulmonary/Chest: Effort normal and breath sounds normal. No respiratory distress.  Musculoskeletal: She exhibits no edema.  Lymphadenopathy:    She has no cervical adenopathy.  Neurological: She is alert. She exhibits normal muscle tone.  Psychiatric: Her behavior is normal.  Vitals reviewed.   severe rheumatoid arthritis noted , patient also with  increase inversion of the left ankle with subjective discomfort  It should be noted that the patient does use orthopedic hardware to help her with her severe rheumatoid arthritis in her legs. These hardware are medically necessary.     Assessment & Plan:  1. Encounter for immunization  flu vaccine today  2. Hypothyroidism, unspecified hypothyroidism type  thyroid medicine on a regular basis. Check TSH - TSH - Ambulatory referral to Orthopedic Surgery  3. Essential hypertension, benign  blood pressure good control continue current measures check metabolic 7 - Basic metabolic panel - Ambulatory referral to Orthopedic Surgery  4. Vitamin D deficiency Vitamin D deficiency check vitamin D level - Vit D  25 hydroxy (rtn osteoporosis monitoring) - Ambulatory referral to Orthopedic Surgery  5. Pain in joint, ankle and foot, left Pain in ankle referral to orthopedics for further evaluation - Ambulatory referral to Orthopedic Surgery  Rheumatoid arthritis patient currently not seen rheumatologist she states she is getting set up with a new one  Pain control tramadol does a good job pain control she denies abusing it follow-up again in 3 months 25 minutes was spent with the patient. Greater than half the time was spent in discussion and answering questions and counseling regarding the issues that the patient came in for today. L2 5

## 2015-05-22 LAB — BASIC METABOLIC PANEL
BUN / CREAT RATIO: 25 (ref 11–26)
BUN: 20 mg/dL (ref 8–27)
CHLORIDE: 97 mmol/L (ref 97–108)
CO2: 28 mmol/L (ref 18–29)
Calcium: 10.3 mg/dL (ref 8.7–10.3)
Creatinine, Ser: 0.8 mg/dL (ref 0.57–1.00)
GFR calc Af Amer: 88 mL/min/{1.73_m2} (ref >59)
GFR calc non Af Amer: 77 mL/min/{1.73_m2} (ref >59)
GLUCOSE: 107 mg/dL — AB (ref 65–99)
Potassium: 4.7 mmol/L (ref 3.5–5.2)
SODIUM: 140 mmol/L (ref 134–144)

## 2015-05-22 LAB — TSH: TSH: 0.646 u[IU]/mL (ref 0.450–4.500)

## 2015-05-22 LAB — VITAMIN D 25 HYDROXY (VIT D DEFICIENCY, FRACTURES): VIT D 25 HYDROXY: 55.2 ng/mL (ref 30.0–100.0)

## 2015-05-23 ENCOUNTER — Encounter: Payer: Self-pay | Admitting: Family Medicine

## 2015-05-24 ENCOUNTER — Ambulatory Visit (HOSPITAL_COMMUNITY)
Admission: RE | Admit: 2015-05-24 | Discharge: 2015-05-24 | Disposition: A | Discharge status: Home / Self Care | Payer: Medicare Other | Source: Ambulatory Visit | Attending: Family Medicine | Admitting: Family Medicine

## 2015-05-24 DIAGNOSIS — Z1231 Encounter for screening mammogram for malignant neoplasm of breast: Secondary | ICD-10-CM

## 2015-06-03 DIAGNOSIS — H401124 Primary open-angle glaucoma, left eye, indeterminate stage: Secondary | ICD-10-CM | POA: Diagnosis not present

## 2015-06-08 ENCOUNTER — Encounter: Payer: Self-pay | Admitting: Family Medicine

## 2015-07-09 DIAGNOSIS — M25571 Pain in right ankle and joints of right foot: Secondary | ICD-10-CM | POA: Diagnosis not present

## 2015-07-09 DIAGNOSIS — M25572 Pain in left ankle and joints of left foot: Secondary | ICD-10-CM | POA: Diagnosis not present

## 2015-07-09 DIAGNOSIS — M2142 Flat foot [pes planus] (acquired), left foot: Secondary | ICD-10-CM | POA: Diagnosis not present

## 2015-07-09 DIAGNOSIS — M2141 Flat foot [pes planus] (acquired), right foot: Secondary | ICD-10-CM | POA: Diagnosis not present

## 2015-07-22 ENCOUNTER — Ambulatory Visit (HOSPITAL_COMMUNITY): Payer: Medicare Other | Admitting: Physical Therapy

## 2015-07-30 ENCOUNTER — Encounter (HOSPITAL_COMMUNITY): Payer: Medicare Other

## 2015-08-03 ENCOUNTER — Ambulatory Visit (HOSPITAL_COMMUNITY): Payer: Medicare Other | Attending: Student | Admitting: Physical Therapy

## 2015-08-03 DIAGNOSIS — M76829 Posterior tibial tendinitis, unspecified leg: Secondary | ICD-10-CM

## 2015-08-03 DIAGNOSIS — M259 Joint disorder, unspecified: Secondary | ICD-10-CM | POA: Insufficient documentation

## 2015-08-03 DIAGNOSIS — R262 Difficulty in walking, not elsewhere classified: Secondary | ICD-10-CM | POA: Diagnosis not present

## 2015-08-03 DIAGNOSIS — M629 Disorder of muscle, unspecified: Secondary | ICD-10-CM | POA: Insufficient documentation

## 2015-08-03 DIAGNOSIS — R29898 Other symptoms and signs involving the musculoskeletal system: Secondary | ICD-10-CM

## 2015-08-03 DIAGNOSIS — R2681 Unsteadiness on feet: Secondary | ICD-10-CM | POA: Diagnosis not present

## 2015-08-03 DIAGNOSIS — M25572 Pain in left ankle and joints of left foot: Secondary | ICD-10-CM | POA: Insufficient documentation

## 2015-08-03 NOTE — Patient Instructions (Signed)
Ankle Pumps    Lie with knees supported on bolster or sit. Straighten one knee. Keep knee straight; alternately press out and pull up heel. Emphasize movement of heel. Repeat _10-15 __ times. Hold it 2-3 seconds per position. Repeat 2x/day.     Copyright  VHI. All rights reserved.     SIDELYING INVERSION  Start by lying on your side with the target ankle on the bottom. You should be lying so that your foot and ankle are off the edge of the bed or table. Next, move your ankle and foot upwards towards the ceiling as shown.   Hold for 2-3 seconds.   Repeat 10-15 times, twice a day.   Repeat 10-15 times, twice a day.     SIDELYING EVERSION  Start by lying on your side with the target ankle on top. You should be lying so that your foot and ankle are off the edge of the bed or table. Next, move your ankle and foot upwards towards the ceiling as shown.    Hold for 2-3 second.  Repeat 10-15 times, twice a day.    HEEL RAISE - PLANTARFLEXION  Start with your entire foot on the ground.  Next, raise up your heel as you press your toes down.  Keep your toes on the ground the entire time.  Repeat 10 times, twice a day.

## 2015-08-03 NOTE — Therapy (Signed)
[place], [place], [address] Phone: [phone]   Fax:  [phone]  Physical Therapy Evaluation  Patient Details  Name: Sabrina Adams MRN: [id number] Date of Birth: 11-16-1945 Referring Provider: Mechele Claude [facility]   Encounter Date: 08/03/2015      PT End of Session - 08/03/15 1752    Visit Number 1   Number of Visits 12   Date for PT Re-Evaluation 08/31/15   Authorization Type Medicare/AARP UHC supplemental plan f    Authorization Time Period 08/03/15 to 10/04/15   Authorization - Visit Number 1   Authorization - Number of Visits 10   PT Start Time [id number]   PT Stop Time [id number]   PT Time Calculation (min) 48 min   Activity Tolerance Patient limited by pain;Patient tolerated treatment well   Behavior During Therapy [facility] for tasks assessed/performed      Past Medical History  Diagnosis Date  . Complication of anesthesia     nausea ,  psycotics episodes  . Hypertension   . Hypothyroidism   . Sleep apnea   . GERD (gastroesophageal reflux disease)   . Neuromuscular disorder (HCC)     neuropathy  legs  . Arthritis     rheumatoid  . Anemia   . Stroke (HCC)     mild  . RA (rheumatoid arthritis) [facility])     Past Surgical History  Procedure Laterality Date  . Lumbar spine surgery  2005  . Abdominal hysterectomy  2005  . Colon surgery      colostomy then revision  . Hand arthroplasty    . Ankle surgery    . Colonoscopy  04/04/2012    Procedure: COLONOSCOPY;  Surgeon: Rogene Houston, MD;  [facility];  Service: Endoscopy;  Laterality: N/A;  410-Ok per [place]  . Esophagogastroduodenoscopy  04/04/2012    Procedure: ESOPHAGOGASTRODUODENOSCOPY (EGD);  Surgeon: Rogene Houston, MD;  Location: [facility];  Service: Endoscopy;  Laterality: N/A;  possible  . Abdominal surgery      2006 colectomy for diverticulitis    There were no vitals filed for this visit.  Visit Diagnosis:  Posterior  tibialis muscle dysfunction - Plan: PT plan of care cert/re-cert  Left ankle pain - Plan: PT plan of care cert/re-cert  Ankle weakness - Plan: PT plan of care cert/re-cert  Difficulty walking - Plan: PT plan of care cert/re-cert  Unsteadiness - Plan: PT plan of care cert/re-cert      Subjective Assessment - 08/03/15 1650    Subjective Ankle usually does not bother her on the L, no pain or symptoms until it collapses. Frequencies of collapses depends on how much walking she is doing, feels that it is starting to happen more.    Pertinent History Has a history of extensive R ankle surgery that was botched initially, second surgery did not correct problem- R leg is shorter due to this structurally. Patient reports that her L foot, which is what she has been referred for, usually feels fine but will occasionally pop and collapse on her, seems to happen more when she is outdoors.  Has an appontment for orthotics for L ankle scheduled.    How long can you stand comfortably? 12/27- 5 minutes    How long can you walk comfortably? 12/27- less than 111ft    Patient Stated Goals strengthen ankle    Currently in Pain? No/denies  [facility] PT Assessment - 08/03/15 0001    Assessment   Medical Diagnosis posterior tib tendon dysfunction    Referring Provider Mechele Claude [facility]    Onset Date/Surgical Date --  chronic    Next MD Visit not scheduled until finished with PT    Precautions   Precautions Other (comment)   Precaution Comments brace for R ankle   Restrictions   Weight Bearing Restrictions No   Balance Screen   Has the patient fallen in the past 6 months No   Has the patient had a decrease in activity level because of a fear of falling?  No   Is the patient reluctant to leave their home because of a fear of falling?  Yes   Prior Function   Level of Independence Independent with basic ADLs;Independent;Independent with gait;Independent with transfers   Vocation Part time  employment   Geneticist, molecular    Leisure gardening   Observation/Other Assessments   Focus on Therapeutic Outcomes (FOTO)  66% limited    Posture/Postural Control   Posture Comments scoliosis, proximal muscle weakness, reduced gait speed, reduced rotation of trunk and pelvis    AROM   Right Ankle Dorsiflexion 14   Right Ankle Plantar Flexion --  wfl    Right Ankle Inversion 30   Right Ankle Eversion 15   Left Ankle Dorsiflexion 4   Left Ankle Plantar Flexion 26   Left Ankle Inversion -10   Left Ankle Eversion 50   Strength   Right Hip Flexion 3+/5   Right Hip Extension 2+/5   Right Hip ABduction 4-/5   Left Hip Flexion 3+/5   Left Hip Extension 2/5   Left Hip ABduction 3-/5   Right Knee Flexion 2+/5   Right Knee Extension 4-/5   Left Knee Flexion 2+/5   Left Knee Extension 4-/5   Right Ankle Dorsiflexion 4/5   Right Ankle Inversion 4-/5   Right Ankle Eversion 4-/5   Left Ankle Dorsiflexion 4/5   Left Ankle Inversion 4+/5   Left Ankle Eversion 4+/5   Palpation   Palpation comment general laxity of joints in L foot; did not note any post tib specific pain or tenderness upon palpation    6 minute walk test results    Aerobic Endurance Distance Walked 226   Endurance additional comments 3MWT, cane; gait speed 0.57m/s                            PT Education - 08/03/15 1752    Education provided Yes   Education Details prognosis, plan of care, HEP    Person(s) Educated Patient   Methods Explanation;Demonstration;Handout   Comprehension Verbalized understanding;Returned demonstration          PT Short Term Goals - 08/03/15 1800    PT SHORT TERM GOAL #1   Title Patient to be able to perform TUG balance test in 12 seconds or less with cane in order to demonstrate reduced fall risk and improved overall mobility    Time 3   Period Weeks   Status New   PT SHORT TERM GOAL #2   Title Patient will demosntrate improved overall gait  mechanics with improved ability to bear weight equally, improved posture during gait, improved gait speed, and improved heel-toe pattern in order to promote improved efficiency with mobiltiy    Time 3   Period Weeks   Status New   PT SHORT TERM GOAL #3  Title Patient will be able to verbally describe the importance of proper foot wear and bracing/orthotics in order to enhance patient's self-efficacy and knowledge in managing condition    Time 3   Period Weeks   Status New   PT SHORT TERM GOAL #4   Title Patient will be independent in correctly and consistently perform appropraite HEP, to be updated PRN    Time 3   Period Weeks   Status New           PT Long Term Goals - 08/03/15 1806    PT LONG TERM GOAL #1   Title Patient will demonstrate 5/5 strength in all tested muscles in order to reduce chance of collapse and improve overall mobility    Time 6   Period Weeks   Status New   PT LONG TERM GOAL #2   Title Patient will be able to tolerate performing 6 minute walk test, with cane and unlimited rest breaks, in order to demonstrate improved overall endurance and mobility    Time 6   Period Weeks   Status New   PT LONG TERM GOAL #3   Title Patient to be able to tolerate shopping in grocery store with cane, no ankle collapse, and minimal unsteadiness in order to assist in improving her overall function and community participation    Time 6   Period Weeks   Status New   PT LONG TERM GOAL #4   Title Patient will report no collapse of L ankle within the past 3 weeks in order to reduce overall fall risk, potential risk of damage to tissues, and to improve overall mobility    Time 6   Period Weeks   Status New               Plan - 08/03/15 1754    Clinical Impression Statement Patient presents with chronic problems with collapse of L ankle, which she reports tends to be worse if she has been walking more or has been walking over uneven surfaces; patient also reports she  has had a few close calls with falls.  It is improtant to note that her R ankle/foot are severely deformed due to a mistake during surgery and that this is a chronic problem, not to be addressed by skilled PT services at this time. Upon examination noted some ankle weakness and some stiffness on L, however did not note tenderness to posterior tib with focused palpation along path of muscle as might be expected with posterior tib pathology, although collapses of foot/ankle do suggest involvement of this muscle in some aspect. Also noted general foot/ankle laxity on L, which is likely contributing to collapses when they do happen. At this time recommend a trial of skilled PT services to attempt to address functional limitations.    Pt will benefit from skilled therapeutic intervention in order to improve on the following deficits Abnormal gait;Decreased endurance;Hypomobility;Decreased activity tolerance;Decreased strength;Pain;Difficulty walking;Decreased mobility;Decreased balance;Improper body mechanics;Decreased coordination;Impaired flexibility;Postural dysfunction   Rehab Potential Fair   Clinical Impairments Affecting Rehab Potential chronicity of problem; multiple medical and body structural factors (R foot/ankle, scoliosis, profound weakness, limited ability to participate in activity) contributing to issue    PT Frequency 2x / week   PT Duration 6 weeks   PT Treatment/Interventions ADLs/Self Care Home Management;Gait training;Functional mobility training;Therapeutic activities;Therapeutic exercise;Balance training;Neuromuscular re-education;Patient/family education;Energy conservation;Taping   PT Next Visit Plan review HEP and goals. Direct manual to L foot/ankle not recommended due to inherent laxity. Focus on  strength, balance, gait. Perform TUG next session.    PT Home Exercise Plan given    Recommended Other Services possible brace for L ankle to assist in preventing collapse    Consulted and  Agree with Plan of Care Patient          G-Codes - 2015/08/22 1809    Functional Assessment Tool Used Per FOTO 66% limited    Functional Limitation Mobility: Walking and moving around   Mobility: Walking and Moving Around Current Status [phone]) At least 60 percent but less than 80 percent impaired, limited or restricted   Mobility: Walking and Moving Around Goal Status [phone]) At least 40 percent but less than 60 percent impaired, limited or restricted       Problem List Patient Active Problem List   Diagnosis Date Noted  . Osteopenia 02/16/2014  . Hyperlipidemia 04/22/2013  . Hypothyroidism 04/22/2013  . GERD (gastroesophageal reflux disease) 01/01/2013  . Rheumatoid arthritis ([place]) 12/20/2012  . Essential hypertension, benign 12/20/2012  . Chronic pain syndrome 12/20/2012    Deniece Ree PT, DPT [facility] [address] [place], [place], [address] Phone: [phone]   Fax:  [phone]  Name: Sabrina Adams MRN: [id number] Date of Birth: 09-29-1945

## 2015-08-04 DIAGNOSIS — H401121 Primary open-angle glaucoma, left eye, mild stage: Secondary | ICD-10-CM | POA: Diagnosis not present

## 2015-08-05 ENCOUNTER — Ambulatory Visit (HOSPITAL_COMMUNITY): Payer: Medicare Other | Admitting: Physical Therapy

## 2015-08-05 DIAGNOSIS — M76829 Posterior tibial tendinitis, unspecified leg: Secondary | ICD-10-CM

## 2015-08-05 DIAGNOSIS — M25572 Pain in left ankle and joints of left foot: Secondary | ICD-10-CM

## 2015-08-05 DIAGNOSIS — M629 Disorder of muscle, unspecified: Secondary | ICD-10-CM | POA: Diagnosis not present

## 2015-08-05 DIAGNOSIS — R262 Difficulty in walking, not elsewhere classified: Secondary | ICD-10-CM

## 2015-08-05 DIAGNOSIS — M259 Joint disorder, unspecified: Secondary | ICD-10-CM | POA: Diagnosis not present

## 2015-08-05 DIAGNOSIS — R2681 Unsteadiness on feet: Secondary | ICD-10-CM

## 2015-08-05 DIAGNOSIS — R29898 Other symptoms and signs involving the musculoskeletal system: Secondary | ICD-10-CM

## 2015-08-05 NOTE — Therapy (Signed)
[place], [place], [address] Phone: [phone]   Fax:  [phone]  Physical Therapy Treatment  Patient Details  Name: Sabrina Adams MRN: [id number] Date of Birth: January 08, 1946 Referring Provider: Mechele Claude [facility]   Encounter Date: 08/05/2015      PT End of Session - 08/05/15 1644    Visit Number 2   Number of Visits 12   Date for PT Re-Evaluation 08/31/15   Authorization Type Medicare/AARP UHC supplemental plan f    Authorization Time Period 08/03/15 to 10/04/15   Authorization - Visit Number 2   Authorization - Number of Visits 10   PT Start Time 1601   PT Stop Time 1639   PT Time Calculation (min) 38 min   Activity Tolerance Patient tolerated treatment well;Patient limited by pain   Behavior During Therapy [facility] for tasks assessed/performed      Past Medical History  Diagnosis Date  . Complication of anesthesia     nausea ,  psycotics episodes  . Hypertension   . Hypothyroidism   . Sleep apnea   . GERD (gastroesophageal reflux disease)   . Neuromuscular disorder (HCC)     neuropathy  legs  . Arthritis     rheumatoid  . Anemia   . Stroke (HCC)     mild  . RA (rheumatoid arthritis) [facility])     Past Surgical History  Procedure Laterality Date  . Lumbar spine surgery  2005  . Abdominal hysterectomy  2005  . Colon surgery      colostomy then revision  . Hand arthroplasty    . Ankle surgery    . Colonoscopy  04/04/2012    Procedure: COLONOSCOPY;  Surgeon: Rogene Houston, MD;  Location[facility];  Service: Endoscopy;  Laterality: N/A;  410-Ok per [place]  . Esophagogastroduodenoscopy  04/04/2012    Procedure: ESOPHAGOGASTRODUODENOSCOPY (EGD);  Surgeon: Rogene Houston, MD;  Location[facility];  Service: Endoscopy;  Laterality: N/A;  possible  . Abdominal surgery      2006 colectomy for diverticulitis    There were no vitals filed for this visit.  Visit Diagnosis:  Posterior  tibialis muscle dysfunction  Left ankle pain  Ankle weakness  Difficulty walking  Unsteadiness          [facility] PT Assessment - 08/05/15 0001    High Level Balance   High Level Balance Comments TUG 13.5, 12.5, 10.9 with cane                      [facility] Adult PT Treatment/Exercise - 08/05/15 0001    Knee/Hip Exercises: Supine   Bridges Both;1 set;10 reps   Ankle Exercises: Standing   Rocker Board Limitations x20 lateral, U HHA   unable to perform AP due to back/other complications    Ankle Exercises: Seated   Other Seated Ankle Exercises sidelying inversion/eversion L ankle with 2 second holds 1x10             Balance Exercises - 08/05/15 1643    Balance Exercises: Standing   Standing Eyes Opened Narrow base of support (BOS);Foam/compliant surface;3 reps;20 secs   Tandem Stance Eyes open;Foam/compliant surface;3 reps;15 secs           PT Education - 08/05/15 1644    Education provided Yes   Education Details reviewed initial eval and goals    Person(s) Educated Patient   Methods Explanation   Comprehension Verbalized understanding  PT Short Term Goals - 08/03/15 1800    PT SHORT TERM GOAL #1   Title Patient to be able to perform TUG balance test in 12 seconds or less with cane in order to demonstrate reduced fall risk and improved overall mobility    Time 3   Period Weeks   Status New   PT SHORT TERM GOAL #2   Title Patient will demosntrate improved overall gait mechanics with improved ability to bear weight equally, improved posture during gait, improved gait speed, and improved heel-toe pattern in order to promote improved efficiency with mobiltiy    Time 3   Period Weeks   Status New   PT SHORT TERM GOAL #3   Title Patient will be able to verbally describe the importance of proper foot wear and bracing/orthotics in order to enhance patient's self-efficacy and knowledge in managing condition    Time 3   Period Weeks   Status  New   PT SHORT TERM GOAL #4   Title Patient will be independent in correctly and consistently perform appropraite HEP, to be updated PRN    Time 3   Period Weeks   Status New           PT Long Term Goals - 08/03/15 1806    PT LONG TERM GOAL #1   Title Patient will demonstrate 5/5 strength in all tested muscles in order to reduce chance of collapse and improve overall mobility    Time 6   Period Weeks   Status New   PT LONG TERM GOAL #2   Title Patient will be able to tolerate performing 6 minute walk test, with cane and unlimited rest breaks, in order to demonstrate improved overall endurance and mobility    Time 6   Period Weeks   Status New   PT LONG TERM GOAL #3   Title Patient to be able to tolerate shopping in grocery store with cane, no ankle collapse, and minimal unsteadiness in order to assist in improving her overall function and community participation    Time 6   Period Weeks   Status New   PT LONG TERM GOAL #4   Title Patient will report no collapse of L ankle within the past 3 weeks in order to reduce overall fall risk, potential risk of damage to tissues, and to improve overall mobility    Time 6   Period Weeks   Status New               Plan - 08/05/15 1715    Clinical Impression Statement Intorduced functional ankle strengthening with red TB and standing activity in form of rockerboard and blaance tasks, and supine exericses to assist in strengthening entire kinetic chain today. Patient had difficulty with session due to aggravation of chronic back pain despite  somewhat conservative approach  with exercises. Reported significant ankle fatigeu at end of session today.     Pt will benefit from skilled therapeutic intervention in order to improve on the following deficits Abnormal gait;Decreased endurance;Hypomobility;Decreased activity tolerance;Decreased strength;Pain;Difficulty walking;Decreased mobility;Decreased balance;Improper body mechanics;Decreased  coordination;Impaired flexibility;Postural dysfunction   Rehab Potential Fair   Clinical Impairments Affecting Rehab Potential chronicity of problem; multiple medical and body structural factors (R foot/ankle, scoliosis, profound weakness, limited ability to participate in activity) contributing to issue    PT Frequency 2x / week   PT Duration 6 weeks   PT Treatment/Interventions ADLs/Self Care Home Management;Gait training;Functional mobility training;Therapeutic activities;Therapeutic exercise;Balance training;Neuromuscular re-education;Patient/family education;Energy conservation;Taping  PT Next Visit Plan Direct manual to L foot/ankle not recommended due to inherent laxity. Focus on strength, balance, gait. Conservative approach with this patient in terms of types of activities due to easy aggravation of back pain.    PT Home Exercise Plan given    Consulted and Agree with Plan of Care Patient        Problem List Patient Active Problem List   Diagnosis Date Noted  . Osteopenia 02/16/2014  . Hyperlipidemia 04/22/2013  . Hypothyroidism 04/22/2013  . GERD (gastroesophageal reflux disease) 01/01/2013  . Rheumatoid arthritis ([place]) 12/20/2012  . Essential hypertension, benign 12/20/2012  . Chronic pain syndrome 12/20/2012    Deniece Ree PT, DPT [facility] [address] [place], [place], [address] Phone: [phone]   Fax:  [phone]  Name: Sabrina Adams MRN: [id number] Date of Birth: 1946-06-16

## 2015-08-06 ENCOUNTER — Ambulatory Visit (HOSPITAL_COMMUNITY): Payer: Medicare Other | Admitting: Physical Therapy

## 2015-08-06 DIAGNOSIS — R29898 Other symptoms and signs involving the musculoskeletal system: Secondary | ICD-10-CM

## 2015-08-06 DIAGNOSIS — M76829 Posterior tibial tendinitis, unspecified leg: Secondary | ICD-10-CM

## 2015-08-06 DIAGNOSIS — M25572 Pain in left ankle and joints of left foot: Secondary | ICD-10-CM | POA: Diagnosis not present

## 2015-08-06 DIAGNOSIS — M259 Joint disorder, unspecified: Secondary | ICD-10-CM | POA: Diagnosis not present

## 2015-08-06 DIAGNOSIS — M629 Disorder of muscle, unspecified: Secondary | ICD-10-CM | POA: Diagnosis not present

## 2015-08-06 DIAGNOSIS — R2681 Unsteadiness on feet: Secondary | ICD-10-CM | POA: Diagnosis not present

## 2015-08-06 DIAGNOSIS — R262 Difficulty in walking, not elsewhere classified: Secondary | ICD-10-CM

## 2015-08-06 NOTE — Therapy (Signed)
[place], [place], [address] Phone: [phone]   Fax:  [phone]  Physical Therapy Treatment  Patient Details  Name: Sabrina Adams MRN: [id number] Date of Birth: 1946-01-10 Referring Provider: Mechele Claude [facility]   Encounter Date: 08/06/2015      PT End of Session - 08/06/15 1655    Visit Number 3   Number of Visits 12   Date for PT Re-Evaluation 08/31/15   Authorization Type Medicare/AARP UHC supplemental plan f    Authorization Time Period 08/03/15 to 10/04/15   Authorization - Visit Number 3   Authorization - Number of Visits 10   PT Start Time 1601   PT Stop Time 1640   PT Time Calculation (min) 39 min   Activity Tolerance Patient tolerated treatment well;Patient limited by pain   Behavior During Therapy [facility] for tasks assessed/performed      Past Medical History  Diagnosis Date  . Complication of anesthesia     nausea ,  psycotics episodes  . Hypertension   . Hypothyroidism   . Sleep apnea   . GERD (gastroesophageal reflux disease)   . Neuromuscular disorder (HCC)     neuropathy  legs  . Arthritis     rheumatoid  . Anemia   . Stroke (HCC)     mild  . RA (rheumatoid arthritis) [facility])     Past Surgical History  Procedure Laterality Date  . Lumbar spine surgery  2005  . Abdominal hysterectomy  2005  . Colon surgery      colostomy then revision  . Hand arthroplasty    . Ankle surgery    . Colonoscopy  04/04/2012    Procedure: COLONOSCOPY;  Surgeon: Rogene Houston, MD;  [facility];  Service: Endoscopy;  Laterality: N/A;  410-Ok per [place]  . Esophagogastroduodenoscopy  04/04/2012    Procedure: ESOPHAGOGASTRODUODENOSCOPY (EGD);  Surgeon: Rogene Houston, MD;  Location[facility];  Service: Endoscopy;  Laterality: N/A;  possible  . Abdominal surgery      2006 colectomy for diverticulitis    There were no vitals filed for this visit.  Visit Diagnosis:  Posterior  tibialis muscle dysfunction  Left ankle pain  Ankle weakness  Difficulty walking  Unsteadiness      Subjective Assessment - 08/06/15 1603    Subjective Patient arrives reporting she is feeling good today, but did have to take some medicine at home after last session due to back pain    Pertinent History Has a history of extensive R ankle surgery that was botched initially, second surgery did not correct problem- R leg is shorter due to this structurally. Patient reports that her L foot, which is what she has been referred for, usually feels fine but will occasionally pop and collapse on her, seems to happen more when she is outdoors.  Has an appontment for orthotics for L ankle scheduled.    Currently in Pain? No/denies                         [facility] Adult PT Treatment/Exercise - 08/06/15 0001    Knee/Hip Exercises: Standing   Gait Training gait 220ft, 163ft with cane, cues for heel-toe and posture    Knee/Hip Exercises: Seated   Long Arc Quad Left;1 set;10 reps   Long Arc Quad Weight 2 lbs.   Knee/Hip Flexion 1x8 with 0# and 2# weight, both of which aggravated back pain  Ankle Exercises: Seated   Ankle Circles/Pumps Other (comment);15 reps  2 second holds each way    Other Seated Ankle Exercises isometric 4 way ankle; red therband 4 way ankle ; seated rocker board AP with L ankle only   Other Seated Ankle Exercises seated inversion/eversion with 2 second holds 1x15    Ankle Exercises: Standing   BAPS Level 1;Standing;10 reps;Other (comment)  CW and CCW    Rocker Board Limitations --  x20 lateral 2 minutes              Balance Exercises - 08/06/15 1652    Balance Exercises: Standing   Standing Eyes Opened Narrow base of support (BOS);Foam/compliant surface;3 reps;20 secs   Tandem Stance Eyes open;Foam/compliant surface;3 reps;15 secs           PT Education - 08/06/15 1654    Education provided No          PT Short Term Goals - 08/03/15  1800    PT SHORT TERM GOAL #1   Title Patient to be able to perform TUG balance test in 12 seconds or less with cane in order to demonstrate reduced fall risk and improved overall mobility    Time 3   Period Weeks   Status New   PT SHORT TERM GOAL #2   Title Patient will demosntrate improved overall gait mechanics with improved ability to bear weight equally, improved posture during gait, improved gait speed, and improved heel-toe pattern in order to promote improved efficiency with mobiltiy    Time 3   Period Weeks   Status New   PT SHORT TERM GOAL #3   Title Patient will be able to verbally describe the importance of proper foot wear and bracing/orthotics in order to enhance patient's self-efficacy and knowledge in managing condition    Time 3   Period Weeks   Status New   PT SHORT TERM GOAL #4   Title Patient will be independent in correctly and consistently perform appropraite HEP, to be updated PRN    Time 3   Period Weeks   Status New           PT Long Term Goals - 08/03/15 1806    PT LONG TERM GOAL #1   Title Patient will demonstrate 5/5 strength in all tested muscles in order to reduce chance of collapse and improve overall mobility    Time 6   Period Weeks   Status New   PT LONG TERM GOAL #2   Title Patient will be able to tolerate performing 6 minute walk test, with cane and unlimited rest breaks, in order to demonstrate improved overall endurance and mobility    Time 6   Period Weeks   Status New   PT LONG TERM GOAL #3   Title Patient to be able to tolerate shopping in grocery store with cane, no ankle collapse, and minimal unsteadiness in order to assist in improving her overall function and community participation    Time 6   Period Weeks   Status New   PT LONG TERM GOAL #4   Title Patient will report no collapse of L ankle within the past 3 weeks in order to reduce overall fall risk, potential risk of damage to tissues, and to improve overall mobility     Time 6   Period Weeks   Status New               Plan - 08/06/15 1656  Clinical Impression Statement Adjusted today's session as last session did cause patient quite a bit of back pain. Increased focus on different approraches to strengthening for ankle specifically with careful monitoring of patient tolerance of exercises/activities due to easy aggravation of pain and multiple conditions. patient did appear to tolerate  today's approach much better with only aggravation of pain coming from hip flexion at edge of mat table today. Also some fatigue from gait.    Pt will benefit from skilled therapeutic intervention in order to improve on the following deficits Abnormal gait;Decreased endurance;Hypomobility;Decreased activity tolerance;Decreased strength;Pain;Difficulty walking;Decreased mobility;Decreased balance;Improper body mechanics;Decreased coordination;Impaired flexibility;Postural dysfunction   Rehab Potential Fair   Clinical Impairments Affecting Rehab Potential chronicity of problem; multiple medical and body structural factors (R foot/ankle, scoliosis, profound weakness, limited ability to participate in activity) contributing to issue    PT Frequency 2x / week   PT Duration 6 weeks   PT Treatment/Interventions ADLs/Self Care Home Management;Gait training;Functional mobility training;Therapeutic activities;Therapeutic exercise;Balance training;Neuromuscular re-education;Patient/family education;Energy conservation;Taping   PT Next Visit Plan Direct manual to L foot/ankle not recommended due to inherent laxity. Focus on strength, balance, gait. Conservative approach with this patient in terms of types of activities due to easy aggravation of back pain.    PT Home Exercise Plan given    Consulted and Agree with Plan of Care Patient        Problem List Patient Active Problem List   Diagnosis Date Noted  . Osteopenia 02/16/2014  . Hyperlipidemia 04/22/2013  . Hypothyroidism  04/22/2013  . GERD (gastroesophageal reflux disease) 01/01/2013  . Rheumatoid arthritis ([place]) 12/20/2012  . Essential hypertension, benign 12/20/2012  . Chronic pain syndrome 12/20/2012    Deniece Ree PT, DPT [place] [address] [place], [place], [address] Phone: [phone]   Fax:  [phone]  Name: Sabrina Adams MRN: [id number] Date of Birth: Oct 07, 1945

## 2015-08-10 ENCOUNTER — Ambulatory Visit (HOSPITAL_COMMUNITY): Payer: Medicare Other

## 2015-08-13 ENCOUNTER — Ambulatory Visit (HOSPITAL_COMMUNITY): Payer: Medicare Other | Attending: Student

## 2015-08-13 DIAGNOSIS — M25572 Pain in left ankle and joints of left foot: Secondary | ICD-10-CM | POA: Insufficient documentation

## 2015-08-13 DIAGNOSIS — R262 Difficulty in walking, not elsewhere classified: Secondary | ICD-10-CM | POA: Diagnosis not present

## 2015-08-13 DIAGNOSIS — M259 Joint disorder, unspecified: Secondary | ICD-10-CM | POA: Diagnosis not present

## 2015-08-13 DIAGNOSIS — R2681 Unsteadiness on feet: Secondary | ICD-10-CM | POA: Diagnosis not present

## 2015-08-13 DIAGNOSIS — M76829 Posterior tibial tendinitis, unspecified leg: Secondary | ICD-10-CM

## 2015-08-13 DIAGNOSIS — M629 Disorder of muscle, unspecified: Secondary | ICD-10-CM | POA: Diagnosis not present

## 2015-08-13 DIAGNOSIS — R29898 Other symptoms and signs involving the musculoskeletal system: Secondary | ICD-10-CM

## 2015-08-13 NOTE — Therapy (Signed)
[place], [place], [address] Phone: [phone]   Fax:  [phone]  Physical Therapy Treatment  Patient Details  Name: Sabrina Adams MRN: [id number] Date of Birth: [birth year] Referring Provider: Mechele Claude [facility]   Encounter Date: 08/13/2015      PT End of Session - 08/13/15 1401    Visit Number 4   Number of Visits 12   Date for PT Re-Evaluation 08/31/15   Authorization Type Medicare/AARP UHC supplemental plan f    Authorization Time Period 08/03/15 to 10/04/15   Authorization - Visit Number 4   Authorization - Number of Visits 10   PT Start Time [id number]   PT Stop Time 1432   PT Time Calculation (min) 39 min   Activity Tolerance Patient tolerated treatment well;Patient limited by pain   Behavior During Therapy [facility] for tasks assessed/performed      Past Medical History  Diagnosis Date  . Complication of anesthesia     nausea ,  psycotics episodes  . Hypertension   . Hypothyroidism   . Sleep apnea   . GERD (gastroesophageal reflux disease)   . Neuromuscular disorder (HCC)     neuropathy  legs  . Arthritis     rheumatoid  . Anemia   . Stroke (HCC)     mild  . RA (rheumatoid arthritis) [facility])     Past Surgical History  Procedure Laterality Date  . Lumbar spine surgery  2005  . Abdominal hysterectomy  2005  . Colon surgery      colostomy then revision  . Hand arthroplasty    . Ankle surgery    . Colonoscopy  04/04/2012    Procedure: COLONOSCOPY;  Surgeon: Rogene Houston, MD;  [facility];  Service: Endoscopy;  Laterality: N/A;  410-Ok per [place]  . Esophagogastroduodenoscopy  04/04/2012    Procedure: ESOPHAGOGASTRODUODENOSCOPY (EGD);  Surgeon: Rogene Houston, MD;  [facility];  Service: Endoscopy;  Laterality: N/A;  possible  . Abdominal surgery      2006 colectomy for diverticulitis    There were no vitals filed for this visit.  Visit Diagnosis:  Posterior tibialis  muscle dysfunction  Left ankle pain  Ankle weakness  Difficulty walking  Unsteadiness      Subjective Assessment - 08/13/15 1358    Subjective Pt stated constant lower back pain scale 4/10.  Pt stated standing continues to be difficult due to back pain.   Pertinent History Has a history of extensive R ankle surgery that was botched initially, second surgery did not correct problem- R leg is shorter due to this structurally. Patient reports that her L foot, which is what she has been referred for, usually feels fine but will occasionally pop and collapse on her, seems to happen more when she is outdoors.  Has an appontment for orthotics for L ankle scheduled.    Currently in Pain? Yes   Pain Score 4    Pain Location Back   Pain Descriptors / Indicators Constant  R.A., contast back pain            [facility] Adult PT Treatment/Exercise - 08/13/15 0001    Ankle Exercises: Seated   Heel Raises 20 reps  Lt ankle   Toe Raise 20 reps  Lt ankle   BAPS Sitting;Level 3;10 reps  all directions   Ankle Exercises: Standing   Rocker Board Limitations 2 min L/R  Balance Exercises - 08/13/15 1423    Balance Exercises: Standing   Tandem Stance Eyes open;3 reps;30 secs  c/o lightheaded wtih 3rd redp, given water, laid down and BP             PT Short Term Goals - 08/03/15 1800    PT SHORT TERM GOAL #1   Title Patient to be able to perform TUG balance test in 12 seconds or less with cane in order to demonstrate reduced fall risk and improved overall mobility    Time 3   Period Weeks   Status New   PT SHORT TERM GOAL #2   Title Patient will demosntrate improved overall gait mechanics with improved ability to bear weight equally, improved posture during gait, improved gait speed, and improved heel-toe pattern in order to promote improved efficiency with mobiltiy    Time 3   Period Weeks   Status New   PT SHORT TERM GOAL #3   Title Patient will be able to verbally  describe the importance of proper foot wear and bracing/orthotics in order to enhance patient's self-efficacy and knowledge in managing condition    Time 3   Period Weeks   Status New   PT SHORT TERM GOAL #4   Title Patient will be independent in correctly and consistently perform appropraite HEP, to be updated PRN    Time 3   Period Weeks   Status New           PT Long Term Goals - 08/03/15 1806    PT LONG TERM GOAL #1   Title Patient will demonstrate 5/5 strength in all tested muscles in order to reduce chance of collapse and improve overall mobility    Time 6   Period Weeks   Status New   PT LONG TERM GOAL #2   Title Patient will be able to tolerate performing 6 minute walk test, with cane and unlimited rest breaks, in order to demonstrate improved overall endurance and mobility    Time 6   Period Weeks   Status New   PT LONG TERM GOAL #3   Title Patient to be able to tolerate shopping in grocery store with cane, no ankle collapse, and minimal unsteadiness in order to assist in improving her overall function and community participation    Time 6   Period Weeks   Status New   PT LONG TERM GOAL #4   Title Patient will report no collapse of L ankle within the past 3 weeks in order to reduce overall fall risk, potential risk of damage to tissues, and to improve overall mobility    Time 6   Period Weeks   Status New               Plan - 08/13/15 1423    Clinical Impression Statement Session focus on ankle strengthening and improving activity tolerance with standing to improve balance.  Pt continues to be limited by back pain requiring rest breaks through session.  During 3rd set of tandem stance pt c/io light headedness.  Pt laid down, given water and ice pack on neck, BP 116/56 with HR 68.  Session ended due to lightheadedness.  No reports of ankle pain through session.     PT Next Visit Plan Continue PT POC with focus on strength, balance and gait.  Hold manual due to  laxity, not appropraite.  Conservative approach with pt in terms of types of activities due to easy aggravation of back pain.  Problem List Patient Active Problem List   Diagnosis Date Noted  . Osteopenia 02/16/2014  . Hyperlipidemia 04/22/2013  . Hypothyroidism 04/22/2013  . GERD (gastroesophageal reflux disease) 01/01/2013  . Rheumatoid arthritis ([place]) 12/20/2012  . Essential hypertension, benign 12/20/2012  . Chronic pain syndrome 12/20/2012   Ihor [place], [place]; [place]  Aldona Lento 08/13/2015, 2:29 PM  [place] [address] [place], [place], [address] Phone: [phone]   Fax:  [phone]  Name: Sabrina Adams MRN: [id number] Date of Birth: 02-09-1946

## 2015-08-16 ENCOUNTER — Encounter (HOSPITAL_COMMUNITY): Payer: Medicare Other | Admitting: Physical Therapy

## 2015-08-18 ENCOUNTER — Ambulatory Visit (HOSPITAL_COMMUNITY): Payer: Medicare Other | Admitting: Physical Therapy

## 2015-08-18 DIAGNOSIS — R29898 Other symptoms and signs involving the musculoskeletal system: Secondary | ICD-10-CM

## 2015-08-18 DIAGNOSIS — M25572 Pain in left ankle and joints of left foot: Secondary | ICD-10-CM | POA: Diagnosis not present

## 2015-08-18 DIAGNOSIS — R2681 Unsteadiness on feet: Secondary | ICD-10-CM

## 2015-08-18 DIAGNOSIS — M259 Joint disorder, unspecified: Secondary | ICD-10-CM | POA: Diagnosis not present

## 2015-08-18 DIAGNOSIS — M629 Disorder of muscle, unspecified: Secondary | ICD-10-CM | POA: Diagnosis not present

## 2015-08-18 DIAGNOSIS — R262 Difficulty in walking, not elsewhere classified: Secondary | ICD-10-CM

## 2015-08-18 DIAGNOSIS — M76829 Posterior tibial tendinitis, unspecified leg: Secondary | ICD-10-CM

## 2015-08-18 NOTE — Patient Instructions (Signed)
PEN AND COIN  * Sit in chair with your left foot relaxed on floor * Place a quarter under the ball of your foot * Place pen underneath the arch of your foot * Attempt to pull the arch of your foot off the pen without lifting your foot off of the coin * Repeat for 30-60 sec and complete 2 consecutive trials, 2x/day

## 2015-08-18 NOTE — Therapy (Signed)
[place], [place], [address] Phone: [phone]   Fax:  [phone]  Physical Therapy Treatment  Patient Details  Name: Sabrina Adams MRN: [id number] Date of Birth: 12/27/1945 Referring Provider: Mechele Claude [facility]   Encounter Date: 08/18/2015      PT End of Session - 08/18/15 1641    Visit Number 5   Number of Visits 12   Date for PT Re-Evaluation 08/31/15   Authorization Type Medicare/AARP UHC supplemental plan f    Authorization Time Period 08/03/15 to 10/04/15   Authorization - Visit Number 5   Authorization - Number of Visits 10   PT Start Time 1301   PT Stop Time 1345   PT Time Calculation (min) 44 min   Activity Tolerance Patient tolerated treatment well;Patient limited by pain   Behavior During Therapy [facility] for tasks assessed/performed      Past Medical History  Diagnosis Date  . Complication of anesthesia     nausea ,  psycotics episodes  . Hypertension   . Hypothyroidism   . Sleep apnea   . GERD (gastroesophageal reflux disease)   . Neuromuscular disorder (HCC)     neuropathy  legs  . Arthritis     rheumatoid  . Anemia   . Stroke (HCC)     mild  . RA (rheumatoid arthritis) [facility])     Past Surgical History  Procedure Laterality Date  . Lumbar spine surgery  2005  . Abdominal hysterectomy  2005  . Colon surgery      colostomy then revision  . Hand arthroplasty    . Ankle surgery    . Colonoscopy  04/04/2012    Procedure: COLONOSCOPY;  Surgeon: Rogene Houston, MD;  [facility];  Service: Endoscopy;  Laterality: N/A;  410-Ok per [place]  . Esophagogastroduodenoscopy  04/04/2012    Procedure: ESOPHAGOGASTRODUODENOSCOPY (EGD);  Surgeon: Rogene Houston, MD;  [facility];  Service: Endoscopy;  Laterality: N/A;  possible  . Abdominal surgery      2006 colectomy for diverticulitis    There were no vitals filed for this visit.  Visit Diagnosis:  Posterior tibialis  muscle dysfunction  Left ankle pain  Ankle weakness  Difficulty walking  Unsteadiness      Subjective Assessment - 08/18/15 1304    Subjective Patient reports that she is doing well today, hasn't been out until today due to snow and has not had any other light headedness since last PT appointemnt    Pertinent History Has a history of extensive R ankle surgery that was botched initially, second surgery did not correct problem- R leg is shorter due to this structurally. Patient reports that her L foot, which is what she has been referred for, usually feels fine but will occasionally pop and collapse on her, seems to happen more when she is outdoors.  Has an appontment for orthotics for L ankle scheduled.    Patient Stated Goals strengthen ankle    Currently in Pain? Yes   Pain Score 4    Pain Location Back                         [facility] Adult PT Treatment/Exercise - 08/18/15 0001    Knee/Hip Exercises: Standing   Gait Training gait 231ft focus on heel toe and form    Ankle Exercises: Seated   Ankle Circles/Pumps Left;15 reps;Other (comment)  3 second holds  Other Seated Ankle Exercises isometric 4 way ankle; red therband 4 way ankle ; seated rocker board AP with L ankle only   Other Seated Ankle Exercises seated inversion/eversion with 3 second holds 1x15; pen-coin exercise for arch              Balance Exercises - 08/18/15 1325    Balance Exercises: Standing   Standing Eyes Opened Narrow base of support (BOS);Foam/compliant surface;4 reps;10 secs  with min assist to maintain balance   Tandem Stance Eyes open;3 reps;15 secs  with min assist to maintain balance   Cone Rotation Limitations attempted cone rotations however patient unable to perform due to back pain            PT Education - 08/18/15 1641    Education provided Yes   Education Details pen coin HEP exercise    Person(s) Educated Patient   Methods Explanation   Comprehension Verbalized  understanding          PT Short Term Goals - 08/03/15 1800    PT SHORT TERM GOAL #1   Title Patient to be able to perform TUG balance test in 12 seconds or less with cane in order to demonstrate reduced fall risk and improved overall mobility    Time 3   Period Weeks   Status New   PT SHORT TERM GOAL #2   Title Patient will demosntrate improved overall gait mechanics with improved ability to bear weight equally, improved posture during gait, improved gait speed, and improved heel-toe pattern in order to promote improved efficiency with mobiltiy    Time 3   Period Weeks   Status New   PT SHORT TERM GOAL #3   Title Patient will be able to verbally describe the importance of proper foot wear and bracing/orthotics in order to enhance patient's self-efficacy and knowledge in managing condition    Time 3   Period Weeks   Status New   PT SHORT TERM GOAL #4   Title Patient will be independent in correctly and consistently perform appropraite HEP, to be updated PRN    Time 3   Period Weeks   Status New           PT Long Term Goals - 08/03/15 1806    PT LONG TERM GOAL #1   Title Patient will demonstrate 5/5 strength in all tested muscles in order to reduce chance of collapse and improve overall mobility    Time 6   Period Weeks   Status New   PT LONG TERM GOAL #2   Title Patient will be able to tolerate performing 6 minute walk test, with cane and unlimited rest breaks, in order to demonstrate improved overall endurance and mobility    Time 6   Period Weeks   Status New   PT LONG TERM GOAL #3   Title Patient to be able to tolerate shopping in grocery store with cane, no ankle collapse, and minimal unsteadiness in order to assist in improving her overall function and community participation    Time 6   Period Weeks   Status New   PT LONG TERM GOAL #4   Title Patient will report no collapse of L ankle within the past 3 weeks in order to reduce overall fall risk, potential risk  of damage to tissues, and to improve overall mobility    Time 6   Period Weeks   Status New  Plan - 08/18/15 1641    Clinical Impression Statement Continued to focus on ankle strengthening and stabilty as well as balance; patient continuse to be significantly limited during functional exercises by back pain and pain in her other ankle, which can make identifying appropriate exercises challenging at times. No light headedness at this time throughtout session. Introduced pen coin exercise which patient appeared to tolerate well and added to HEP.    Pt will benefit from skilled therapeutic intervention in order to improve on the following deficits Abnormal gait;Decreased endurance;Hypomobility;Decreased activity tolerance;Decreased strength;Pain;Difficulty walking;Decreased mobility;Decreased balance;Improper body mechanics;Decreased coordination;Impaired flexibility;Postural dysfunction   Rehab Potential Fair   Clinical Impairments Affecting Rehab Potential chronicity of problem; multiple medical and body structural factors (R foot/ankle, scoliosis, profound weakness, limited ability to participate in activity) contributing to issue    PT Frequency 2x / week   PT Duration 6 weeks   PT Treatment/Interventions ADLs/Self Care Home Management;Gait training;Functional mobility training;Therapeutic activities;Therapeutic exercise;Balance training;Neuromuscular re-education;Patient/family education;Energy conservation;Taping   PT Next Visit Plan Continue PT POC with focus on strength, balance and gait.  Hold manual due to laxity, not appropraite.  Conservative approach with pt in terms of types of activities due to easy aggravation of back pain.     PT Home Exercise Plan given    Consulted and Agree with Plan of Care Patient        Problem List Patient Active Problem List   Diagnosis Date Noted  . Osteopenia 02/16/2014  . Hyperlipidemia 04/22/2013  . Hypothyroidism 04/22/2013   . GERD (gastroesophageal reflux disease) 01/01/2013  . Rheumatoid arthritis ([place]) 12/20/2012  . Essential hypertension, benign 12/20/2012  . Chronic pain syndrome 12/20/2012     Deniece Ree PT, DPT [place] [address] [place], [place], [address] Phone: [phone]   Fax:  [phone]  Name: Sabrina Adams MRN: [id number] Date of Birth: [birth year]

## 2015-08-20 ENCOUNTER — Ambulatory Visit (HOSPITAL_COMMUNITY): Payer: Medicare Other

## 2015-08-20 DIAGNOSIS — M259 Joint disorder, unspecified: Secondary | ICD-10-CM | POA: Diagnosis not present

## 2015-08-20 DIAGNOSIS — R262 Difficulty in walking, not elsewhere classified: Secondary | ICD-10-CM

## 2015-08-20 DIAGNOSIS — M629 Disorder of muscle, unspecified: Secondary | ICD-10-CM | POA: Diagnosis not present

## 2015-08-20 DIAGNOSIS — R2681 Unsteadiness on feet: Secondary | ICD-10-CM | POA: Diagnosis not present

## 2015-08-20 DIAGNOSIS — M76829 Posterior tibial tendinitis, unspecified leg: Secondary | ICD-10-CM

## 2015-08-20 DIAGNOSIS — M25572 Pain in left ankle and joints of left foot: Secondary | ICD-10-CM

## 2015-08-20 DIAGNOSIS — R29898 Other symptoms and signs involving the musculoskeletal system: Secondary | ICD-10-CM

## 2015-08-20 NOTE — Therapy (Signed)
[place], [place], [address] Phone: [phone]   Fax:  [phone]  Physical Therapy Treatment  Patient Details  Name: Sabrina Adams MRN: [id number] Date of Birth: 05/14/1946 Referring Provider: Mechele Claude [facility]   Encounter Date: 08/20/2015      PT End of Session - 08/20/15 1306    Visit Number 6   Number of Visits 12   Date for PT Re-Evaluation 08/31/15   Authorization Type Medicare/[employer] UHC supplemental plan f    Authorization Time Period 08/03/15 to 10/04/15   Authorization - Visit Number 6   Authorization - Number of Visits 10   PT Start Time [id number]   PT Stop Time 1340   PT Time Calculation (min) 44 min   Activity Tolerance Patient tolerated treatment well   Behavior During Therapy [facility] for tasks assessed/performed      Past Medical History  Diagnosis Date  . Complication of anesthesia     nausea ,  psycotics episodes  . Hypertension   . Hypothyroidism   . Sleep apnea   . GERD (gastroesophageal reflux disease)   . Neuromuscular disorder (HCC)     neuropathy  legs  . Arthritis     rheumatoid  . Anemia   . Stroke (HCC)     mild  . RA (rheumatoid arthritis) [facility])     Past Surgical History  Procedure Laterality Date  . Lumbar spine surgery  2005  . Abdominal hysterectomy  2005  . Colon surgery      colostomy then revision  . Hand arthroplasty    . Ankle surgery    . Colonoscopy  04/04/2012    Procedure: COLONOSCOPY;  Surgeon: Rogene Houston, MD;  [facility];  Service: Endoscopy;  Laterality: N/A;  410-Ok per [place]  . Esophagogastroduodenoscopy  04/04/2012    Procedure: ESOPHAGOGASTRODUODENOSCOPY (EGD);  Surgeon: Rogene Houston, MD;  [facility];  Service: Endoscopy;  Laterality: N/A;  possible  . Abdominal surgery      2006 colectomy for diverticulitis    There were no vitals filed for this visit.  Visit Diagnosis:  Posterior tibialis muscle  dysfunction  Left ankle pain  Ankle weakness  Difficulty walking  Unsteadiness      Subjective Assessment - 08/20/15 1253    Subjective Pt stated she is doing okay today, outside of a little arthritic pain.  No reports of lightheadedness since last apt.     Currently in Pain? Yes   Pain Score 4    Pain Location Leg   Pain Orientation Left   Pain Descriptors / Indicators Aching             [facility] Adult PT Treatment/Exercise - 08/20/15 0001    Knee/Hip Exercises: Standing   Hip Abduction Left;10 reps;Right;5 reps   Knee/Hip Exercises: Supine   Bridges 10 reps   Knee/Hip Exercises: Sidelying   Hip ABduction Both;10 reps   Ankle Exercises: Standing   Rocker Board Limitations 2 min L/R   Ankle Exercises: Seated   Towel Inversion/Eversion 5 reps   Heel Raises 20 reps   Toe Raise 20 reps   Other Seated Ankle Exercises RTB all directions, coin/pen arching exercise; toe extension with holding arch 10x10"             Balance Exercises - 08/20/15 1307    Balance Exercises: Standing   Standing Eyes Opened Narrow base of support (BOS);Foam/compliant surface;2 reps;30 secs   Tandem  Stance Eyes open;Foam/compliant surface;2 reps;30 secs             PT Short Term Goals - 08/03/15 1800    PT SHORT TERM GOAL #1   Title Patient to be able to perform TUG balance test in 12 seconds or less with cane in order to demonstrate reduced fall risk and improved overall mobility    Time 3   Period Weeks   Status New   PT SHORT TERM GOAL #2   Title Patient will demosntrate improved overall gait mechanics with improved ability to bear weight equally, improved posture during gait, improved gait speed, and improved heel-toe pattern in order to promote improved efficiency with mobiltiy    Time 3   Period Weeks   Status New   PT SHORT TERM GOAL #3   Title Patient will be able to verbally describe the importance of proper foot wear and bracing/orthotics in order to enhance patient's  self-efficacy and knowledge in managing condition    Time 3   Period Weeks   Status New   PT SHORT TERM GOAL #4   Title Patient will be independent in correctly and consistently perform appropraite HEP, to be updated PRN    Time 3   Period Weeks   Status New           PT Long Term Goals - 08/03/15 1806    PT LONG TERM GOAL #1   Title Patient will demonstrate 5/5 strength in all tested muscles in order to reduce chance of collapse and improve overall mobility    Time 6   Period Weeks   Status New   PT LONG TERM GOAL #2   Title Patient will be able to tolerate performing 6 minute walk test, with cane and unlimited rest breaks, in order to demonstrate improved overall endurance and mobility    Time 6   Period Weeks   Status New   PT LONG TERM GOAL #3   Title Patient to be able to tolerate shopping in grocery store with cane, no ankle collapse, and minimal unsteadiness in order to assist in improving her overall function and community participation    Time 6   Period Weeks   Status New   PT LONG TERM GOAL #4   Title Patient will report no collapse of L ankle within the past 3 weeks in order to reduce overall fall risk, potential risk of damage to tissues, and to improve overall mobility    Time 6   Period Weeks   Status New               Plan - 08/20/15 1344    Clinical Impression Statement Pt improving tolerance with standing balance activities, able to hold NBOS and tandem stance 30" holds today.  Continued ankle strenghtening exercises, pt given theraband and exercise printout for HEP.  Added toe extension with arch exercises to reduce arch collapse and abduction exercises for glut med strengthening to improve balance and gait mechanics.  End of session pt limited by fatigue, no reports of increased pain through session.   PT Next Visit Plan Continue PT POC with focus on strength, balance and gait.  Hold manual due to laxity, not appropraite.  Conservative approach with  pt in terms of types of activities due to easy aggravation of back pain.          Problem List Patient Active Problem List   Diagnosis Date Noted  . Osteopenia 02/16/2014  . Hyperlipidemia  04/22/2013  . Hypothyroidism 04/22/2013  . GERD (gastroesophageal reflux disease) 01/01/2013  . Rheumatoid arthritis ([place]) 12/20/2012  . Essential hypertension, benign 12/20/2012  . Chronic pain syndrome 12/20/2012   Ihor Austin, LPTA; [place]  Aldona Lento 08/20/2015, 2:49 PM  [place] [address] [place], [place], [address] Phone: [phone]   Fax:  [phone]  Name: Sabrina Adams MRN: [id number] Date of Birth: 1945/09/07

## 2015-08-20 NOTE — Patient Instructions (Signed)
Dorsiflexion (Eccentric), (Resistance Band)    Pull foot up against resistance band. Slowly release for 3-5 seconds. Use RED resistance band. 10-20 reps per set, 1-2 sets per day  http://ecce.exer.us/1   Copyright  VHI. All rights reserved.   Plantarflexion (Eccentric), (Resistance Band)    Point foot down against resistance band. Slowly release for 3-5 seconds. Use RED resistance band. 10-20 reps per set, 1-2 sets per day.  http://ecce.exer.us/3   Copyright  VHI. All rights reserved.   Inversion (Eccentric), (Resistance Band)    Pull foot in against resistance band. Slowly release for 3-5 seconds. Use RED  resistance band. 10-20 reps per set, 1-2 sets per day.  http://ecce.exer.us/5   Copyright  VHI. All rights reserved.   Eversion (Eccentric), (Resistance Band)    Push foot outward against resistance band. Slowly release for 3-5 seconds. Use RED resistance band. 10-20 reps per set, 1-2 sets per day.  http://ecce.exer.us/7   Copyright  VHI. All rights reserved.

## 2015-08-24 ENCOUNTER — Encounter: Payer: Self-pay | Admitting: Family Medicine

## 2015-08-24 NOTE — Telephone Encounter (Signed)
Nurse's-please send prescription to [facility]

## 2015-08-24 NOTE — Telephone Encounter (Signed)
Nurse's-please provide prescription in send it to this organization thank you, I will sign the prescription after completed

## 2015-08-25 ENCOUNTER — Ambulatory Visit (HOSPITAL_COMMUNITY): Payer: Medicare Other

## 2015-08-25 DIAGNOSIS — M76829 Posterior tibial tendinitis, unspecified leg: Secondary | ICD-10-CM

## 2015-08-25 DIAGNOSIS — M25572 Pain in left ankle and joints of left foot: Secondary | ICD-10-CM

## 2015-08-25 DIAGNOSIS — M629 Disorder of muscle, unspecified: Secondary | ICD-10-CM | POA: Diagnosis not present

## 2015-08-25 DIAGNOSIS — R262 Difficulty in walking, not elsewhere classified: Secondary | ICD-10-CM

## 2015-08-25 DIAGNOSIS — R2681 Unsteadiness on feet: Secondary | ICD-10-CM | POA: Diagnosis not present

## 2015-08-25 DIAGNOSIS — M259 Joint disorder, unspecified: Secondary | ICD-10-CM | POA: Diagnosis not present

## 2015-08-25 DIAGNOSIS — R29898 Other symptoms and signs involving the musculoskeletal system: Secondary | ICD-10-CM

## 2015-08-25 NOTE — Therapy (Signed)
[place], [place], [address] Phone: [phone]   Fax:  [phone]  Physical Therapy Treatment  Patient Details  Name: Sabrina Adams MRN: [id number] Date of Birth: 1946-01-15 Referring Provider: Mechele Claude [facility]   Encounter Date: 08/25/2015      PT End of Session - 08/25/15 1306    Visit Number 7   Number of Visits 12   Date for PT Re-Evaluation 08/31/15   Authorization Type Medicare/[employer] UHC supplemental plan f    Authorization Time Period 08/03/15 to 10/04/15   Authorization - Visit Number 7   Authorization - Number of Visits 10   PT Start Time 1301   PT Stop Time 1340  Tx done at 1340, pt rested sidelying with ice on neck til 1355   PT Time Calculation (min) 39 min   Activity Tolerance Patient tolerated treatment well   Behavior During Therapy [facility] for tasks assessed/performed      Past Medical History  Diagnosis Date  . Complication of anesthesia     nausea ,  psycotics episodes  . Hypertension   . Hypothyroidism   . Sleep apnea   . GERD (gastroesophageal reflux disease)   . Neuromuscular disorder (HCC)     neuropathy  legs  . Arthritis     rheumatoid  . Anemia   . Stroke (HCC)     mild  . RA (rheumatoid arthritis) [facility])     Past Surgical History  Procedure Laterality Date  . Lumbar spine surgery  2005  . Abdominal hysterectomy  2005  . Colon surgery      colostomy then revision  . Hand arthroplasty    . Ankle surgery    . Colonoscopy  04/04/2012    Procedure: COLONOSCOPY;  Surgeon: Rogene Houston, MD;  [facility];  Service: Endoscopy;  Laterality: N/A;  410-Ok per [place]  . Esophagogastroduodenoscopy  04/04/2012    Procedure: ESOPHAGOGASTRODUODENOSCOPY (EGD);  Surgeon: Rogene Houston, MD;  [facility];  Service: Endoscopy;  Laterality: N/A;  possible  . Abdominal surgery      2006 colectomy for diverticulitis    There were no vitals filed for this  visit.  Visit Diagnosis:  Posterior tibialis muscle dysfunction  Left ankle pain  Ankle weakness  Difficulty walking  Unsteadiness      Subjective Assessment - 08/25/15 1305    Subjective Pt stated she if feeling pretty good today, pain scale 4/10 Lt thigh is sore and tingling today   Pertinent History Has a history of extensive R ankle surgery that was botched initially, second surgery did not correct problem- R leg is shorter due to this structurally. Patient reports that her L foot, which is what she has been referred for, usually feels fine but will occasionally pop and collapse on her, seems to happen more when she is outdoors.  Has an appontment for orthotics for L ankle scheduled.    Patient Stated Goals strengthen ankle    Currently in Pain? Yes   Pain Score 4    Pain Location Leg   Pain Orientation Left;Proximal;Anterior             [facility] Adult PT Treatment/Exercise - 08/25/15 0001    Knee/Hip Exercises: Standing   Hip Abduction Both;10 reps   Rocker Board 2 minutes  R/L with HHA   Knee/Hip Exercises: Seated   Sit to Sand 5 reps;without UE support   Knee/Hip Exercises: Supine  Bridges 10 reps   Knee/Hip Exercises: Sidelying   Hip ABduction Both;10 reps   Ankle Exercises: Seated   Towel Inversion/Eversion 5 reps;Weights  2#   Heel Raises 20 reps   Toe Raise 20 reps   Other Seated Ankle Exercises RTB all directions, coin/pen arching exercise; toe extension with holding arch 15x10"   Ankle Exercises: Standing   Rocker Board Limitations 2 min L/R             PT Short Term Goals - 08/03/15 1800    PT SHORT TERM GOAL #1   Title Patient to be able to perform TUG balance test in 12 seconds or less with cane in order to demonstrate reduced fall risk and improved overall mobility    Time 3   Period Weeks   Status New   PT SHORT TERM GOAL #2   Title Patient will demosntrate improved overall gait mechanics with improved ability to bear weight equally,  improved posture during gait, improved gait speed, and improved heel-toe pattern in order to promote improved efficiency with mobiltiy    Time 3   Period Weeks   Status New   PT SHORT TERM GOAL #3   Title Patient will be able to verbally describe the importance of proper foot wear and bracing/orthotics in order to enhance patient's self-efficacy and knowledge in managing condition    Time 3   Period Weeks   Status New   PT SHORT TERM GOAL #4   Title Patient will be independent in correctly and consistently perform appropraite HEP, to be updated PRN    Time 3   Period Weeks   Status New           PT Long Term Goals - 08/03/15 1806    PT LONG TERM GOAL #1   Title Patient will demonstrate 5/5 strength in all tested muscles in order to reduce chance of collapse and improve overall mobility    Time 6   Period Weeks   Status New   PT LONG TERM GOAL #2   Title Patient will be able to tolerate performing 6 minute walk test, with cane and unlimited rest breaks, in order to demonstrate improved overall endurance and mobility    Time 6   Period Weeks   Status New   PT LONG TERM GOAL #3   Title Patient to be able to tolerate shopping in grocery store with cane, no ankle collapse, and minimal unsteadiness in order to assist in improving her overall function and community participation    Time 6   Period Weeks   Status New   PT LONG TERM GOAL #4   Title Patient will report no collapse of L ankle within the past 3 weeks in order to reduce overall fall risk, potential risk of damage to tissues, and to improve overall mobility    Time 6   Period Weeks   Status New               Plan - 08/25/15 1407    Clinical Impression Statement Contiunued session focus on LE strengthening, able to add weight with inversion exercises for posterior tib strengthening as well as arch supportize exercises.  Began sit to stand for functional gluteal strengthening.  Began kinesio taping to facilitate  posterior tib and assist with arch support.  While completing seated exercises pt became dizzy and lightheaded symptoms.  BP taken manuakl at 142/78, HR at 71 bpm and O2 sat at 95%.  Discussion held  with nutrition and medication frequency, pt reports she has only had grits for breakfast today and feels the dizzy episodes related.  Pt encouraged to eat well prior therapy session as this is the second incident with lightheadedness.  No reports of pain through session   PT Next Visit Plan Continue PT POC with focus on strength, balance and gait.  Hold manual due to laxity, not appropraite.  Conservative approach with pt in terms of types of activities due to easy aggravation of back pain.  Review and discuss medication related to lightheadedness next session.        Problem List Patient Active Problem List   Diagnosis Date Noted  . Osteopenia 02/16/2014  . Hyperlipidemia 04/22/2013  . Hypothyroidism 04/22/2013  . GERD (gastroesophageal reflux disease) 01/01/2013  . Rheumatoid arthritis ([place]) 12/20/2012  . Essential hypertension, benign 12/20/2012  . Chronic pain syndrome 12/20/2012   Ihor Austin, [place]; [place]  Aldona Lento 08/25/2015, 2:22 PM  [place] [address] [place], [place], [address] Phone: [phone]   Fax:  [phone]  Name: SHAAKIRA MOHRMAN MRN: [id number] Date of Birth: 03-Jun-1946

## 2015-08-27 ENCOUNTER — Ambulatory Visit (HOSPITAL_COMMUNITY): Payer: Medicare Other

## 2015-08-27 DIAGNOSIS — M629 Disorder of muscle, unspecified: Secondary | ICD-10-CM | POA: Diagnosis not present

## 2015-08-27 DIAGNOSIS — R262 Difficulty in walking, not elsewhere classified: Secondary | ICD-10-CM

## 2015-08-27 DIAGNOSIS — M25572 Pain in left ankle and joints of left foot: Secondary | ICD-10-CM

## 2015-08-27 DIAGNOSIS — R29898 Other symptoms and signs involving the musculoskeletal system: Secondary | ICD-10-CM

## 2015-08-27 DIAGNOSIS — M259 Joint disorder, unspecified: Secondary | ICD-10-CM | POA: Diagnosis not present

## 2015-08-27 DIAGNOSIS — R2681 Unsteadiness on feet: Secondary | ICD-10-CM

## 2015-08-27 DIAGNOSIS — M76829 Posterior tibial tendinitis, unspecified leg: Secondary | ICD-10-CM

## 2015-08-27 NOTE — Therapy (Signed)
[place], [place], [address] Phone: [phone]   Fax:  [phone]  Physical Therapy Treatment  Patient Details  Name: Sabrina Adams MRN: [id number] Date of Birth: April 02, 1946 Referring Provider: Mechele Claude [facility]   Encounter Date: 08/27/2015      PT End of Session - 08/27/15 1354    Visit Number 8   Number of Visits 12   Date for PT Re-Evaluation 08/31/15   Authorization Type Medicare/AARP UHC supplemental plan f    Authorization Time Period 08/03/15 to 10/04/15   Authorization - Visit Number 8   Authorization - Number of Visits 10   PT Start Time 1350   PT Stop Time 1432   PT Time Calculation (min) 42 min   Activity Tolerance Patient tolerated treatment well   Behavior During Therapy [facility] for tasks assessed/performed      Past Medical History  Diagnosis Date  . Complication of anesthesia     nausea ,  psycotics episodes  . Hypertension   . Hypothyroidism   . Sleep apnea   . GERD (gastroesophageal reflux disease)   . Neuromuscular disorder (HCC)     neuropathy  legs  . Arthritis     rheumatoid  . Anemia   . Stroke (HCC)     mild  . RA (rheumatoid arthritis) [facility])     Past Surgical History  Procedure Laterality Date  . Lumbar spine surgery  2005  . Abdominal hysterectomy  2005  . Colon surgery      colostomy then revision  . Hand arthroplasty    . Ankle surgery    . Colonoscopy  04/04/2012    Procedure: COLONOSCOPY;  Surgeon: Rogene Houston, MD;  [facility];  Service: Endoscopy;  Laterality: N/A;  410-Ok per [place]  . Esophagogastroduodenoscopy  04/04/2012    Procedure: ESOPHAGOGASTRODUODENOSCOPY (EGD);  Surgeon: Rogene Houston, MD;  [facility];  Service: Endoscopy;  Laterality: N/A;  possible  . Abdominal surgery      2006 colectomy for diverticulitis    There were no vitals filed for this visit.  Visit Diagnosis:  Posterior tibialis muscle  dysfunction  Left ankle pain  Ankle weakness  Difficulty walking  Unsteadiness      Subjective Assessment - 08/27/15 1352    Subjective Pt stated she was tired following last session, pain scale 2/10 Lt thigh is tingling stopping at knee lateral aspect and lower back pain.     Pertinent History Has a history of extensive R ankle surgery that was botched initially, second surgery did not correct problem- R leg is shorter due to this structurally. Patient reports that her L foot, which is what she has been referred for, usually feels fine but will occasionally pop and collapse on her, seems to happen more when she is outdoors.  Has an appontment for orthotics for L ankle scheduled.    Patient Stated Goals strengthen ankle    Currently in Pain? Yes   Pain Score 2    Pain Location Leg  Back and Lt LE   Pain Orientation Left;Lateral;Proximal                         [facility] Adult PT Treatment/Exercise - 08/27/15 0001    Knee/Hip Exercises: Aerobic   Nustep 6' hill level 2 resistance 2 end of session   Knee/Hip Exercises: Standing   Hip Abduction Both;10 reps   Knee/Hip  Exercises: Seated   Sit to Sand 5 reps;without UE support   Knee/Hip Exercises: Supine   Bridges 10 reps   Ankle Exercises: Seated   Heel Raises 20 reps   Toe Raise 20 reps   BAPS Sitting;Level 3;10 reps   Ankle Exercises: Standing   Rocker Board Limitations 2 min L/R           PT Short Term Goals - 08/03/15 1800    PT SHORT TERM GOAL #1   Title Patient to be able to perform TUG balance test in 12 seconds or less with cane in order to demonstrate reduced fall risk and improved overall mobility    Time 3   Period Weeks   Status New   PT SHORT TERM GOAL #2   Title Patient will demosntrate improved overall gait mechanics with improved ability to bear weight equally, improved posture during gait, improved gait speed, and improved heel-toe pattern in order to promote improved efficiency with  mobiltiy    Time 3   Period Weeks   Status New   PT SHORT TERM GOAL #3   Title Patient will be able to verbally describe the importance of proper foot wear and bracing/orthotics in order to enhance patient's self-efficacy and knowledge in managing condition    Time 3   Period Weeks   Status New   PT SHORT TERM GOAL #4   Title Patient will be independent in correctly and consistently perform appropraite HEP, to be updated PRN    Time 3   Period Weeks   Status New           PT Long Term Goals - 08/03/15 1806    PT LONG TERM GOAL #1   Title Patient will demonstrate 5/5 strength in all tested muscles in order to reduce chance of collapse and improve overall mobility    Time 6   Period Weeks   Status New   PT LONG TERM GOAL #2   Title Patient will be able to tolerate performing 6 minute walk test, with cane and unlimited rest breaks, in order to demonstrate improved overall endurance and mobility    Time 6   Period Weeks   Status New   PT LONG TERM GOAL #3   Title Patient to be able to tolerate shopping in grocery store with cane, no ankle collapse, and minimal unsteadiness in order to assist in improving her overall function and community participation    Time 6   Period Weeks   Status New   PT LONG TERM GOAL #4   Title Patient will report no collapse of L ankle within the past 3 weeks in order to reduce overall fall risk, potential risk of damage to tissues, and to improve overall mobility    Time 6   Period Weeks   Status New               Plan - 08/27/15 1421    Clinical Impression Statement Pt continues to be limited by fatigue and pain lower back and Lt LE.  Session focus on improving LE strengthening with pain free activities.   Pt able to complete sit to stand from lowest height indication improve gluteal strength, still continues to be fatigue with abduction activities with ability to complete 10 reps max. Began Nustep for LE strengthening and to improve  activity tolerance with ability to keep step per minute around 70 SPM.  No reports of lightheadedness during session.  Discussion was held about diet  and medication to reduce risk of incident.     PT Next Visit Plan Continue PT POC with focus on strength, balance and gait.  Hold manual due to laxity, not appropraite.  Conservative approach with pt in terms of types of activities due to easy aggravation of back pain.          Problem List Patient Active Problem List   Diagnosis Date Noted  . Osteopenia 02/16/2014  . Hyperlipidemia 04/22/2013  . Hypothyroidism 04/22/2013  . GERD (gastroesophageal reflux disease) 01/01/2013  . Rheumatoid arthritis ([place]) 12/20/2012  . Essential hypertension, benign 12/20/2012  . Chronic pain syndrome 12/20/2012   Ihor Austin, LPTA; [place]  Aldona Lento 08/27/2015, 2:29 PM  [place] [address] [place], [place], [address] Phone: [phone]   Fax:  [phone]  Name: Sabrina Adams MRN: [id number] Date of Birth: [birth year]

## 2015-09-01 ENCOUNTER — Ambulatory Visit (HOSPITAL_COMMUNITY): Payer: Medicare Other | Admitting: Physical Therapy

## 2015-09-01 DIAGNOSIS — M76829 Posterior tibial tendinitis, unspecified leg: Secondary | ICD-10-CM

## 2015-09-01 DIAGNOSIS — R2681 Unsteadiness on feet: Secondary | ICD-10-CM | POA: Diagnosis not present

## 2015-09-01 DIAGNOSIS — R29898 Other symptoms and signs involving the musculoskeletal system: Secondary | ICD-10-CM

## 2015-09-01 DIAGNOSIS — R262 Difficulty in walking, not elsewhere classified: Secondary | ICD-10-CM | POA: Diagnosis not present

## 2015-09-01 DIAGNOSIS — M629 Disorder of muscle, unspecified: Secondary | ICD-10-CM | POA: Diagnosis not present

## 2015-09-01 DIAGNOSIS — M25572 Pain in left ankle and joints of left foot: Secondary | ICD-10-CM | POA: Diagnosis not present

## 2015-09-01 DIAGNOSIS — M259 Joint disorder, unspecified: Secondary | ICD-10-CM | POA: Diagnosis not present

## 2015-09-01 NOTE — Therapy (Signed)
[place], [place], [address] Phone: [phone]   Fax:  [phone]  Physical Therapy Treatment (Re-Assessment)  Patient Details  Name: Sabrina Adams MRN: [id number] Date of Birth: 1945/12/12 Referring Provider: Mechele Claude [facility]   Encounter Date: 09/01/2015      PT End of Session - 09/01/15 1357    Visit Number 9   Number of Visits 16   Date for PT Re-Evaluation 09/29/15   Authorization Type Medicare/[employer] UHC supplemental plan f    Authorization Time Period 08/03/15 to 10/04/15   Authorization - Visit Number 9   Authorization - Number of Visits 19   PT Start Time 1304   PT Stop Time 1346   PT Time Calculation (min) 42 min   Activity Tolerance Patient tolerated treatment well   Behavior During Therapy [facility] for tasks assessed/performed      Past Medical History  Diagnosis Date  . Complication of anesthesia     nausea ,  psycotics episodes  . Hypertension   . Hypothyroidism   . Sleep apnea   . GERD (gastroesophageal reflux disease)   . Neuromuscular disorder (HCC)     neuropathy  legs  . Arthritis     rheumatoid  . Anemia   . Stroke (HCC)     mild  . RA (rheumatoid arthritis) [facility])     Past Surgical History  Procedure Laterality Date  . Lumbar spine surgery  2005  . Abdominal hysterectomy  2005  . Colon surgery      colostomy then revision  . Hand arthroplasty    . Ankle surgery    . Colonoscopy  04/04/2012    Procedure: COLONOSCOPY;  Surgeon: Rogene Houston, MD;  [facility];  Service: Endoscopy;  Laterality: N/A;  410-Ok per [place]  . Esophagogastroduodenoscopy  04/04/2012    Procedure: ESOPHAGOGASTRODUODENOSCOPY (EGD);  Surgeon: Rogene Houston, MD;  [facility];  Service: Endoscopy;  Laterality: N/A;  possible  . Abdominal surgery      2006 colectomy for diverticulitis    There were no vitals filed for this visit.  Visit Diagnosis:  Posterior tibialis muscle  dysfunction  Left ankle pain  Ankle weakness  Difficulty walking  Unsteadiness      Subjective Assessment - 09/01/15 1306    Subjective Patient reports that she has not had any collapses of her foot/ankle since starting PT; reports she does feel the kinesiotaping helped the stability of her ankle/foot. Exercises are getting easier for her to do. Still having quite a bit of trouble getting up in the mornings as she is very stiff and sore.    Pertinent History Has a history of extensive R ankle surgery that was botched initially, second surgery did not correct problem- R leg is shorter due to this structurally. Patient reports that her L foot, which is what she has been referred for, usually feels fine but will occasionally pop and collapse on her, seems to happen more when she is outdoors.  Has an appontment for orthotics for L ankle scheduled.    How long can you stand comfortably? 1/25- unable to give specific number but does feel taht iti s getting better   How long can you walk comfortably? 1/25- around 320ft with cane    Patient Stated Goals strengthen ankle    Currently in Pain? Yes   Pain Score 4    Pain Location Leg   Pain Orientation Left;Proximal  Pain Descriptors / Indicators Tingling            [facility] PT Assessment - 09/01/15 0001    Observation/Other Assessments   Focus on Therapeutic Outcomes (FOTO)  60% limited    AROM   Right Ankle Dorsiflexion 9   Right Ankle Plantar Flexion --  wfl    Right Ankle Inversion 10   Right Ankle Eversion 17   Left Ankle Dorsiflexion 8   Left Ankle Plantar Flexion --  wfl    Left Ankle Inversion 12   Left Ankle Eversion 30   Strength   Right Hip Flexion 4-/5   Left Hip Flexion 3/5  pain limited    Right Knee Flexion 4-/5   Right Knee Extension 4+/5   Left Knee Flexion 3-/5   Left Knee Extension 4/5   Right Ankle Dorsiflexion 4+/5   Right Ankle Inversion 4-/5   Right Ankle Eversion 4-/5   Left Ankle Dorsiflexion 4+/5    Left Ankle Inversion 5/5   Left Ankle Eversion 4+/5   6 minute walk test results    Aerobic Endurance Distance Walked 339   Endurance additional comments 3MWT, 1 rest break; gait speed 0.74m/s    High Level Balance   High Level Balance Comments TUG 12.3 without cane                              PT Education - 09/01/15 1357    Education provided Yes   Education Details plan of care moving forward, importance of correct footwear    Person(s) Educated Patient   Methods Explanation   Comprehension Verbalized understanding          PT Short Term Goals - 09/01/15 1336    PT SHORT TERM GOAL #1   Title Patient to be able to perform TUG balance test in 12 seconds or less with cane in order to demonstrate reduced fall risk and improved overall mobility    Baseline 1/25- 11 seconds without cane    Time 3   Period Weeks   Status Achieved   PT SHORT TERM GOAL #2   Title Patient will demosntrate improved overall gait mechanics with improved ability to bear weight equally, improved posture during gait, improved gait speed, and improved heel-toe pattern in order to promote improved efficiency with mobiltiy    Time 3   Period Weeks   Status On-going   PT SHORT TERM GOAL #3   Title Patient will be able to verbally describe the importance of proper foot wear and bracing/orthotics in order to enhance patient's self-efficacy and knowledge in managing condition    Time 3   Period Weeks   Status Achieved   PT SHORT TERM GOAL #4   Title Patient will be independent in correctly and consistently perform appropraite HEP, to be updated PRN    Baseline 1/25- doing every day    Time 3   Period Weeks   Status Achieved   PT SHORT TERM GOAL #5   Title Patient to be independent in correctly applying kinesiotape to affected region in order to assist in increasing regional stability and enhancing independence in managing condition    Time 3   Period Weeks   Status New            PT Long Term Goals - 09/01/15 1339    PT LONG TERM GOAL #1   Title Patient will demonstrate 5/5 strength in all tested  muscles in order to reduce chance of collapse and improve overall mobility    Time 6   Period Weeks   Status On-going   PT LONG TERM GOAL #2   Title Patient will be able to tolerate performing 6 minute walk test, with cane and unlimited rest breaks, in order to demonstrate improved overall endurance and mobility    Baseline 2022/09/17- limited by back today   Time 6   Period Weeks   Status On-going   PT LONG TERM GOAL #3   Title Patient to be able to tolerate shopping in grocery store with cane, no ankle collapse, and minimal unsteadiness in order to assist in improving her overall function and community participation    Baseline 09/17/2022- has been doing better with this    Time 6   Period Weeks   Status On-going   PT LONG TERM GOAL #4   Title Patient will report no collapse of L ankle within the past 3 weeks in order to reduce overall fall risk, potential risk of damage to tissues, and to improve overall mobility    Baseline September 17, 2022- no collapses in past 4 weeks    Time 6   Period Weeks   Status Achieved               Plan - 09-18-2015 1359    Clinical Impression Statement Re-assessment performed today. Patient shows improvement in strength, functional activity tolerance, gait distance and mechancis, and general functional ability during weight bearing activiteis. Did note that ankle ROM measures deviated greatly from iniital measures taken at initial evaluation and possibly attribute this to reduced ankle pain and muscle guarding in general region today, possibly also due to decreased anxiety of patient regarding her ankle in general. Patient reports that kinesiotaping appears to have helped with her overall foot/ankle symptoms as well as stability of the region. Recommend continuation of skilled PT services in order to continue to address functional limitations and assist in  reaching optimal level of function. Added kinesiotaping goal today as well.    Pt will benefit from skilled therapeutic intervention in order to improve on the following deficits Abnormal gait;Decreased endurance;Hypomobility;Decreased activity tolerance;Decreased strength;Pain;Difficulty walking;Decreased mobility;Decreased balance;Improper body mechanics;Decreased coordination;Impaired flexibility;Postural dysfunction   Rehab Potential Fair   Clinical Impairments Affecting Rehab Potential chronicity of problem; multiple medical and body structural factors (R foot/ankle, scoliosis, profound weakness, limited ability to participate in activity) contributing to issue    PT Frequency 2x / week   PT Duration 6 weeks   PT Treatment/Interventions ADLs/Self Care Home Management;Gait training;Functional mobility training;Therapeutic activities;Therapeutic exercise;Balance training;Neuromuscular re-education;Patient/family education;Energy conservation;Taping   PT Next Visit Plan Continue PT POC with focus on strength, balance and gait. Continue kinesiotape.  Hold manual due to laxity, not appropraite.  Conservative approach with pt in terms of types of activities due to easy aggravation of back pain.     PT Home Exercise Plan given    Consulted and Agree with Plan of Care Patient          G-Codes - 2015/09/18 1403    Functional Assessment Tool Used Per FOTO 60% limited    Functional Limitation Mobility: Walking and moving around   Mobility: Walking and Moving Around Current Status [id number]) At least 60 percent but less than 80 percent impaired, limited or restricted   Mobility: Walking and Moving Around Goal Status [id number]) At least 40 percent but less than 60 percent impaired, limited or restricted      Problem List Patient  Active Problem List   Diagnosis Date Noted  . Osteopenia 02/16/2014  . Hyperlipidemia 04/22/2013  . Hypothyroidism 04/22/2013  . GERD (gastroesophageal reflux disease)  01/01/2013  . Rheumatoid arthritis ([place]) 12/20/2012  . Essential hypertension, benign 12/20/2012  . Chronic pain syndrome 12/20/2012     Physical Therapy Progress Note  Dates of Reporting Period: 08/03/15 to 09/01/15  Objective Reports of Subjective Statement: see above   Objective Measurements: see above   Goal Update: see above   Plan: see above   Reason Skilled Services are Required: instruction in kinesiotaping, ankle stability, balance, advanced HEP    Deniece Ree PT, DPT [place], [place], [address] Phone: [phone]   Fax:  [phone]  Name: Sabrina Adams MRN: [id number] Date of Birth: 1945/11/12

## 2015-09-02 DIAGNOSIS — H401124 Primary open-angle glaucoma, left eye, indeterminate stage: Secondary | ICD-10-CM | POA: Diagnosis not present

## 2015-09-03 ENCOUNTER — Ambulatory Visit (HOSPITAL_COMMUNITY): Payer: Medicare Other

## 2015-09-03 DIAGNOSIS — M25572 Pain in left ankle and joints of left foot: Secondary | ICD-10-CM | POA: Diagnosis not present

## 2015-09-03 DIAGNOSIS — R262 Difficulty in walking, not elsewhere classified: Secondary | ICD-10-CM

## 2015-09-03 DIAGNOSIS — M629 Disorder of muscle, unspecified: Secondary | ICD-10-CM | POA: Diagnosis not present

## 2015-09-03 DIAGNOSIS — R2681 Unsteadiness on feet: Secondary | ICD-10-CM

## 2015-09-03 DIAGNOSIS — M259 Joint disorder, unspecified: Secondary | ICD-10-CM | POA: Diagnosis not present

## 2015-09-03 DIAGNOSIS — M76829 Posterior tibial tendinitis, unspecified leg: Secondary | ICD-10-CM

## 2015-09-03 DIAGNOSIS — R29898 Other symptoms and signs involving the musculoskeletal system: Secondary | ICD-10-CM

## 2015-09-03 NOTE — Therapy (Signed)
[place], [place], [address] Phone: [phone]   Fax:  [phone]  Physical Therapy Treatment  Patient Details  Name: Sabrina Adams MRN: [id number] Date of Birth: [birth year] Referring Provider: Mechele Claude [facility]   Encounter Date: 09/03/2015      PT End of Session - 09/03/15 1404    Visit Number 10   Number of Visits 16   Date for PT Re-Evaluation 09/29/15   Authorization Type Medicare/[employer] UHC supplemental plan f    Authorization Time Period 08/03/15 to 10/04/15   Authorization - Visit Number 10   Authorization - Number of Visits 19   PT Start Time 1350   PT Stop Time 1438   PT Time Calculation (min) 48 min   Activity Tolerance Patient tolerated treatment well   Behavior During Therapy [facility] for tasks assessed/performed      Past Medical History  Diagnosis Date  . Complication of anesthesia     nausea ,  psycotics episodes  . Hypertension   . Hypothyroidism   . Sleep apnea   . GERD (gastroesophageal reflux disease)   . Neuromuscular disorder (HCC)     neuropathy  legs  . Arthritis     rheumatoid  . Anemia   . Stroke (HCC)     mild  . RA (rheumatoid arthritis) [facility])     Past Surgical History  Procedure Laterality Date  . Lumbar spine surgery  2005  . Abdominal hysterectomy  2005  . Colon surgery      colostomy then revision  . Hand arthroplasty    . Ankle surgery    . Colonoscopy  04/04/2012    Procedure: COLONOSCOPY;  Surgeon: Rogene Houston, MD;  [facility];  Service: Endoscopy;  Laterality: N/A;  410-Ok per [place]  . Esophagogastroduodenoscopy  04/04/2012    Procedure: ESOPHAGOGASTRODUODENOSCOPY (EGD);  Surgeon: Rogene Houston, MD;  [facility];  Service: Endoscopy;  Laterality: N/A;  possible  . Abdominal surgery      2006 colectomy for diverticulitis    There were no vitals filed for this visit.  Visit Diagnosis:  Posterior tibialis muscle  dysfunction  Left ankle pain  Ankle weakness  Difficulty walking  Unsteadiness      Subjective Assessment - 09/03/15 1358    Subjective Pt stated she had increased pain Bil ankles today, pain scale 4/10.  Pt reports relief wtih kinesiotaping   Pertinent History Has a history of extensive R ankle surgery that was botched initially, second surgery did not correct problem- R leg is shorter due to this structurally. Patient reports that her L foot, which is what she has been referred for, usually feels fine but will occasionally pop and collapse on her, seems to happen more when she is outdoors.  Has an appontment for orthotics for L ankle scheduled.    Patient Stated Goals strengthen ankle    Currently in Pain? Yes   Pain Score 4    Pain Location Ankle   Pain Orientation Right;Left   Pain Descriptors / Indicators Aching                         [facility] Adult PT Treatment/Exercise - 09/03/15 0001    Knee/Hip Exercises: Aerobic   Nustep 8' hill level 2 resistance 2 end of session   Knee/Hip Exercises: Standing   Hip Abduction Both;10 reps  2 in step   Knee/Hip Exercises: Sidelying  Hip ABduction Both;10 reps   Manual Therapy   Manual Therapy Taping   Manual therapy comments beginning of session instructed kinesiotaping for posterior tib   Ankle Exercises: Seated   Heel Raises 20 reps   Toe Raise 20 reps   BAPS Sitting;Level 3;10 reps   Ankle Exercises: Standing   Rocker Board Limitations 2 min L/R             Balance Exercises - 09/03/15 1433    Balance Exercises: Standing   Standing Eyes Opened Narrow base of support (BOS);Foam/compliant surface;2 reps;30 secs   Tandem Stance Eyes open;Foam/compliant surface;2 reps;30 secs   Step Over Hurdles / [place] 6 in hurdles in // bars 2RT             PT Short Term Goals - 09/01/15 1336    PT SHORT TERM GOAL #1   Title Patient to be able to perform TUG balance test in 12 seconds or less with cane in order  to demonstrate reduced fall risk and improved overall mobility    Baseline 1/25- 11 seconds without cane    Time 3   Period Weeks   Status Achieved   PT SHORT TERM GOAL #2   Title Patient will demosntrate improved overall gait mechanics with improved ability to bear weight equally, improved posture during gait, improved gait speed, and improved heel-toe pattern in order to promote improved efficiency with mobiltiy    Time 3   Period Weeks   Status On-going   PT SHORT TERM GOAL #3   Title Patient will be able to verbally describe the importance of proper foot wear and bracing/orthotics in order to enhance patient's self-efficacy and knowledge in managing condition    Time 3   Period Weeks   Status Achieved   PT SHORT TERM GOAL #4   Title Patient will be independent in correctly and consistently perform appropraite HEP, to be updated PRN    Baseline 1/25- doing every day    Time 3   Period Weeks   Status Achieved   PT SHORT TERM GOAL #5   Title Patient to be independent in correctly applying kinesiotape to affected region in order to assist in increasing regional stability and enhancing independence in managing condition    Time 3   Period Weeks   Status New           PT Long Term Goals - 09/01/15 1339    PT LONG TERM GOAL #1   Title Patient will demonstrate 5/5 strength in all tested muscles in order to reduce chance of collapse and improve overall mobility    Time 6   Period Weeks   Status On-going   PT LONG TERM GOAL #2   Title Patient will be able to tolerate performing 6 minute walk test, with cane and unlimited rest breaks, in order to demonstrate improved overall endurance and mobility    Baseline 1/25- limited by back today   Time 6   Period Weeks   Status On-going   PT LONG TERM GOAL #3   Title Patient to be able to tolerate shopping in grocery store with cane, no ankle collapse, and minimal unsteadiness in order to assist in improving her overall function and  community participation    Baseline 1/25- has been doing better with this    Time 6   Period Weeks   Status On-going   PT LONG TERM GOAL #4   Title Patient will report no collapse of L  ankle within the past 3 weeks in order to reduce overall fall risk, potential risk of damage to tissues, and to improve overall mobility    Baseline 1/25- no collapses in past 4 weeks    Time 6   Period Weeks   Status Achieved               Plan - 09/03/15 1404    Clinical Impression Statement Began session educating pt with kinesiotape placement to assist with posterior tibalis facilitation for arch support, all exercises complete with tape following placement.  Session focus on improving LE strengthening and static balance training.  Min assistance required for proper form wtih exercises and to reduce LOB during static balance activities.  Progressed to 6in hurdles for functional strengthening and balance training with SLS activities, complete today inside // bars with intermittent HHA.  Pt reports pain reduced at end of sessoin.     PT Next Visit Plan Continue PT POC with focus on strength, balance and gait. Continue kinesiotape.  Hold manual due to laxity, not appropraite.  Conservative approach with pt in terms of types of activities due to easy aggravation of back pain.          Problem List Patient Active Problem List   Diagnosis Date Noted  . Osteopenia 02/16/2014  . Hyperlipidemia 04/22/2013  . Hypothyroidism 04/22/2013  . GERD (gastroesophageal reflux disease) 01/01/2013  . Rheumatoid arthritis ([place]) 12/20/2012  . Essential hypertension, benign 12/20/2012  . Chronic pain syndrome 12/20/2012   Ihor Austin, LPTA; [place]  Aldona Lento 09/03/2015, 2:45 PM  [place] [address] [place], [place], [address] Phone: [phone]   Fax:  [phone]  Name: CAPUCINE BUSENBARK MRN: [id number] Date of Birth:  May 05, 1946

## 2015-09-06 ENCOUNTER — Ambulatory Visit (HOSPITAL_COMMUNITY): Payer: Medicare Other

## 2015-09-06 DIAGNOSIS — R2681 Unsteadiness on feet: Secondary | ICD-10-CM | POA: Diagnosis not present

## 2015-09-06 DIAGNOSIS — M25572 Pain in left ankle and joints of left foot: Secondary | ICD-10-CM

## 2015-09-06 DIAGNOSIS — M629 Disorder of muscle, unspecified: Secondary | ICD-10-CM | POA: Diagnosis not present

## 2015-09-06 DIAGNOSIS — R262 Difficulty in walking, not elsewhere classified: Secondary | ICD-10-CM

## 2015-09-06 DIAGNOSIS — M76829 Posterior tibial tendinitis, unspecified leg: Secondary | ICD-10-CM

## 2015-09-06 DIAGNOSIS — M259 Joint disorder, unspecified: Secondary | ICD-10-CM | POA: Diagnosis not present

## 2015-09-06 DIAGNOSIS — R29898 Other symptoms and signs involving the musculoskeletal system: Secondary | ICD-10-CM

## 2015-09-06 NOTE — Therapy (Signed)
[place], [place], [address] Phone: [phone]   Fax:  [phone]  Physical Therapy Treatment  Patient Details  Name: Sabrina Adams MRN: [id number] Date of Birth: [birth year] Referring Provider: Mechele Claude [facility]   Encounter Date: 09/06/2015      PT End of Session - 09/06/15 1401    Visit Number 11   Number of Visits 16   Date for PT Re-Evaluation 09/29/15   Authorization Type Medicare/[employer] UHC supplemental plan f    Authorization Time Period 08/03/15 to 10/04/15   Authorization - Visit Number 11   Authorization - Number of Visits 19   PT Start Time 1350   PT Stop Time 1428   PT Time Calculation (min) 38 min   Activity Tolerance Patient tolerated treatment well   Behavior During Therapy [facility] for tasks assessed/performed      Past Medical History  Diagnosis Date  . Complication of anesthesia     nausea ,  psycotics episodes  . Hypertension   . Hypothyroidism   . Sleep apnea   . GERD (gastroesophageal reflux disease)   . Neuromuscular disorder (HCC)     neuropathy  legs  . Arthritis     rheumatoid  . Anemia   . Stroke (HCC)     mild  . RA (rheumatoid arthritis) [facility])     Past Surgical History  Procedure Laterality Date  . Lumbar spine surgery  2005  . Abdominal hysterectomy  2005  . Colon surgery      colostomy then revision  . Hand arthroplasty    . Ankle surgery    . Colonoscopy  04/04/2012    Procedure: COLONOSCOPY;  Surgeon: Rogene Houston, MD;  [facility];  Service: Endoscopy;  Laterality: N/A;  410-Ok per [place]  . Esophagogastroduodenoscopy  04/04/2012    Procedure: ESOPHAGOGASTRODUODENOSCOPY (EGD);  Surgeon: Rogene Houston, MD;  [facility];  Service: Endoscopy;  Laterality: N/A;  possible  . Abdominal surgery      2006 colectomy for diverticulitis    There were no vitals filed for this visit.  Visit Diagnosis:  Posterior tibialis muscle  dysfunction  Left ankle pain  Ankle weakness  Difficulty walking  Unsteadiness      Subjective Assessment - 09/06/15 1357    Subjective Pt c/o Rt ankle popping and increased pain following new machine last session, current pain scale 4-5/10 Rt ankle.  Reports kinesiotaping is assisting, stayed on over weekend.   Pertinent History Has a history of extensive R ankle surgery that was botched initially, second surgery did not correct problem- R leg is shorter due to this structurally. Patient reports that her L foot, which is what she has been referred for, usually feels fine but will occasionally pop and collapse on her, seems to happen more when she is outdoors.  Has an appontment for orthotics for L ankle scheduled.    Patient Stated Goals strengthen ankle    Currently in Pain? Yes   Pain Score 5    Pain Location Ankle   Pain Orientation Right   Pain Descriptors / Indicators --  "Catches and pop's"                [facility] Adult PT Treatment/Exercise - 09/06/15 0001    Knee/Hip Exercises: Supine   Bridges 10 reps   Straight Leg Raises 10 reps;Both   Knee/Hip Exercises: Sidelying   Hip ABduction Both;2 sets;10 reps   Manual  Therapy   Manual therapy comments no taping required this session, still intact since last session   Ankle Exercises: Seated   Heel Raises 20 reps  4# around ankle   Toe Raise 20 reps  4#   BAPS Sitting;Level 4;10 reps   Other Seated Ankle Exercises pen-coin exercise for arch           PT Short Term Goals - 09/01/15 1336    PT SHORT TERM GOAL #1   Title Patient to be able to perform TUG balance test in 12 seconds or less with cane in order to demonstrate reduced fall risk and improved overall mobility    Baseline 1/25- 11 seconds without cane    Time 3   Period Weeks   Status Achieved   PT SHORT TERM GOAL #2   Title Patient will demosntrate improved overall gait mechanics with improved ability to bear weight equally, improved posture during  gait, improved gait speed, and improved heel-toe pattern in order to promote improved efficiency with mobiltiy    Time 3   Period Weeks   Status On-going   PT SHORT TERM GOAL #3   Title Patient will be able to verbally describe the importance of proper foot wear and bracing/orthotics in order to enhance patient's self-efficacy and knowledge in managing condition    Time 3   Period Weeks   Status Achieved   PT SHORT TERM GOAL #4   Title Patient will be independent in correctly and consistently perform appropraite HEP, to be updated PRN    Baseline 1/25- doing every day    Time 3   Period Weeks   Status Achieved   PT SHORT TERM GOAL #5   Title Patient to be independent in correctly applying kinesiotape to affected region in order to assist in increasing regional stability and enhancing independence in managing condition    Time 3   Period Weeks   Status New           PT Long Term Goals - 09/01/15 1339    PT LONG TERM GOAL #1   Title Patient will demonstrate 5/5 strength in all tested muscles in order to reduce chance of collapse and improve overall mobility    Time 6   Period Weeks   Status On-going   PT LONG TERM GOAL #2   Title Patient will be able to tolerate performing 6 minute walk test, with cane and unlimited rest breaks, in order to demonstrate improved overall endurance and mobility    Baseline 1/25- limited by back today   Time 6   Period Weeks   Status On-going   PT LONG TERM GOAL #3   Title Patient to be able to tolerate shopping in grocery store with cane, no ankle collapse, and minimal unsteadiness in order to assist in improving her overall function and community participation    Baseline 1/25- has been doing better with this    Time 6   Period Weeks   Status On-going   PT LONG TERM GOAL #4   Title Patient will report no collapse of L ankle within the past 3 weeks in order to reduce overall fall risk, potential risk of damage to tissues, and to improve  overall mobility    Baseline 1/25- no collapses in past 4 weeks    Time 6   Period Weeks   Status Achieved               Plan - 09/06/15 1409  Clinical Impression Statement Held standing exercises this session following reports of increased pain Rt ankle (tx focus on Lt posterior tib).  Increased session focus on improving strengthening with proximal and ankle musculature and arch support.  Pt reported the kinesiotape still intact from last session, no taping complete this session.  Increased reps with hip strengthening exercises and added weight with seated heel and toe raises for LE strengthening, pt limited by fatigue wtth increased demand, no reports of increased pain.   PT Next Visit Plan Continue PT POC with focus on strength, balance and gait. Instruct kinesiotape application to add to HEP.  Hold manual due to laxity, not appropraite.  Conservative approach with pt in terms of types of activities due to easy aggravation of back pain.          Problem List Patient Active Problem List   Diagnosis Date Noted  . Osteopenia 02/16/2014  . Hyperlipidemia 04/22/2013  . Hypothyroidism 04/22/2013  . GERD (gastroesophageal reflux disease) 01/01/2013  . Rheumatoid arthritis ([place]) 12/20/2012  . Essential hypertension, benign 12/20/2012  . Chronic pain syndrome 12/20/2012   Ihor [place], [place]; [place]  Aldona Lento 09/06/2015, 2:28 PM  [place] [address] [place], [place], [address] Phone: [phone]   Fax:  [phone]  Name: Sabrina Adams MRN: [id number] Date of Birth: [birth year]

## 2015-09-08 ENCOUNTER — Ambulatory Visit (HOSPITAL_COMMUNITY): Payer: Medicare Other | Attending: Student | Admitting: Physical Therapy

## 2015-09-08 DIAGNOSIS — M259 Joint disorder, unspecified: Secondary | ICD-10-CM | POA: Diagnosis not present

## 2015-09-08 DIAGNOSIS — R2681 Unsteadiness on feet: Secondary | ICD-10-CM | POA: Diagnosis not present

## 2015-09-08 DIAGNOSIS — M629 Disorder of muscle, unspecified: Secondary | ICD-10-CM | POA: Insufficient documentation

## 2015-09-08 DIAGNOSIS — R262 Difficulty in walking, not elsewhere classified: Secondary | ICD-10-CM

## 2015-09-08 DIAGNOSIS — M25572 Pain in left ankle and joints of left foot: Secondary | ICD-10-CM | POA: Diagnosis not present

## 2015-09-08 DIAGNOSIS — M76829 Posterior tibial tendinitis, unspecified leg: Secondary | ICD-10-CM

## 2015-09-08 DIAGNOSIS — R29898 Other symptoms and signs involving the musculoskeletal system: Secondary | ICD-10-CM

## 2015-09-08 NOTE — Therapy (Signed)
[place], [place], [address] Phone: [phone]   Fax:  [phone]  Physical Therapy Treatment  Patient Details  Name: Sabrina Adams MRN: [id number] Date of Birth: 03-Apr-1946 Referring Provider: Mechele Claude [facility]   Encounter Date: 09/08/2015      PT End of Session - 09/08/15 1439    Visit Number 12   Number of Visits 16   Date for PT Re-Evaluation 09/29/15   Authorization Type Medicare/[employer] UHC supplemental plan f    Authorization Time Period 08/03/15 to 10/04/15   Authorization - Visit Number 12   Authorization - Number of Visits 19   PT Start Time [id number]   PT Stop Time 1345   PT Time Calculation (min) 42 min   Activity Tolerance Patient tolerated treatment well   Behavior During Therapy [facility] for tasks assessed/performed      Past Medical History  Diagnosis Date  . Complication of anesthesia     nausea ,  psycotics episodes  . Hypertension   . Hypothyroidism   . Sleep apnea   . GERD (gastroesophageal reflux disease)   . Neuromuscular disorder (HCC)     neuropathy  legs  . Arthritis     rheumatoid  . Anemia   . Stroke (HCC)     mild  . RA (rheumatoid arthritis) [facility]     Past Surgical History  Procedure Laterality Date  . Lumbar spine surgery  2005  . Abdominal hysterectomy  2005  . Colon surgery      colostomy then revision  . Hand arthroplasty    . Ankle surgery    . Colonoscopy  04/04/2012    Procedure: COLONOSCOPY;  Surgeon: Rogene Houston, MD;  [facility];  Service: Endoscopy;  Laterality: N/A;  410-Ok per [place]  . Esophagogastroduodenoscopy  04/04/2012    Procedure: ESOPHAGOGASTRODUODENOSCOPY (EGD);  Surgeon: Rogene Houston, MD;  [facility];  Service: Endoscopy;  Laterality: N/A;  possible  . Abdominal surgery      2006 colectomy for diverticulitis    There were no vitals filed for this visit.  Visit Diagnosis:  Posterior tibialis muscle  dysfunction  Left ankle pain  Ankle weakness  Difficulty walking  Unsteadiness      Subjective Assessment - 09/08/15 1305    Subjective Patient reports that she is doing well today, having minimal pain today but would like to stay away from Nustep due to ankle popping which has improved since not doing Nustep last session    Pertinent History Has a history of extensive R ankle surgery that was botched initially, second surgery did not correct problem- R leg is shorter due to this structurally. Patient reports that her L foot, which is what she has been referred for, usually feels fine but will occasionally pop and collapse on her, seems to happen more when she is outdoors.  Has an appontment for orthotics for L ankle scheduled.    Currently in Pain? Yes   Pain Score 1    Pain Location Back   Pain Orientation Lower                         [facility] Adult PT Treatment/Exercise - 09/08/15 0001    Knee/Hip Exercises: Seated   Long Arc Quad Left;1 set;10 reps   Long Arc Quad Weight 1 lbs.   Long [employer] Limitations 2 sets    Hamstring Curl Left;1 set;10  reps   Hamstring Limitations 2 second hold     Hamstring Weights 1 lbs.   Knee/Hip Exercises: Supine   Bridges 10 reps   Knee/Hip Exercises: Sidelying   Hip ABduction Both;2 sets;10 reps   Hip ABduction Limitations added 1# weight to L LE today    Manual Therapy   Manual Therapy Taping   Manual therapy comments PTA put kinesiotape to L posterior tib at beginning of session today    Ankle Exercises: Seated   Towel Inversion/Eversion Other (comment)  x10 inversion/eversion green TB    Heel Raises 20 reps;Other (comment)  4 puond weight    Toe Raise 20 reps;Other (comment)  4# weight    BAPS Sitting   Other Seated Ankle Exercises pen coin 2x15    Ankle Exercises: Standing   Rocker Board Limitations 2 min L/R             Balance Exercises - 09/08/15 1333    Balance Exercises: Standing   Tandem Stance Eyes  open;Foam/compliant surface;3 reps;20 secs   Other Standing Exercises air pad undder L foot, lateral weight shifts x10           PT Education - 09/08/15 1439    Education provided No          PT Short Term Goals - 09/01/15 1336    PT SHORT TERM GOAL #1   Title Patient to be able to perform TUG balance test in 12 seconds or less with cane in order to demonstrate reduced fall risk and improved overall mobility    Baseline 1/25- 11 seconds without cane    Time 3   Period Weeks   Status Achieved   PT SHORT TERM GOAL #2   Title Patient will demosntrate improved overall gait mechanics with improved ability to bear weight equally, improved posture during gait, improved gait speed, and improved heel-toe pattern in order to promote improved efficiency with mobiltiy    Time 3   Period Weeks   Status On-going   PT SHORT TERM GOAL #3   Title Patient will be able to verbally describe the importance of proper foot wear and bracing/orthotics in order to enhance patient's self-efficacy and knowledge in managing condition    Time 3   Period Weeks   Status Achieved   PT SHORT TERM GOAL #4   Title Patient will be independent in correctly and consistently perform appropraite HEP, to be updated PRN    Baseline 1/25- doing every day    Time 3   Period Weeks   Status Achieved   PT SHORT TERM GOAL #5   Title Patient to be independent in correctly applying kinesiotape to affected region in order to assist in increasing regional stability and enhancing independence in managing condition    Time 3   Period Weeks   Status New           PT Long Term Goals - 09/01/15 1339    PT LONG TERM GOAL #1   Title Patient will demonstrate 5/5 strength in all tested muscles in order to reduce chance of collapse and improve overall mobility    Time 6   Period Weeks   Status On-going   PT LONG TERM GOAL #2   Title Patient will be able to tolerate performing 6 minute walk test, with cane and unlimited  rest breaks, in order to demonstrate improved overall endurance and mobility    Baseline 1/25- limited by back today   Time 6  Period Weeks   Status On-going   PT LONG TERM GOAL #3   Title Patient to be able to tolerate shopping in grocery store with cane, no ankle collapse, and minimal unsteadiness in order to assist in improving her overall function and community participation    Baseline 1/25- has been doing better with this    Time 6   Period Weeks   Status On-going   PT LONG TERM GOAL #4   Title Patient will report no collapse of L ankle within the past 3 weeks in order to reduce overall fall risk, potential risk of damage to tissues, and to improve overall mobility    Baseline 1/25- no collapses in past 4 weeks    Time 6   Period Weeks   Status Achieved               Plan - 09/08/15 1337    Clinical Impression Statement Continued to hold Nustep today due to reports of symptoms in L ankle with this machine; patient does report that her L ankle has been feeling better since holding machine. PTA applied fresh kinesiotape at beginning of session today. Othe[place]ise focused on functional  strenghtening  and balance skills today. Difficulty maintaining ankle stabilization on airpad even with bilateral  LE support on floor. Added weight to L LE hip abduction today but do continue to note muscle fatigue in this area. Patient continues to demonstrate quite a bit of functional weakness in general.    Pt will benefit from skilled therapeutic intervention in order to improve on the following deficits Abnormal gait;Decreased endurance;Hypomobility;Decreased activity tolerance;Decreased strength;Pain;Difficulty walking;Decreased mobility;Decreased balance;Improper body mechanics;Decreased coordination;Impaired flexibility;Postural dysfunction   Rehab Potential Fair   Clinical Impairments Affecting Rehab Potential chronicity of problem; multiple medical and body structural factors (R foot/ankle,  scoliosis, profound weakness, limited ability to participate in activity) contributing to issue    PT Frequency 2x / week   PT Duration 6 weeks   PT Treatment/Interventions ADLs/Self Care Home Management;Gait training;Functional mobility training;Therapeutic activities;Therapeutic exercise;Balance training;Neuromuscular re-education;Patient/family education;Energy conservation;Taping   PT Next Visit Plan Continue PT POC with focus on strength, balance and gait. Instruct kinesiotape application to add to HEP.  Hold manual due to laxity, not appropraite.  Conservative approach with pt in terms of types of activities due to easy aggravation of back pain.     PT Home Exercise Plan given    Consulted and Agree with Plan of Care Patient        Problem List Patient Active Problem List   Diagnosis Date Noted  . Osteopenia 02/16/2014  . Hyperlipidemia 04/22/2013  . Hypothyroidism 04/22/2013  . GERD (gastroesophageal reflux disease) 01/01/2013  . Rheumatoid arthritis ([place]) 12/20/2012  . Essential hypertension, benign 12/20/2012  . Chronic pain syndrome 12/20/2012    Deniece Ree PT, DPT [place] [address] [place], [place], [address] Phone: [phone]   Fax:  [phone]  Name: Sabrina Adams MRN: [id number] Date of Birth: [birth year]

## 2015-09-10 ENCOUNTER — Encounter (HOSPITAL_COMMUNITY): Payer: Medicare Other

## 2015-09-14 ENCOUNTER — Ambulatory Visit (HOSPITAL_COMMUNITY): Payer: Medicare Other | Admitting: Physical Therapy

## 2015-09-14 DIAGNOSIS — R2681 Unsteadiness on feet: Secondary | ICD-10-CM | POA: Diagnosis not present

## 2015-09-14 DIAGNOSIS — M629 Disorder of muscle, unspecified: Secondary | ICD-10-CM | POA: Diagnosis not present

## 2015-09-14 DIAGNOSIS — M76829 Posterior tibial tendinitis, unspecified leg: Secondary | ICD-10-CM

## 2015-09-14 DIAGNOSIS — R262 Difficulty in walking, not elsewhere classified: Secondary | ICD-10-CM | POA: Diagnosis not present

## 2015-09-14 DIAGNOSIS — M259 Joint disorder, unspecified: Secondary | ICD-10-CM | POA: Diagnosis not present

## 2015-09-14 DIAGNOSIS — M25572 Pain in left ankle and joints of left foot: Secondary | ICD-10-CM

## 2015-09-14 DIAGNOSIS — R29898 Other symptoms and signs involving the musculoskeletal system: Secondary | ICD-10-CM

## 2015-09-14 NOTE — Therapy (Signed)
[place], [place], [address] Phone: [phone]   Fax:  [phone]  Physical Therapy Treatment  Patient Details  Name: Sabrina Adams MRN: [id number] Date of Birth: 02/20/1946 Referring Provider: Mechele Claude [facility]   Encounter Date: 09/14/2015      PT End of Session - 09/14/15 1210    Visit Number 13   Number of Visits 16   Date for PT Re-Evaluation 09/29/15   Authorization Type Medicare/[employer] UHC supplemental plan f    Authorization Time Period 08/03/15 to 10/04/15   Authorization - Visit Number 13   Authorization - Number of Visits 19   PT Start Time 1020   PT Stop Time 1058   PT Time Calculation (min) 38 min   Activity Tolerance Patient tolerated treatment well   Behavior During Therapy [facility] for tasks assessed/performed      Past Medical History  Diagnosis Date  . Complication of anesthesia     nausea ,  psycotics episodes  . Hypertension   . Hypothyroidism   . Sleep apnea   . GERD (gastroesophageal reflux disease)   . Neuromuscular disorder (HCC)     neuropathy  legs  . Arthritis     rheumatoid  . Anemia   . Stroke (HCC)     mild  . RA (rheumatoid arthritis) [facility])     Past Surgical History  Procedure Laterality Date  . Lumbar spine surgery  2005  . Abdominal hysterectomy  2005  . Colon surgery      colostomy then revision  . Hand arthroplasty    . Ankle surgery    . Colonoscopy  04/04/2012    Procedure: COLONOSCOPY;  Surgeon: Rogene Houston, MD;  [facility];  Service: Endoscopy;  Laterality: N/A;  410-Ok per [place]  . Esophagogastroduodenoscopy  04/04/2012    Procedure: ESOPHAGOGASTRODUODENOSCOPY (EGD);  Surgeon: Rogene Houston, MD;  Location[facility];  Service: Endoscopy;  Laterality: N/A;  possible  . Abdominal surgery      2006 colectomy for diverticulitis    There were no vitals filed for this visit.  Visit Diagnosis:  Posterior tibialis muscle  dysfunction  Left ankle pain  Ankle weakness  Difficulty walking  Unsteadiness      Subjective Assessment - 09/14/15 1022    Subjective Patient reports she is doing well, felt very worn out after last session however felt good. Kinesiotape should be delivered to her today and she reports that she is comfortable with application of it.    Pertinent History Has a history of extensive R ankle surgery that was botched initially, second surgery did not correct problem- R leg is shorter due to this structurally. Patient reports that her L foot, which is what she has been referred for, usually feels fine but will occasionally pop and collapse on her, seems to happen more when she is outdoors.  Has an appontment for orthotics for L ankle scheduled.    Currently in Pain? Yes   Pain Score 1    Pain Location Back  and hip    Pain Orientation Lower;Left   Pain Descriptors / Indicators Aching;Numbness;Tingling   Pain Type Chronic pain   Aggravating Factors  moving the wrong way makes it worse    Pain Relieving Factors tramadol                          [facility] Adult PT Treatment/Exercise - 09/14/15  0001    Knee/Hip Exercises: Seated   Long Arc Quad Left;1 set;10 reps   Long Arc Quad Weight 1 lbs.   Long Arc Quad Limitations 2 sets    Hamstring Curl Left;1 set;10 reps   Hamstring Limitations 2 second hold     Hamstring Weights 1 lbs.   Sit to Sand without UE support;Other (comment)  3 with hands, 3 without hands    Ankle Exercises: Seated   Heel Raises 20 reps;Other (comment)  with and without weight    Toe Raise 20 reps;Other (comment)   Other Seated Ankle Exercises pen coin 1x20    Other Seated Ankle Exercises inversion/eversion blue TB 1x15   Ankle Exercises: Standing   Rocker Board Limitations 2 min L/R             Balance Exercises - 09/14/15 1049    Balance Exercises: Standing   Standing Eyes Opened Narrow base of support (BOS);Foam/compliant surface;2  reps;30 secs   Tandem Stance Eyes open;Foam/compliant surface;20 secs;2 reps   Other Standing Exercises air pad undder L foot, lateral weight shifts x2 minutes            PT Education - 09/14/15 1210    Education provided Yes   Education Details trial self-kinesiotape at home today, will give feedback next session    Person(s) Educated Patient   Methods Explanation   Comprehension Verbalized understanding          PT Short Term Goals - 09/01/15 1336    PT SHORT TERM GOAL #1   Title Patient to be able to perform TUG balance test in 12 seconds or less with cane in order to demonstrate reduced fall risk and improved overall mobility    Baseline 1/25- 11 seconds without cane    Time 3   Period Weeks   Status Achieved   PT SHORT TERM GOAL #2   Title Patient will demosntrate improved overall gait mechanics with improved ability to bear weight equally, improved posture during gait, improved gait speed, and improved heel-toe pattern in order to promote improved efficiency with mobiltiy    Time 3   Period Weeks   Status On-going   PT SHORT TERM GOAL #3   Title Patient will be able to verbally describe the importance of proper foot wear and bracing/orthotics in order to enhance patient's self-efficacy and knowledge in managing condition    Time 3   Period Weeks   Status Achieved   PT SHORT TERM GOAL #4   Title Patient will be independent in correctly and consistently perform appropraite HEP, to be updated PRN    Baseline 1/25- doing every day    Time 3   Period Weeks   Status Achieved   PT SHORT TERM GOAL #5   Title Patient to be independent in correctly applying kinesiotape to affected region in order to assist in increasing regional stability and enhancing independence in managing condition    Time 3   Period Weeks   Status New           PT Long Term Goals - 09/01/15 1339    PT LONG TERM GOAL #1   Title Patient will demonstrate 5/5 strength in all tested muscles in  order to reduce chance of collapse and improve overall mobility    Time 6   Period Weeks   Status On-going   PT LONG TERM GOAL #2   Title Patient will be able to tolerate performing 6 minute walk test, with  cane and unlimited rest breaks, in order to demonstrate improved overall endurance and mobility    Baseline 1/25- limited by back today   Time 6   Period Weeks   Status On-going   PT LONG TERM GOAL #3   Title Patient to be able to tolerate shopping in grocery store with cane, no ankle collapse, and minimal unsteadiness in order to assist in improving her overall function and community participation    Baseline 1/25- has been doing better with this    Time 6   Period Weeks   Status On-going   PT LONG TERM GOAL #4   Title Patient will report no collapse of L ankle within the past 3 weeks in order to reduce overall fall risk, potential risk of damage to tissues, and to improve overall mobility    Baseline 1/25- no collapses in past 4 weeks    Time 6   Period Weeks   Status Achieved               Plan - 09/14/15 1211    Clinical Impression Statement Continued functional strengthening and stasbility exercises today as well as balance exercises as well. Also address LE musculature in general to assist in stabilizing kinetic chain. Patient reports that her personal kinesiotape should be coming today; instructed her to trial putting it on herslef today and PT team will give her feedback next session.    Pt will benefit from skilled therapeutic intervention in order to improve on the following deficits Abnormal gait;Decreased endurance;Hypomobility;Decreased activity tolerance;Decreased strength;Pain;Difficulty walking;Decreased mobility;Decreased balance;Improper body mechanics;Decreased coordination;Impaired flexibility;Postural dysfunction   Rehab Potential Fair   Clinical Impairments Affecting Rehab Potential chronicity of problem; multiple medical and body structural factors (R  foot/ankle, scoliosis, profound weakness, limited ability to participate in activity) contributing to issue    PT Frequency 2x / week   PT Duration 6 weeks   PT Treatment/Interventions ADLs/Self Care Home Management;Gait training;Functional mobility training;Therapeutic activities;Therapeutic exercise;Balance training;Neuromuscular re-education;Patient/family education;Energy conservation;Taping   PT Next Visit Plan Continue PT POC with focus on strength, balance and gait. Instruct kinesiotape application to add to HEP.  Hold manual due to laxity, not appropraite.  Conservative approach with pt in terms of types of activities due to easy aggravation of back pain.     PT Home Exercise Plan given    Consulted and Agree with Plan of Care Patient        Problem List Patient Active Problem List   Diagnosis Date Noted  . Osteopenia 02/16/2014  . Hyperlipidemia 04/22/2013  . Hypothyroidism 04/22/2013  . GERD (gastroesophageal reflux disease) 01/01/2013  . Rheumatoid arthritis ([place]) 12/20/2012  . Essential hypertension, benign 12/20/2012  . Chronic pain syndrome 12/20/2012    Deniece Ree PT, DPT [place] [address] [place], [place], [address] Phone: [phone]   Fax:  [phone]  Name: Sabrina Adams MRN: [id number] Date of Birth: 03-31-1946

## 2015-09-15 ENCOUNTER — Encounter (HOSPITAL_COMMUNITY): Payer: Medicare Other | Admitting: Physical Therapy

## 2015-09-17 ENCOUNTER — Ambulatory Visit (HOSPITAL_COMMUNITY): Payer: Medicare Other

## 2015-09-17 DIAGNOSIS — M629 Disorder of muscle, unspecified: Secondary | ICD-10-CM | POA: Diagnosis not present

## 2015-09-17 DIAGNOSIS — R29898 Other symptoms and signs involving the musculoskeletal system: Secondary | ICD-10-CM

## 2015-09-17 DIAGNOSIS — R262 Difficulty in walking, not elsewhere classified: Secondary | ICD-10-CM | POA: Diagnosis not present

## 2015-09-17 DIAGNOSIS — M76829 Posterior tibial tendinitis, unspecified leg: Secondary | ICD-10-CM

## 2015-09-17 DIAGNOSIS — M25572 Pain in left ankle and joints of left foot: Secondary | ICD-10-CM

## 2015-09-17 DIAGNOSIS — R2681 Unsteadiness on feet: Secondary | ICD-10-CM | POA: Diagnosis not present

## 2015-09-17 DIAGNOSIS — M259 Joint disorder, unspecified: Secondary | ICD-10-CM | POA: Diagnosis not present

## 2015-09-17 NOTE — Therapy (Signed)
[place], [place], [address] Phone: [phone]   Fax:  [phone]  Physical Therapy Treatment  Patient Details  Name: Sabrina Adams MRN: [id number] Date of Birth: 12-07-1945 Referring Provider: Mechele Claude [facility]   Encounter Date: 09/17/2015      PT End of Session - 09/17/15 1326    Visit Number 14   Number of Visits 16   Date for PT Re-Evaluation 09/29/15   Authorization Type Medicare/[employer] UHC supplemental plan f    Authorization Time Period 08/03/15 to 10/04/15   Authorization - Visit Number 15   Authorization - Number of Visits 19   PT Start Time 1302   PT Stop Time 1343   PT Time Calculation (min) 41 min   Activity Tolerance Patient tolerated treatment well   Behavior During Therapy [facility] for tasks assessed/performed      Past Medical History  Diagnosis Date  . Complication of anesthesia     nausea ,  psycotics episodes  . Hypertension   . Hypothyroidism   . Sleep apnea   . GERD (gastroesophageal reflux disease)   . Neuromuscular disorder (HCC)     neuropathy  legs  . Arthritis     rheumatoid  . Anemia   . Stroke (HCC)     mild  . RA (rheumatoid arthritis) [facility])     Past Surgical History  Procedure Laterality Date  . Lumbar spine surgery  2005  . Abdominal hysterectomy  2005  . Colon surgery      colostomy then revision  . Hand arthroplasty    . Ankle surgery    . Colonoscopy  04/04/2012    Procedure: COLONOSCOPY;  Surgeon: Rogene Houston, MD;  [facility];  Service: Endoscopy;  Laterality: N/A;  410-Ok per [place]  . Esophagogastroduodenoscopy  04/04/2012    Procedure: ESOPHAGOGASTRODUODENOSCOPY (EGD);  Surgeon: Rogene Houston, MD;  [facility];  Service: Endoscopy;  Laterality: N/A;  possible  . Abdominal surgery      2006 colectomy for diverticulitis    There were no vitals filed for this visit.  Visit Diagnosis:  Posterior tibialis muscle  dysfunction  Left ankle pain  Ankle weakness  Difficulty walking  Unsteadiness      Subjective Assessment - 09/17/15 1306    Subjective Pt entered dept with a roll of her own kinesiotape, states she tried it at home but it came unrolled.  Reports minimal pain today, 1/10 generalized pain for LE and back.  Pt reports most difficulty with walking for long periods in unever ground,.   Pertinent History Has a history of extensive R ankle surgery that was botched initially, second surgery did not correct problem- R leg is shorter due to this structurally. Patient reports that her L foot, which is what she has been referred for, usually feels fine but will occasionally pop and collapse on her, seems to happen more when she is outdoors.  Has an appontment for orthotics for L ankle scheduled.    Patient Stated Goals strengthen ankle    Currently in Pain? Yes   Pain Score 1    Pain Location Generalized   Pain Orientation Right;Left   Pain Descriptors / Indicators Aching;Sore                         [facility] Adult PT Treatment/Exercise - 09/17/15 0001    Knee/Hip Exercises: Standing   Gait Training 6  min walk test 643 feet wiht no AD for 5 minute and SPC for last minute, did required 2-3 seated breaks    Knee/Hip Exercises: Seated   Long Arc Quad Left;15 reps   Long Arc Quad Weight 3 lbs.   Sit to Sand 10 reps;without UE support   Manual Therapy   Manual Therapy Taping   Manual therapy comments Pt. brought her own tape to session, verbal cueing for placement, pt. completed indepdently following cueing   Ankle Exercises: Seated   Towel Inversion/Eversion --   Heel Raises 20 reps;Other (comment)  4#   Toe Raise 20 reps;Other (comment)  4#   Other Seated Ankle Exercises pen coin 1x20    Other Seated Ankle Exercises inversion/eversion blue TB 1x20                  PT Short Term Goals - 09/01/15 1336    PT SHORT TERM GOAL #1   Title Patient to be able to perform  TUG balance test in 12 seconds or less with cane in order to demonstrate reduced fall risk and improved overall mobility    Baseline 1/25- 11 seconds without cane    Time 3   Period Weeks   Status Achieved   PT SHORT TERM GOAL #2   Title Patient will demosntrate improved overall gait mechanics with improved ability to bear weight equally, improved posture during gait, improved gait speed, and improved heel-toe pattern in order to promote improved efficiency with mobiltiy    Time 3   Period Weeks   Status On-going   PT SHORT TERM GOAL #3   Title Patient will be able to verbally describe the importance of proper foot wear and bracing/orthotics in order to enhance patient's self-efficacy and knowledge in managing condition    Time 3   Period Weeks   Status Achieved   PT SHORT TERM GOAL #4   Title Patient will be independent in correctly and consistently perform appropraite HEP, to be updated PRN    Baseline 1/25- doing every day    Time 3   Period Weeks   Status Achieved   PT SHORT TERM GOAL #5   Title Patient to be independent in correctly applying kinesiotape to affected region in order to assist in increasing regional stability and enhancing independence in managing condition    Time 3   Period Weeks   Status New           PT Long Term Goals - 09/01/15 1339    PT LONG TERM GOAL #1   Title Patient will demonstrate 5/5 strength in all tested muscles in order to reduce chance of collapse and improve overall mobility    Time 6   Period Weeks   Status On-going   PT LONG TERM GOAL #2   Title Patient will be able to tolerate performing 6 minute walk test, with cane and unlimited rest breaks, in order to demonstrate improved overall endurance and mobility    Baseline 1/25- limited by back today   Time 6   Period Weeks   Status On-going   PT LONG TERM GOAL #3   Title Patient to be able to tolerate shopping in grocery store with cane, no ankle collapse, and minimal unsteadiness in  order to assist in improving her overall function and community participation    Baseline 1/25- has been doing better with this    Time 6   Period Weeks   Status On-going   PT LONG  TERM GOAL #4   Title Patient will report no collapse of L ankle within the past 3 weeks in order to reduce overall fall risk, potential risk of damage to tissues, and to improve overall mobility    Baseline 1/25- no collapses in past 4 weeks    Time 6   Period Weeks   Status Achieved               Plan - 09/17/15 1327    Clinical Impression Statement Reviewed and instructed technique with kinesiotaping to complete at home, pt able to verbalize and demonstrate appropriate technique.  Kinesiotaping taping complete this initially this session to facilitate posterior tib for strengthening exercises through session.  Minimal therapist facilitation required for all exercises this session, able to increase reps and weight with seated exercises.  Ended session with 6 minute walk to assess gait mechanics and tolerance, able to walk for 5 minutes without SPC then utilized cane for last minute to improve gait mechanics, able to ambulate for 643 feet wtih minimal cueing required for equal stance phase and posture.  End of sessoin no reports of increased pain, was fatigued with activity.   PT Next Visit Plan Reassess in 2 more sessions.  Continue PT POC with focus on strength, balance and gait. Instruct kinesiotape application to add to HEP.  Hold manual due to laxity, not appropraite.  Conservative approach with pt in terms of types of activities due to easy aggravation of back pain.          Problem List Patient Active Problem List   Diagnosis Date Noted  . Osteopenia 02/16/2014  . Hyperlipidemia 04/22/2013  . Hypothyroidism 04/22/2013  . GERD (gastroesophageal reflux disease) 01/01/2013  . Rheumatoid arthritis ([place]) 12/20/2012  . Essential hypertension, benign 12/20/2012  . Chronic pain syndrome 12/20/2012    Ihor Austin, [place]; Melissa  Aldona Lento 09/17/2015, 2:20 PM  [place], [place], [address] Phone: [phone]   Fax:  [phone]  Name: Sabrina Adams MRN: [id number] Date of Birth: Jan 18, 1946

## 2015-09-20 ENCOUNTER — Encounter (HOSPITAL_COMMUNITY): Payer: Medicare Other | Admitting: Physical Therapy

## 2015-09-21 ENCOUNTER — Ambulatory Visit (HOSPITAL_COMMUNITY): Payer: Medicare Other

## 2015-09-21 DIAGNOSIS — M76829 Posterior tibial tendinitis, unspecified leg: Secondary | ICD-10-CM

## 2015-09-21 DIAGNOSIS — M25572 Pain in left ankle and joints of left foot: Secondary | ICD-10-CM | POA: Diagnosis not present

## 2015-09-21 DIAGNOSIS — R262 Difficulty in walking, not elsewhere classified: Secondary | ICD-10-CM | POA: Diagnosis not present

## 2015-09-21 DIAGNOSIS — R2681 Unsteadiness on feet: Secondary | ICD-10-CM

## 2015-09-21 DIAGNOSIS — R29898 Other symptoms and signs involving the musculoskeletal system: Secondary | ICD-10-CM

## 2015-09-21 DIAGNOSIS — M259 Joint disorder, unspecified: Secondary | ICD-10-CM | POA: Diagnosis not present

## 2015-09-21 DIAGNOSIS — M629 Disorder of muscle, unspecified: Secondary | ICD-10-CM | POA: Diagnosis not present

## 2015-09-21 NOTE — Therapy (Signed)
[place], [place], [address] Phone: [phone]   Fax:  [phone]  Physical Therapy Treatment  Patient Details  Name: Sabrina Adams MRN: [id number] Date of Birth: May 01, 1946 Referring Provider: Mechele Claude [facility]   Encounter Date: 09/21/2015      PT End of Session - 09/21/15 1659    Visit Number 15   Number of Visits 16   Date for PT Re-Evaluation 09/29/15   Authorization Type Medicare/AARP UHC supplemental plan f    Authorization Time Period 08/03/15 to 10/04/15   Authorization - Visit Number 16   Authorization - Number of Visits 19   PT Start Time [id number]   PT Stop Time 1728   PT Time Calculation (min) 38 min   Activity Tolerance Patient tolerated treatment well   Behavior During Therapy [facility] for tasks assessed/performed      Past Medical History  Diagnosis Date  . Complication of anesthesia     nausea ,  psycotics episodes  . Hypertension   . Hypothyroidism   . Sleep apnea   . GERD (gastroesophageal reflux disease)   . Neuromuscular disorder (HCC)     neuropathy  legs  . Arthritis     rheumatoid  . Anemia   . Stroke (HCC)     mild  . RA (rheumatoid arthritis) [facility])     Past Surgical History  Procedure Laterality Date  . Lumbar spine surgery  2005  . Abdominal hysterectomy  2005  . Colon surgery      colostomy then revision  . Hand arthroplasty    . Ankle surgery    . Colonoscopy  04/04/2012    Procedure: COLONOSCOPY;  Surgeon: Rogene Houston, MD;  [facility];  Service: Endoscopy;  Laterality: N/A;  410-Ok per [place]  . Esophagogastroduodenoscopy  04/04/2012    Procedure: ESOPHAGOGASTRODUODENOSCOPY (EGD);  Surgeon: Rogene Houston, MD;  Location: [facility];  Service: Endoscopy;  Laterality: N/A;  possible  . Abdominal surgery      2006 colectomy for diverticulitis    There were no vitals filed for this visit.  Visit Diagnosis:  Posterior tibialis muscle  dysfunction  Left ankle pain  Ankle weakness  Difficulty walking  Unsteadiness      Subjective Assessment - 09/21/15 1653    Subjective Pt. entered dept with kinesiotape on, pain minimal today 1/10 Lt ankle   Pertinent History Has a history of extensive R ankle surgery that was botched initially, second surgery did not correct problem- R leg is shorter due to this structurally. Patient reports that her L foot, which is what she has been referred for, usually feels fine but will occasionally pop and collapse on her, seems to happen more when she is outdoors.  Has an appontment for orthotics for L ankle scheduled.    Patient Stated Goals strengthen ankle    Currently in Pain? Yes   Pain Score 1    Pain Location Ankle   Pain Orientation Left;Right   Pain Descriptors / Indicators Dull   Pain Type Chronic pain   Pain Onset More than a month ago   Pain Frequency Intermittent   Aggravating Factors  moving the wrong way makes it worse                   [facility] Adult PT Treatment/Exercise - 09/21/15 0001    Knee/Hip Exercises: Standing   Stairs ascend 7in and descend 4in step height 1 HR,  1 RT   Rocker Board 2 minutes   Rocker Board Limitations R/L   Gait Training 6 min walk test 750 ft with SPC no rest breaks required today   Knee/Hip Exercises: Seated   Long Arc Quad Left;15 reps   Long Arc Quad Weight 3 lbs.   Sit to Sand 10 reps;without UE support   Ankle Exercises: Seated   Heel Raises 20 reps;Other (comment)  4#   Toe Raise 20 reps;Other (comment)  4#   Other Seated Ankle Exercises pen coin 1x20    Other Seated Ankle Exercises inversion/eversion blue TB 1x20   Ankle Exercises: Standing   Rocker Board Limitations 2 min L/R              PT Short Term Goals - 09/01/15 1336    PT SHORT TERM GOAL #1   Title Patient to be able to perform TUG balance test in 12 seconds or less with cane in order to demonstrate reduced fall risk and improved overall mobility     Baseline 1/25- 11 seconds without cane    Time 3   Period Weeks   Status Achieved   PT SHORT TERM GOAL #2   Title Patient will demosntrate improved overall gait mechanics with improved ability to bear weight equally, improved posture during gait, improved gait speed, and improved heel-toe pattern in order to promote improved efficiency with mobiltiy    Time 3   Period Weeks   Status On-going   PT SHORT TERM GOAL #3   Title Patient will be able to verbally describe the importance of proper foot wear and bracing/orthotics in order to enhance patient's self-efficacy and knowledge in managing condition    Time 3   Period Weeks   Status Achieved   PT SHORT TERM GOAL #4   Title Patient will be independent in correctly and consistently perform appropraite HEP, to be updated PRN    Baseline 1/25- doing every day    Time 3   Period Weeks   Status Achieved   PT SHORT TERM GOAL #5   Title Patient to be independent in correctly applying kinesiotape to affected region in order to assist in increasing regional stability and enhancing independence in managing condition    Time 3   Period Weeks   Status New           PT Long Term Goals - 09/01/15 1339    PT LONG TERM GOAL #1   Title Patient will demonstrate 5/5 strength in all tested muscles in order to reduce chance of collapse and improve overall mobility    Time 6   Period Weeks   Status On-going   PT LONG TERM GOAL #2   Title Patient will be able to tolerate performing 6 minute walk test, with cane and unlimited rest breaks, in order to demonstrate improved overall endurance and mobility    Baseline 1/25- limited by back today   Time 6   Period Weeks   Status On-going   PT LONG TERM GOAL #3   Title Patient to be able to tolerate shopping in grocery store with cane, no ankle collapse, and minimal unsteadiness in order to assist in improving her overall function and community participation    Baseline 1/25- has been doing better with  this    Time 6   Period Weeks   Status On-going   PT LONG TERM GOAL #4   Title Patient will report no collapse of L ankle within the past  3 weeks in order to reduce overall fall risk, potential risk of damage to tissues, and to improve overall mobility    Baseline 1/25- no collapses in past 4 weeks    Time 6   Period Weeks   Status Achieved               Plan - 09/21/15 1729    Clinical Impression Statement Pt came into session with kinesiotape correctly applied, no therapist intervention required for placement.  Pt improving activitiy tolerance with gait training this session with ability to ambulate 750 ft with SPC (was 643 feet last session) and no rest breaks required.  Introduced IT trainer as Facilities manager with abiltiy to ambulate reciprocal pattern  with safe mechanics  Pt demonstrated improved balance as well this session with min guard required for activiites on dynamic surface.  No reports of increased pain through session.     PT Next Visit Plan Reassess next session.          Problem List Patient Active Problem List   Diagnosis Date Noted  . Osteopenia 02/16/2014  . Hyperlipidemia 04/22/2013  . Hypothyroidism 04/22/2013  . GERD (gastroesophageal reflux disease) 01/01/2013  . Rheumatoid arthritis ([place]) 12/20/2012  . Essential hypertension, benign 12/20/2012  . Chronic pain syndrome 12/20/2012   Ihor Austin, LPTA; [place]  Aldona Lento 09/21/2015, 5:37 PM  [place] [address] [place], [place], [address] Phone: [phone]   Fax:  [phone]  Name: Sabrina Adams MRN: [id number] Date of Birth: 1946/01/01

## 2015-09-22 ENCOUNTER — Encounter (HOSPITAL_COMMUNITY): Payer: Medicare Other | Admitting: Physical Therapy

## 2015-09-23 ENCOUNTER — Ambulatory Visit (HOSPITAL_COMMUNITY): Payer: Medicare Other | Admitting: Physical Therapy

## 2015-09-23 DIAGNOSIS — R262 Difficulty in walking, not elsewhere classified: Secondary | ICD-10-CM | POA: Diagnosis not present

## 2015-09-23 DIAGNOSIS — R29898 Other symptoms and signs involving the musculoskeletal system: Secondary | ICD-10-CM

## 2015-09-23 DIAGNOSIS — M259 Joint disorder, unspecified: Secondary | ICD-10-CM | POA: Diagnosis not present

## 2015-09-23 DIAGNOSIS — R2681 Unsteadiness on feet: Secondary | ICD-10-CM

## 2015-09-23 DIAGNOSIS — M76829 Posterior tibial tendinitis, unspecified leg: Secondary | ICD-10-CM

## 2015-09-23 DIAGNOSIS — M25572 Pain in left ankle and joints of left foot: Secondary | ICD-10-CM | POA: Diagnosis not present

## 2015-09-23 DIAGNOSIS — M629 Disorder of muscle, unspecified: Secondary | ICD-10-CM | POA: Diagnosis not present

## 2015-09-23 NOTE — Therapy (Signed)
[place] [address] [place], [place], [address] Phone: [phone]   Fax:  [phone]  Physical Therapy Treatment (Discharge)  Patient Details  Name: Sabrina Adams MRN: [id number] Date of Birth: Sep 27, 1945 Referring Provider: Mechele Claude [facility]   Encounter Date: 09/23/2015      PT End of Session - 09/23/15 1435    Visit Number 16   Number of Visits 16   Authorization Type Medicare/[employer] UHC supplemental plan f    Authorization Time Period 08/03/15 to 10/04/15   Authorization - Visit Number 17   Authorization - Number of Visits 19   PT Start Time 1350   PT Stop Time 1428   PT Time Calculation (min) 38 min   Activity Tolerance Patient tolerated treatment well   Behavior During Therapy [facility] for tasks assessed/performed      Past Medical History  Diagnosis Date  . Complication of anesthesia     nausea ,  psycotics episodes  . Hypertension   . Hypothyroidism   . Sleep apnea   . GERD (gastroesophageal reflux disease)   . Neuromuscular disorder (HCC)     neuropathy  legs  . Arthritis     rheumatoid  . Anemia   . Stroke (HCC)     mild  . RA (rheumatoid arthritis) [facility])     Past Surgical History  Procedure Laterality Date  . Lumbar spine surgery  2005  . Abdominal hysterectomy  2005  . Colon surgery      colostomy then revision  . Hand arthroplasty    . Ankle surgery    . Colonoscopy  04/04/2012    Procedure: COLONOSCOPY;  Surgeon: Rogene Houston, MD;  [facility];  Service: Endoscopy;  Laterality: N/A;  410-Ok per [place]  . Esophagogastroduodenoscopy  04/04/2012    Procedure: ESOPHAGOGASTRODUODENOSCOPY (EGD);  Surgeon: Rogene Houston, MD;  [facility];  Service: Endoscopy;  Laterality: N/A;  possible  . Abdominal surgery      2006 colectomy for diverticulitis    There were no vitals filed for this visit.  Visit Diagnosis:  Posterior tibialis muscle dysfunction  Left ankle pain  Ankle  weakness  Difficulty walking  Unsteadiness      Subjective Assessment - 09/23/15 1353    Subjective Pateitn reports taht she definitely has gotten better since starting PT; can walk further and can stand longer, is independent in kinesiotaping, is more aware of and activie in posture. Can't walk as far as she'd like to but her back is a factor in this as well.    Pertinent History Has a history of extensive R ankle surgery that was botched initially, second surgery did not correct problem- R leg is shorter due to this structurally. Patient reports that her L foot, which is what she has been referred for, usually feels fine but will occasionally pop and collapse on her, seems to happen more when she is outdoors.  Has an appontment for orthotics for L ankle scheduled.    How long can you stand comfortably? 2/16- can stand to cook a meal, amybe 15 minutes    How long can you walk comfortably? 2/16- unable to give specific number but improving    Patient Stated Goals strengthen ankle    Currently in Pain? Yes   Pain Score 1    Pain Location Back   Pain Orientation Lower   Pain Descriptors / Indicators Dull;Cramping   Pain Type Chronic pain  Pain Onset More than a month ago   Pain Frequency Constant   Aggravating Factors  being up on it a long time    Pain Relieving Factors napping    Multiple Pain Sites No            [facility] PT Assessment - 09/23/15 0001    Observation/Other Assessments   Focus on Therapeutic Outcomes (FOTO)  57% limited    AROM   Left Ankle Dorsiflexion 9   Left Ankle Plantar Flexion --  wfl    Left Ankle Inversion 20   Left Ankle Eversion 39   Strength   Right Hip Flexion 4-/5   Left Hip Flexion 4-/5   Right Knee Flexion 4-/5   Right Knee Extension 4+/5   Left Knee Flexion 3-/5   Left Knee Extension 4+/5   Left Ankle Dorsiflexion 5/5   Left Ankle Inversion 5/5   Left Ankle Eversion 4+/5   6 minute walk test results    Aerobic Endurance Distance Walked  678   Endurance additional comments 6MWT, cane; 1 rest break    High Level Balance   High Level Balance Comments TUG 12.6 without cane                              PT Education - 09/23/15 1435    Education provided Yes   Education Details keep up with current plan for HEP; use of therband to increase resistance for HEP; keep up with kinesiotape    Person(s) Educated Patient   Methods Explanation   Comprehension Verbalized understanding          PT Short Term Goals - 09/23/15 1419    PT SHORT TERM GOAL #1   Title Patient to be able to perform TUG balance test in 12 seconds or less with cane in order to demonstrate reduced fall risk and improved overall mobility    Time 3   Period Weeks   Status Achieved   PT SHORT TERM GOAL #2   Title Patient will demosntrate improved overall gait mechanics with improved ability to bear weight equally, improved posture during gait, improved gait speed, and improved heel-toe pattern in order to promote improved efficiency with mobiltiy    Baseline 2/16- improved but still lakcing; patient reports she feels some limitation might be from shoe being too tall    Time 3   Period Weeks   Status Partially Met   PT SHORT TERM GOAL #3   Title Patient will be able to verbally describe the importance of proper foot wear and bracing/orthotics in order to enhance patient's self-efficacy and knowledge in managing condition    Time 3   Period Weeks   Status Achieved   PT SHORT TERM GOAL #4   Title Patient will be independent in correctly and consistently perform appropraite HEP, to be updated PRN    Time 3   Period Weeks   Status Achieved   PT SHORT TERM GOAL #5   Title Patient to be independent in correctly applying kinesiotape to affected region in order to assist in increasing regional stability and enhancing independence in managing condition    Time 3   Period Weeks   Status Achieved           PT Long Term Goals - 09/23/15  1421    PT LONG TERM GOAL #1   Title Patient will demonstrate 5/5 strength in all tested muscles in order  to reduce chance of collapse and improve overall mobility    Time 6   Period Weeks   Status On-going   PT LONG TERM GOAL #2   Title Patient will be able to tolerate performing 6 minute walk test, with cane and unlimited rest breaks, in order to demonstrate improved overall endurance and mobility    Baseline 09-27-22- has done today    Time 6   Period Weeks   Status Achieved   PT LONG TERM GOAL #3   Title Patient to be able to tolerate shopping in grocery store with cane, no ankle collapse, and minimal unsteadiness in order to assist in improving her overall function and community participation    Baseline 09-27-2022- has been doing better, has tried going for short trips    Time 6   Period Weeks   Status On-going   PT LONG TERM GOAL #4   Title Patient will report no collapse of L ankle within the past 3 weeks in order to reduce overall fall risk, potential risk of damage to tissues, and to improve overall mobility    Baseline 27-Sep-2022- one collapse when she went around corner the other day but no others that she can recall    Time 6   Period Weeks   Status Partially Met               Plan - 09/28/15 1441    Clinical Impression Statement Re-assessment performed today. Upon examination, patient does show improvment in strength and gait mechancis/tolerance, however she does continue to be limited in terms of howe long she can stand and ambulate as well as her functional activity tolerance- however realize that her back and other long standing orthopedic issues are also influencing this. Patient is independent in kinesiotaping and  has no further qeustions regarding this. At this time patient appears ready for termination of skilled PT services as she has done very well overall and is currently independent in managing her condition appropriately.    Pt will benefit from skilled therapeutic  intervention in order to improve on the following deficits Abnormal gait;Decreased endurance;Hypomobility;Decreased activity tolerance;Decreased strength;Pain;Difficulty walking;Decreased mobility;Decreased balance;Improper body mechanics;Decreased coordination;Impaired flexibility;Postural dysfunction   Rehab Potential Fair   Clinical Impairments Affecting Rehab Potential chronicity of problem; multiple medical and body structural factors (R foot/ankle, scoliosis, profound weakness, limited ability to participate in activity) contributing to issue    PT Next Visit Plan DC today    PT Home Exercise Plan given    Consulted and Agree with Plan of Care Patient          G-Codes - Sep 28, 2015 1445    Functional Assessment Tool Used FOTO 57% limited    Functional Limitation Mobility: Walking and moving around   Mobility: Walking and Moving Around Goal Status [phone]) At least 40 percent but less than 60 percent impaired, limited or restricted   Mobility: Walking and Moving Around Discharge Status [phone]) At least 40 percent but less than 60 percent impaired, limited or restricted      Problem List Patient Active Problem List   Diagnosis Date Noted  . Osteopenia 02/16/2014  . Hyperlipidemia 04/22/2013  . Hypothyroidism 04/22/2013  . GERD (gastroesophageal reflux disease) 01/01/2013  . Rheumatoid arthritis ([place]) 12/20/2012  . Essential hypertension, benign 12/20/2012  . Chronic pain syndrome 12/20/2012   PHYSICAL THERAPY DISCHARGE SUMMARY  Visits from Start of Care: 16  Current functional level related to goals / functional outcomes: Patient has been very adherent to skilled  PT services and has regularly attended; patient has shown good improvement and has become failrly independent in managing her condition with appropriate HEP and kinesiotaping. She appears ready for and is in agreement with DC today.    Remaining deficits: Muscle weakness, limited functional activity tolerance,  unsteadiness, imparied gait mechanics    Education / Equipment: HEP, therband, kinesiotaping  Plan: Patient agrees to discharge.  Patient goals were partially met. Patient is being discharged due to being pleased with the current functional level.  ?????       Deniece Ree PT, DPT [place] [address] [place], [place], [address] Phone: [phone]   Fax:  [phone]  Name: Sabrina Adams MRN: [id number] Date of Birth: 1945/12/29

## 2015-10-31 ENCOUNTER — Other Ambulatory Visit: Payer: Self-pay | Admitting: Family Medicine

## 2015-11-15 ENCOUNTER — Encounter: Payer: Self-pay | Admitting: Family Medicine

## 2015-11-15 ENCOUNTER — Ambulatory Visit (INDEPENDENT_AMBULATORY_CARE_PROVIDER_SITE_OTHER): Payer: Medicare Other | Admitting: Family Medicine

## 2015-11-15 VITALS — BP 124/72 | Ht 65.0 in | Wt 173.0 lb

## 2015-11-15 DIAGNOSIS — I10 Essential (primary) hypertension: Secondary | ICD-10-CM

## 2015-11-15 DIAGNOSIS — Z23 Encounter for immunization: Secondary | ICD-10-CM

## 2015-11-15 DIAGNOSIS — H6992 Unspecified Eustachian tube disorder, left ear: Secondary | ICD-10-CM

## 2015-11-15 DIAGNOSIS — R739 Hyperglycemia, unspecified: Secondary | ICD-10-CM

## 2015-11-15 DIAGNOSIS — E038 Other specified hypothyroidism: Secondary | ICD-10-CM

## 2015-11-15 DIAGNOSIS — H6982 Other specified disorders of Eustachian tube, left ear: Secondary | ICD-10-CM

## 2015-11-15 DIAGNOSIS — E785 Hyperlipidemia, unspecified: Secondary | ICD-10-CM

## 2015-11-15 MED ORDER — ESOMEPRAZOLE MAGNESIUM 40 MG PO CPDR: 40.0000 mg | DELAYED_RELEASE_CAPSULE | Freq: Every day | ORAL | Status: DC

## 2015-11-15 NOTE — Progress Notes (Signed)
   Subjective:    Patient ID: Sabrina Adams, female    DOB: 11/25/1945, 70 y.o.   MRN: [id number]  Hyperlipidemia This is a chronic problem. The current episode started more than 1 year ago. Pertinent negatives include no chest pain. There are no compliance problems.   Patient does try to watch her diet as best as possible She does have rheumatoid arthritis has to take pain medicine occasionally but hasn't had to take any in months She has reflux which the Nexium does well she needs refills on it we did talk about side effects of medication. Blood pressure medicine she takes on a regular basis watch his diet. We did discuss treatment options Patient also states left ear is full pressure feeling been that way for about a week we discussed no head congestion drainage coughing sneezing Hypothyroidism takes her medicine on a regular basis energy level doing okay Patient has concerns of left ear fullness.   Review of Systems  Constitutional: Negative for activity change, appetite change and fatigue.  HENT: Negative for congestion.   Respiratory: Negative for cough.   Cardiovascular: Negative for chest pain.  Gastrointestinal: Negative for abdominal pain.  Endocrine: Negative for polydipsia and polyphagia.  Neurological: Negative for weakness.  Psychiatric/Behavioral: Negative for confusion.       Objective:   Physical Exam  Constitutional: She appears well-nourished. No distress.  Cardiovascular: Normal rate, regular rhythm and normal heart sounds.   No murmur heard. Pulmonary/Chest: Effort normal and breath sounds normal. No respiratory distress.  Musculoskeletal: She exhibits no edema.  Lymphadenopathy:    She has no cervical adenopathy.  Neurological: She is alert. She exhibits normal muscle tone.  Psychiatric: Her behavior is normal.  Vitals reviewed.   25 minutes was spent with the patient. Greater than half the time was spent in discussion and answering questions and  counseling regarding the issues that the patient came in for today.       Assessment & Plan:  Eustachian tube dysfunction if it does not get better over the next 2 weeks patient is let us know we will set her up with ENT  Rheumatoid followed by the rheumatologist she uses Dilaudid occasionally for pain she will let us know if she needs a prescription  Hyperlipidemia continue watching diet closely check lab work  Osteoporosis followed by rheumatology patient on IV Reclast yearly  Hypothyroidism continue medication check TSH  Blood pressure good control  Continue blood pressure medicine watch diet follow-up 6 months

## 2015-11-16 DIAGNOSIS — E785 Hyperlipidemia, unspecified: Secondary | ICD-10-CM | POA: Diagnosis not present

## 2015-11-16 DIAGNOSIS — E038 Other specified hypothyroidism: Secondary | ICD-10-CM | POA: Diagnosis not present

## 2015-11-16 DIAGNOSIS — R739 Hyperglycemia, unspecified: Secondary | ICD-10-CM | POA: Diagnosis not present

## 2015-11-17 LAB — LIPID PANEL
CHOL/HDL RATIO: 3.1 ratio (ref 0.0–4.4)
Cholesterol, Total: 229 mg/dL — ABNORMAL HIGH (ref 100–199)
HDL: 73 mg/dL (ref >39)
LDL CALC: 114 mg/dL — AB (ref 0–99)
Triglycerides: 209 mg/dL — ABNORMAL HIGH (ref 0–149)
VLDL Cholesterol Cal: 42 mg/dL — ABNORMAL HIGH (ref 5–40)

## 2015-11-17 LAB — HEMOGLOBIN A1C
Est. average glucose Bld gHb Est-mCnc: 123 mg/dL
HEMOGLOBIN A1C: 5.9 % — AB (ref 4.8–5.6)

## 2015-11-17 LAB — TSH: TSH: 1.47 u[IU]/mL (ref 0.450–4.500)

## 2015-11-19 ENCOUNTER — Ambulatory Visit: Payer: Medicare Other | Admitting: Family Medicine

## 2015-11-23 ENCOUNTER — Ambulatory Visit: Payer: Medicare Other | Admitting: Family Medicine

## 2015-11-30 ENCOUNTER — Encounter (INDEPENDENT_AMBULATORY_CARE_PROVIDER_SITE_OTHER): Payer: Self-pay | Admitting: *Deleted

## 2015-12-02 ENCOUNTER — Other Ambulatory Visit: Payer: Self-pay | Admitting: Family Medicine

## 2015-12-03 ENCOUNTER — Other Ambulatory Visit: Payer: Self-pay | Admitting: Family Medicine

## 2015-12-03 NOTE — Telephone Encounter (Signed)
May refill this and 5 additional refills on each

## 2016-01-18 DIAGNOSIS — H401121 Primary open-angle glaucoma, left eye, mild stage: Secondary | ICD-10-CM | POA: Diagnosis not present

## 2016-01-27 DIAGNOSIS — H401121 Primary open-angle glaucoma, left eye, mild stage: Secondary | ICD-10-CM | POA: Diagnosis not present

## 2016-01-31 ENCOUNTER — Other Ambulatory Visit (INDEPENDENT_AMBULATORY_CARE_PROVIDER_SITE_OTHER): Payer: Self-pay | Admitting: Internal Medicine

## 2016-01-31 ENCOUNTER — Encounter (INDEPENDENT_AMBULATORY_CARE_PROVIDER_SITE_OTHER): Payer: Self-pay | Admitting: Internal Medicine

## 2016-01-31 ENCOUNTER — Ambulatory Visit (INDEPENDENT_AMBULATORY_CARE_PROVIDER_SITE_OTHER): Payer: Medicare Other | Admitting: Internal Medicine

## 2016-01-31 VITALS — BP 112/52 | HR 60 | Temp 98.2°F | Ht 65.0 in | Wt 176.5 lb

## 2016-01-31 DIAGNOSIS — K219 Gastro-esophageal reflux disease without esophagitis: Secondary | ICD-10-CM

## 2016-01-31 DIAGNOSIS — K625 Hemorrhage of anus and rectum: Secondary | ICD-10-CM

## 2016-01-31 NOTE — Progress Notes (Signed)
Subjective:    Patient ID: Sabrina Adams, female    DOB: [birth year], 70 y.o.   MRN: [id number]  HPI Here today for f/u of her chronic GERD. She was last seen in July, 2016. She tells me she is doing good. GERD controlled with Nexium. She tells me she had an episode of diarrhea last week after eating at a restaurant. She said she was up all night.She had multiple stools. She was at [employer]. She ate a chicken sandwich and a frosted coffee. No nausea, vomting or fever. Symptoms occurred suddenly.  She says it began to get bloody looking. She has not had any symptoms since then. No fever. Her husband says she was cold and clammy. Her stools now normal now. She leans toward constipation. She says she needs to take a Laxative now to have a BM which is normal for her.' Her appetite is okay. No weight loss. She has gained about 7 pounds since her last visit.  No NSAIDS Her last colonoscopy was in 2013 for rectal bleeding.     2007: OPERATION: Rectosigmoid colectomy with colostomy and stapling of the distal segment and drainage of the pelvic abscess.   HPI Here today for f/u of her GERD. As long as she takes her Nexium she does good.  She does take extra antacids if she eats pizza or spaghetti Hx of RA. Rt leg in a brace. She usually has a BM and takes MIralax twice a week. She usually has a BM every 3-4 days. No melena or BRRB. No change in her stools. Her appetite is good. No weight loss.       04/04/2012 Colonoscopy  Indications: Patient is 70 year old Caucasian female who had an episode of rectal bleeding 2 weeks ago at that she passed maroon-colored stools. He was evaluated by Dr. looking and noted to have Hgb of 11 g and iron studies consistent with iron deficiency anemia. Patient takes Mobic every day for arthritic symptoms. He has rheumatoid arthritis. Examination performed to cecum. Scattered diverticula throughout the colon. Colorectal anastomosis at 10 cm from  anal margin. Two ulcers at splenic flexure. One ulcer was 4 cm long and the other also was smaller. Biopsy taken for routine histology. Suspect either ischemic injury or NSAID colopathy.  Biopsy results reviewed with patient. Biopsy shows nonspecific ulcers. No no features of inflammatory bowel disease, ischemia or infection. NSAID injury suspected. Patient has already dropped NSAID dose to half.   r.  Two children in good health. Married. Retired Education officer, museum      Review of Systems Past Medical History  Diagnosis Date  . Complication of anesthesia     nausea ,  psycotics episodes  . Hypertension   . Hypothyroidism   . Sleep apnea   . GERD (gastroesophageal reflux disease)   . Neuromuscular disorder (HCC)     neuropathy  legs  . Arthritis     rheumatoid  . Anemia   . Stroke (HCC)     mild  . RA (rheumatoid arthritis) [facility])     Past Surgical History  Procedure Laterality Date  . Lumbar spine surgery  2005  . Abdominal hysterectomy  2005  . Colon surgery      colostomy then revision  . Hand arthroplasty    . Ankle surgery    . Colonoscopy  04/04/2012    Procedure: COLONOSCOPY;  Surgeon: Rogene Houston, MD;  [facility];  Service: Endoscopy;  Laterality: N/A;  410-Ok per [place]  .  Esophagogastroduodenoscopy  04/04/2012    Procedure: ESOPHAGOGASTRODUODENOSCOPY (EGD);  Surgeon: Rogene Houston, MD;  Location: [facility];  Service: Endoscopy;  Laterality: N/A;  possible  . Abdominal surgery      2006 colectomy for diverticulitis    Allergies  Allergen Reactions  . Betadine [Povidone Iodine] Itching    "Burns"  . Cinnamon Anaphylaxis    Throat and Tongue   . Codeine Nausea Only    Patient can become "psychotic"  . Other Nausea And Vomiting and Other (See Comments)    Pt states "all narcotic pain medications make me sick to my stomach and sometimes make me psychotic"  . Actonel [Risedronate Sodium]     stomach  . Adhesive [Tape] Other (See  Comments)    Irritates skin   . Lidocaine   . Methotrexate Derivatives     Elevated liver enzymes    Current Outpatient Prescriptions on File Prior to Visit  Medication Sig Dispense Refill  . acetaminophen (TYLENOL) 500 MG tablet Take 500 mg by mouth 2 (two) times daily.     [name] acetaminophen (TYLENOL) 650 MG CR tablet Take 650 mg by mouth 2 (two) times daily.     [name] amLODipine (NORVASC) 10 MG tablet Take 1 tablet (10 mg total) by mouth daily. 180 tablet 3  . b complex vitamins tablet Take 1 tablet by mouth daily.    . Calcium Carbonate-Vitamin D (CALCIUM 600 + D PO) Take 1 tablet by mouth 2 (two) times daily.    . Cholecalciferol (VITAMIN D3) 5000 UNITS TABS Take by mouth. Once a week    . docusate sodium (COLACE) 100 MG capsule Take 200 mg by mouth at bedtime.    [name] esomeprazole (NEXIUM) 40 MG capsule Take 40 mg by mouth daily before breakfast.    . esomeprazole (NEXIUM) 40 MG capsule Take 1 capsule (40 mg total) by mouth daily. 90 capsule 3  . fluconazole (DIFLUCAN) 150 MG tablet Take 1 tablet (150 mg total) by mouth daily. 3 days apart (Patient not taking: Reported on 11/15/2015) 2 tablet 0  . gabapentin (NEURONTIN) 300 MG capsule TAKE 1 CAPSULE FOUR TIMES DAILY 360 capsule 0  . HYDROmorphone (DILAUDID) 2 MG tablet Take 1 tablet (2 mg total) by mouth every 4 (four) hours as needed. Pain 30 tablet 0  . hydroxychloroquine (PLAQUENIL) 200 MG tablet Take 1 tablet (200 mg total) by mouth 2 (two) times daily. 180 tablet 1  . levothyroxine (SYNTHROID, LEVOTHROID) 112 MCG tablet Take 1 tablet (112 mcg total) by mouth daily. 90 tablet 3  . Multiple Vitamin (MULTIVITAMIN WITH MINERALS) TABS Take 1 tablet by mouth daily.    . mupirocin ointment (BACTROBAN) 2 % Place 1 application into the nose 2 (two) times daily. 30 g 6  . polyethylene glycol (MIRALAX / GLYCOLAX) packet Take 17 g by mouth daily as needed. Constipation    . polyethylene glycol powder (GLYCOLAX/MIRALAX) powder MIX 1 CAPFUL (17 GRAMS)  WITH 8OZ OF WATER OR JUICE DAILY. 527 g 12  . predniSONE (DELTASONE) 2.5 MG tablet Take 7.5 mg by mouth daily with breakfast.    . Probiotic Product (PROBIOTIC DAILY PO) Take by mouth daily.    . promethazine (PHENERGAN) 25 MG tablet TAKE 1/2 TABLET TWICE DAILY AS NEEDED 30 tablet 3  . psyllium (REGULOID) 0.52 G capsule Take 0.52 g by mouth daily.    . temazepam (RESTORIL) 30 MG capsule Take 1 capsule (30 mg total) by mouth at bedtime as needed. Sleep 30  capsule 5  . timolol (TIMOPTIC) 0.5 % ophthalmic solution     . tiZANidine (ZANAFLEX) 2 MG tablet 1 bid prn 180 tablet 3  . traMADol (ULTRAM) 50 MG tablet TAKE 1 TABLET EVERY 6 HOURS AS NEEDED FOR PAIN 120 tablet 5  . Zoledronic Acid (RECLAST IV) Inject into the vein.     No current facility-administered medications on file prior to visit.        Objective:   Physical Exam Blood pressure 112/52, pulse 60, temperature 98.2 F (36.8 C), height 5\' 5"  (1.651 m), weight 176 lb 8 oz (80.06 kg). Alert and oriented. Skin warm and dry. Oral mucosa is moist.   . Sclera anicteric, conjunctivae is pink. Thyroid not enlarged. No cervical lymphadenopathy. Lungs clear. Heart regular rate and rhythm.  Abdomen is soft. Bowel sounds are positive. No hepatomegaly. No abdominal masses felt. No tenderness.  No edema to lower extremities.  Stool brown and bright red blood noted.    Lot [id number] Ex 9/17     Assessment & Plan:  Diarrhea/ rectal bleeding. Will get a CBC today. Diarrhea has resolved.  Stool brown and guaiac positive with Bright red blood. Colonoscopy. The risks and benefits such as perforation, bleeding, and infection were reviewed with the patient and is agreeable. GERD controlled with Nexium.  OV in 1 year.

## 2016-01-31 NOTE — Patient Instructions (Signed)
Colonoscopy.  The risks and benefits such as perforation, bleeding, and infection were reviewed with the patient and is agreeable. 

## 2016-02-01 ENCOUNTER — Other Ambulatory Visit (INDEPENDENT_AMBULATORY_CARE_PROVIDER_SITE_OTHER): Payer: Self-pay | Admitting: *Deleted

## 2016-02-01 ENCOUNTER — Encounter (INDEPENDENT_AMBULATORY_CARE_PROVIDER_SITE_OTHER): Payer: Self-pay | Admitting: *Deleted

## 2016-02-01 LAB — CBC WITH DIFFERENTIAL/PLATELET
BASOS ABS: 0 {cells}/uL (ref 0–200)
BASOS PCT: 0 %
EOS ABS: 0 {cells}/uL — AB (ref 15–500)
Eosinophils Relative: 0 %
HEMATOCRIT: 44.6 % (ref 35.0–45.0)
Hemoglobin: 14.8 g/dL (ref 11.7–15.5)
LYMPHS PCT: 22 %
Lymphs Abs: 1342 cells/uL (ref 850–3900)
MCH: 30.8 pg (ref 27.0–33.0)
MCHC: 33.2 g/dL (ref 32.0–36.0)
MCV: 92.9 fL (ref 80.0–100.0)
MONO ABS: 488 {cells}/uL (ref 200–950)
MONOS PCT: 8 %
MPV: 10.4 fL (ref 7.5–12.5)
NEUTROS PCT: 70 %
Neutro Abs: 4270 cells/uL (ref 1500–7800)
Platelets: 269 10*3/uL (ref 140–400)
RBC: 4.8 MIL/uL (ref 3.80–5.10)
RDW: 13.6 % (ref 11.0–15.0)
WBC: 6.1 10*3/uL (ref 3.8–10.8)

## 2016-02-01 NOTE — Telephone Encounter (Signed)
Patient needs trilyte 

## 2016-02-02 MED ORDER — PEG 3350-KCL-NA BICARB-NACL 420 G PO SOLR: 4000.0000 mL | Freq: Once | ORAL | Status: DC

## 2016-02-15 ENCOUNTER — Other Ambulatory Visit: Payer: Self-pay | Admitting: *Deleted

## 2016-02-15 MED ORDER — PANTOPRAZOLE SODIUM 40 MG PO TBEC: 40.0000 mg | DELAYED_RELEASE_TABLET | Freq: Every day | ORAL | Status: DC

## 2016-03-22 ENCOUNTER — Encounter (HOSPITAL_COMMUNITY): Payer: Self-pay

## 2016-03-23 ENCOUNTER — Encounter (HOSPITAL_COMMUNITY)
Admission: RE | Disposition: A | Discharge status: Home / Self Care | Payer: Self-pay | Source: Ambulatory Visit | Attending: Internal Medicine

## 2016-03-23 ENCOUNTER — Ambulatory Visit (HOSPITAL_COMMUNITY)
Admission: RE | Admit: 2016-03-23 | Discharge: 2016-03-23 | Disposition: A | Discharge status: Home / Self Care | Payer: Medicare Other | Source: Ambulatory Visit | Attending: Internal Medicine | Admitting: Internal Medicine

## 2016-03-23 ENCOUNTER — Encounter (HOSPITAL_COMMUNITY): Payer: Self-pay | Admitting: *Deleted

## 2016-03-23 DIAGNOSIS — Z9049 Acquired absence of other specified parts of digestive tract: Secondary | ICD-10-CM | POA: Diagnosis not present

## 2016-03-23 DIAGNOSIS — Z7952 Long term (current) use of systemic steroids: Secondary | ICD-10-CM | POA: Insufficient documentation

## 2016-03-23 DIAGNOSIS — K219 Gastro-esophageal reflux disease without esophagitis: Secondary | ICD-10-CM | POA: Insufficient documentation

## 2016-03-23 DIAGNOSIS — K625 Hemorrhage of anus and rectum: Secondary | ICD-10-CM | POA: Diagnosis present

## 2016-03-23 DIAGNOSIS — G4730 Sleep apnea, unspecified: Secondary | ICD-10-CM | POA: Diagnosis not present

## 2016-03-23 DIAGNOSIS — M1990 Unspecified osteoarthritis, unspecified site: Secondary | ICD-10-CM | POA: Diagnosis not present

## 2016-03-23 DIAGNOSIS — Z79899 Other long term (current) drug therapy: Secondary | ICD-10-CM | POA: Diagnosis not present

## 2016-03-23 DIAGNOSIS — I10 Essential (primary) hypertension: Secondary | ICD-10-CM | POA: Diagnosis not present

## 2016-03-23 DIAGNOSIS — Z8673 Personal history of transient ischemic attack (TIA), and cerebral infarction without residual deficits: Secondary | ICD-10-CM | POA: Diagnosis not present

## 2016-03-23 DIAGNOSIS — K921 Melena: Secondary | ICD-10-CM | POA: Insufficient documentation

## 2016-03-23 DIAGNOSIS — K5730 Diverticulosis of large intestine without perforation or abscess without bleeding: Secondary | ICD-10-CM | POA: Insufficient documentation

## 2016-03-23 DIAGNOSIS — E039 Hypothyroidism, unspecified: Secondary | ICD-10-CM | POA: Diagnosis not present

## 2016-03-23 DIAGNOSIS — K644 Residual hemorrhoidal skin tags: Secondary | ICD-10-CM | POA: Insufficient documentation

## 2016-03-23 DIAGNOSIS — D122 Benign neoplasm of ascending colon: Secondary | ICD-10-CM | POA: Insufficient documentation

## 2016-03-23 DIAGNOSIS — M069 Rheumatoid arthritis, unspecified: Secondary | ICD-10-CM | POA: Insufficient documentation

## 2016-03-23 DIAGNOSIS — K5900 Constipation, unspecified: Secondary | ICD-10-CM | POA: Insufficient documentation

## 2016-03-23 DIAGNOSIS — Z980 Intestinal bypass and anastomosis status: Secondary | ICD-10-CM | POA: Diagnosis not present

## 2016-03-23 HISTORY — DX: Nausea with vomiting, unspecified: Z98.890

## 2016-03-23 HISTORY — DX: Nausea with vomiting, unspecified: R11.2

## 2016-03-23 HISTORY — PX: COLONOSCOPY: SHX5424

## 2016-03-23 SURGERY — COLONOSCOPY
Anesthesia: Moderate Sedation

## 2016-03-23 MED ORDER — MIDAZOLAM HCL 5 MG/5ML IJ SOLN
INTRAMUSCULAR | Status: AC
  Filled 2016-03-23: qty 10

## 2016-03-23 MED ORDER — MIDAZOLAM HCL 5 MG/5ML IJ SOLN
INTRAMUSCULAR | Status: DC | PRN
  Administered 2016-03-23 (×3): 2 mg via INTRAVENOUS
  Administered 2016-03-23: 1 mg via INTRAVENOUS

## 2016-03-23 MED ORDER — SODIUM CHLORIDE 0.9 % IV SOLN
INTRAVENOUS | Status: DC
  Administered 2016-03-23: 1000 mL via INTRAVENOUS

## 2016-03-23 MED ORDER — MEPERIDINE HCL 50 MG/ML IJ SOLN
INTRAMUSCULAR | Status: AC
  Filled 2016-03-23: qty 1

## 2016-03-23 MED ORDER — MEPERIDINE HCL 50 MG/ML IJ SOLN
INTRAMUSCULAR | Status: DC | PRN
  Administered 2016-03-23 (×2): 25 mg via INTRAVENOUS

## 2016-03-23 MED ORDER — SIMETHICONE 40 MG/0.6ML PO SUSP
ORAL | Status: DC | PRN
  Administered 2016-03-23: 11:00:00

## 2016-03-23 NOTE — Op Note (Addendum)
[facility] Patient Name: Sabrina Adams Procedure Date: 03/23/2016 10:49 AM MRN: [id number] Date of Birth: 1945-11-11 Attending MD: Hildred Laser , MD CSN: [id number] Age: 70 Admit Type: Outpatient Procedure:                Colonoscopy Indications:              Hematochezia Providers:                Hildred Laser, MD, Renda Rolls, RN, Isabella Stalling,                            Technician Referring MD:             Elayne Snare Wolfgang Phoenix, MD Medicines:                Meperidine 50 mg IV, Midazolam 7 mg IV Complications:            No immediate complications. Estimated Blood Loss:     Estimated blood loss: none. Procedure:                Pre-Anesthesia Assessment:                           - Prior to the procedure, a History and Physical                            was performed, and patient medications and                            allergies were reviewed. The patient's tolerance of                            previous anesthesia was also reviewed. The risks                            and benefits of the procedure and the sedation                            options and risks were discussed with the patient.                            All questions were answered, and informed consent                            was obtained. Prior Anticoagulants: The patient has                            taken no previous anticoagulant or antiplatelet                            agents. ASA Grade Assessment: III - A patient with                            severe systemic disease. After reviewing the risks  and benefits, the patient was deemed in                            satisfactory condition to undergo the procedure.                           After obtaining informed consent, the colonoscope                            was passed under direct vision. Throughout the                            procedure, the patient's blood pressure, pulse, and                            oxygen  saturations were monitored continuously. The                            [id number]) scope was introduced through                            the anus and advanced to the the cecum, identified                            by appendiceal orifice and ileocecal valve. The                            colonoscopy was performed without difficulty. The                            patient tolerated the procedure well. The quality                            of the bowel preparation was adequate to identify                            polyps. The ileocecal valve, appendiceal orifice,                            and rectum were photographed. Scope In: 11:15:33 AM Scope Out: 11:55:53 AM Scope Withdrawal Time: 0 hours 30 minutes 40 seconds  Total Procedure Duration: 0 hours 40 minutes 20 seconds  Findings:      A few small-mouthed diverticula were found in the sigmoid colon,       descending colon, transverse colon and ascending colon.      A 12 mm polyp was found in the proximal ascending colon. The polyp was       carpet-like and sessile. The polyp was removed with a hot snare.       Resection was complete, and retrieval was complete. To prevent bleeding       after the polypectomy, two hemostatic clips were successfully placed (MR       conditional). There was no bleeding at the end of the procedure.      External hemorrhoids were found during retroflexion. The hemorrhoids  were medium-sized.      There was evidence of a prior end-to-side colo-colonic anastomosis in       the recto-sigmoid colon. This was patent and was characterized by       healthy appearing mucosa. The anastomosis was traversed. Impression:               - Diverticulosis in the sigmoid colon, in the                            descending colon, in the transverse colon and in                            the ascending colon.                           - One 12 mm polyp in the proximal ascending colon,                             removed with a hot snare. Resected and retrieved.                            Clips (MR conditional) were placed.                           - External hemorrhoids. Moderate Sedation:      Moderate (conscious) sedation was administered by the endoscopy nurse       and supervised by the endoscopist. The following parameters were       monitored: oxygen saturation, heart rate, blood pressure, CO2       capnography and response to care. Total physician intraservice time was       42 minutes. Recommendation:           - Patient has a contact number available for                            emergencies. The signs and symptoms of potential                            delayed complications were discussed with the                            patient. Return to normal activities tomorrow.                            Written discharge instructions were provided to the                            patient.                           - High fiber diet today.                           - Continue present medications.                           -  No aspirin, ibuprofen, naproxen, or other                            non-steroidal anti-inflammatory drugs for 7 days                            after polyp removal.                           - Await pathology results.                           - Repeat colonoscopy for surveillance based on                            pathology results. Procedure Code(s):        --- Professional ---                           [phone], Colonoscopy, flexible; with removal of                            tumor(s), polyp(s), or other lesion(s) by snare                            technique                           99152, Moderate sedation services provided by the                            same physician or other qualified health care                            professional performing the diagnostic or                            therapeutic service that the sedation supports,                             requiring the presence of an independent trained                            observer to assist in the monitoring of the                            patient's level of consciousness and physiological                            status; initial 15 minutes of intraservice time,                            patient age 64 years or older                           [id number], Moderate sedation services; each  additional                            15 minutes intraservice time                           99153, Moderate sedation services; each additional                            15 minutes intraservice time Diagnosis Code(s):        --- Professional ---                           K64.4, Residual hemorrhoidal skin tags                           D12.2, Benign neoplasm of ascending colon                           K92.1, Melena (includes Hematochezia)                           K57.30, Diverticulosis of large intestine without                            perforation or abscess without bleeding CPT copyright 2016 American Medical Association. All rights reserved. The codes documented in this report are preliminary and upon coder review may  be revised to meet current compliance requirements. Hildred Laser, MD Hildred Laser, MD 03/23/2016 12:10:29 PM This report has been signed electronically. Number of Addenda: 0

## 2016-03-23 NOTE — H&P (Signed)
Sabrina Adams is Sabrina Adams 70 y.o. female.   Chief Complaint: Patient is here for colonoscopy. HPI: Sabrina Adams is 70 year old Caucasian female with multiple medical problems including prior sigmoid colon resection for complicated diverticulitis presents with history of rectal bleeding occurring off-and-on over the last few weeks but not recently. She is prone to constipation and takes Colace every day and MiraLAX and when necessary basis. She has good appetite and denies weight loss. Hemoglobin 7 weeks ago was 14.8. Last colonoscopy was 4 years ago revealing colonic felt to be due to NSAIDs. Family History is negative for CRC.   Past Medical History:  Diagnosis Date  . Anemia   . Arthritis    rheumatoid  . Complication of anesthesia    nausea ,  psycotics episodes  . GERD (gastroesophageal reflux disease)   . Hypertension   . Hypothyroidism   . Neuromuscular disorder (HCC)    neuropathy  legs  . PONV (postoperative nausea and vomiting)   . RA (rheumatoid arthritis) ([place])   . Sleep apnea   . Stroke [facility])    mild    Past Surgical History:  Procedure Laterality Date  . ABDOMINAL HYSTERECTOMY  2005  . ABDOMINAL SURGERY     2006 colectomy for diverticulitis  . ANKLE SURGERY    . COLON SURGERY     colostomy then revision  . COLONOSCOPY  04/04/2012   Procedure: COLONOSCOPY;  Surgeon: Rogene Houston, MD;  [facility];  Service: Endoscopy;  Laterality: N/A;  410-Ok per [place]  . ESOPHAGOGASTRODUODENOSCOPY  04/04/2012   Procedure: ESOPHAGOGASTRODUODENOSCOPY (EGD);  Surgeon: Rogene Houston, MD;  Location[facility];  Service: Endoscopy;  Laterality: N/A;  possible  . HAND ARTHROPLASTY    . LUMBAR SPINE SURGERY  2005  . TONSILLECTOMY      Family History  Problem Relation Age of Onset  . Hypertension Mother   . Hypertension Father   . Heart attack Father   . Heart disease Father    Social History:  reports that she has never smoked. She has never used smokeless tobacco. She  reports that she drinks alcohol. She reports that she does not use drugs.  Allergies:  Allergies  Allergen Reactions  . Betadine [Povidone Iodine] Itching    "Burns"  . Cinnamon Anaphylaxis    Throat and Tongue   . Codeine Nausea Only    Patient can become "psychotic"  . Other Nausea And Vomiting and Other (See Comments)    Pt states "all narcotic pain medications make me sick to my stomach and sometimes make me psychotic"  . Actonel [Risedronate Sodium]     stomach  . Adhesive [Tape] Other (See Comments)    Irritates skin   . Lidocaine   . Methotrexate Derivatives     Elevated liver enzymes    Medications Prior to Admission  Medication Sig Dispense Refill  . acetaminophen (TYLENOL) 650 MG CR tablet Take 650 mg by mouth 2 (two) times daily.     [name] amLODipine (NORVASC) 10 MG tablet Take 1 tablet (10 mg total) by mouth daily. 180 tablet 3  . b complex vitamins tablet Take 1 tablet by mouth daily.    . Calcium Carbonate-Vitamin D (CALCIUM 600 + D PO) Take 1 tablet by mouth 2 (two) times daily.    . Cholecalciferol (VITAMIN D3) 5000 UNITS TABS Take by mouth. Once a week    . docusate sodium (COLACE) 100 MG capsule Take 200 mg by mouth at bedtime.    [name]  gabapentin (NEURONTIN) 300 MG capsule TAKE 1 CAPSULE FOUR TIMES DAILY 360 capsule 0  . HYDROmorphone (DILAUDID) 2 MG tablet Take 1 tablet (2 mg total) by mouth every 4 (four) hours as needed. Pain 30 tablet 0  . hydroxychloroquine (PLAQUENIL) 200 MG tablet Take 1 tablet (200 mg total) by mouth 2 (two) times daily. 180 tablet 1  . levothyroxine (SYNTHROID, LEVOTHROID) 112 MCG tablet Take 1 tablet (112 mcg total) by mouth daily. 90 tablet 3  . Multiple Vitamin (MULTIVITAMIN WITH MINERALS) TABS Take 1 tablet by mouth daily.    . pantoprazole (PROTONIX) 40 MG tablet Take 1 tablet (40 mg total) by mouth daily. 90 tablet 3  . polyethylene glycol (MIRALAX / GLYCOLAX) packet Take 17 g by mouth daily as needed. Constipation    . polyethylene  glycol-electrolytes (TRILYTE) 420 g solution Take 4,000 mLs by mouth once. 4000 mL 0  . predniSONE (DELTASONE) 2.5 MG tablet Take 7.5 mg by mouth daily with breakfast.    . Probiotic Product (PROBIOTIC DAILY PO) Take by mouth daily.    . promethazine (PHENERGAN) 25 MG tablet TAKE 1/2 TABLET TWICE DAILY AS NEEDED 30 tablet 3  . psyllium (REGULOID) 0.52 G capsule Take 0.52 g by mouth daily.    . timolol (TIMOPTIC) 0.5 % ophthalmic solution     . tiZANidine (ZANAFLEX) 2 MG tablet 1 bid prn 180 tablet 3  . traMADol (ULTRAM) 50 MG tablet TAKE 1 TABLET EVERY 6 HOURS AS NEEDED FOR PAIN 120 tablet 5  . acetaminophen (TYLENOL) 500 MG tablet Take 500 mg by mouth 2 (two) times daily.     [name] esomeprazole (NEXIUM) 40 MG capsule Take 40 mg by mouth daily before breakfast.    . esomeprazole (NEXIUM) 40 MG capsule Take 1 capsule (40 mg total) by mouth daily. 90 capsule 3  . fluconazole (DIFLUCAN) 150 MG tablet Take 1 tablet (150 mg total) by mouth daily. 3 days apart (Patient not taking: Reported on 11/15/2015) 2 tablet 0  . mupirocin ointment (BACTROBAN) 2 % Place 1 application into the nose 2 (two) times daily. 30 g 6  . temazepam (RESTORIL) 30 MG capsule Take 1 capsule (30 mg total) by mouth at bedtime as needed. Sleep 30 capsule 5  . Zoledronic Acid (RECLAST IV) Inject into the vein. yearly      No results found for this or any previous visit (from the past 48 hour(s)). No results found.  ROS  Blood pressure (!) 138/57, pulse 72, temperature 98.5 F (36.9 C), temperature source Oral, resp. rate 15, height 5\' 6"  (1.676 m), weight 176 lb (79.8 kg), SpO2 99 %. Physical Exam  Constitutional: She appears well-developed and well-nourished.  HENT:  Mouth/Throat: Oropharynx is clear and moist.  Mild exopthalmos  Eyes: Conjunctivae are normal. No scleral icterus.  Neck: No thyromegaly present.  Cardiovascular: Normal rate and regular rhythm.   Murmur (faint systolic murmur best heard at left sternal  border.) heard. Respiratory: Effort normal and breath sounds normal.  GI: Soft. She exhibits no distension and no mass. There is no tenderness.  Musculoskeletal: She exhibits no edema.  Lymphadenopathy:    She has no cervical adenopathy.  Neurological: She is alert.  Skin: Skin is warm and dry.     Assessment/Plan Rectal bleeding. Diagnostic colonoscopy.  Hildred Laser, MD 03/23/2016, 11:07 AM

## 2016-03-23 NOTE — Discharge Instructions (Signed)
No aspirin or NSAIDs for 1 week. Resume usual medications and high fiber diet. No driving for 24 hours. Physician will call with biopsy results. No aspirin and NSAIDS for 1 week.     Colonoscopy, Care After These instructions give you information on caring for yourself after your procedure. Your doctor may also give you more specific instructions. Call your doctor if you have any problems or questions after your procedure. HOME CARE  Do not drive for 24 hours.  Do not sign important papers or use machinery for 24 hours.  You may shower.  You may go back to your usual activities, but go slower for the first 24 hours.  Take rest breaks often during the first 24 hours.  Walk around or use warm packs on your belly (abdomen) if you have belly cramping or gas.  Drink enough fluids to keep your pee (urine) clear or pale yellow.  Resume your normal diet. Avoid heavy or fried foods.  Avoid drinking alcohol for 24 hours or as told by your doctor.  Only take medicines as told by your doctor. If a tissue sample (biopsy) was taken during the procedure:   Do not take aspirin or blood thinners for 7 days, or as told by your doctor.  Do not drink alcohol for 7 days, or as told by your doctor.  Eat soft foods for the first 24 hours. GET HELP IF: You still have a small amount of blood in your poop (stool) 2-3 days after the procedure. GET HELP RIGHT AWAY IF:  You have more than a small amount of blood in your poop.  You see clumps of tissue (blood clots) in your poop.  Your belly is puffy (swollen).  You feel sick to your stomach (nauseous) or throw up (vomit).  You have a fever.  You have belly pain that gets worse and medicine does not help. MAKE SURE YOU:  Understand these instructions.  Will watch your condition.  Will get help right away if you are not doing well or get worse.   This information is not intended to replace advice given to you by your health care  provider. Make sure you discuss any questions you have with your health care provider.   Document Released: 08/26/2010 Document Revised: 07/29/2013 Document Reviewed: 03/31/2013 Elsevier Interactive Patient Education 2016 Elsevier Inc[employer]    Colon Polyps Polyps are lumps of extra tissue growing inside the body. Polyps can grow in the large intestine (colon). Most colon polyps are noncancerous (benign). However, some colon polyps can become cancerous over time. Polyps that are larger than a pea may be harmful. To be safe, caregivers remove and test all polyps. CAUSES  Polyps form when mutations in the genes cause your cells to grow and divide even though no more tissue is needed. RISK FACTORS There are a number of risk factors that can increase your chances of getting colon polyps. They include:  Being older than 50 years.  Family history of colon polyps or colon cancer.  Long-term colon diseases, such as colitis or Crohn disease.  Being overweight.  Smoking.  Being inactive.  Drinking too much alcohol. SYMPTOMS  Most small polyps do not cause symptoms. If symptoms are present, they may include:  Blood in the stool. The stool may look dark red or black.  Constipation or diarrhea that lasts longer than 1 week. DIAGNOSIS People often do not know they have polyps until their caregiver finds them during a regular checkup. Your caregiver can use  4 tests to check for polyps:  Digital rectal exam. The caregiver wears gloves and feels inside the rectum. This test would find polyps only in the rectum.  Barium enema. The caregiver puts a liquid called barium into your rectum before taking X-rays of your colon. Barium makes your colon look white. Polyps are dark, so they are easy to see in the X-ray pictures.  Sigmoidoscopy. A thin, flexible tube (sigmoidoscope) is placed into your rectum. The sigmoidoscope has a light and tiny camera in it. The caregiver uses the sigmoidoscope to look at  the last third of your colon.  Colonoscopy. This test is like sigmoidoscopy, but the caregiver looks at the entire colon. This is the most common method for finding and removing polyps. TREATMENT  Any polyps will be removed during a sigmoidoscopy or colonoscopy. The polyps are then tested for cancer. PREVENTION  To help lower your risk of getting more colon polyps:  Eat plenty of fruits and vegetables. Avoid eating fatty foods.  Do not smoke.  Avoid drinking alcohol.  Exercise every day.  Lose weight if recommended by your caregiver.  Eat plenty of calcium and folate. Foods that are rich in calcium include milk, cheese, and broccoli. Foods that are rich in folate include chickpeas, kidney beans, and spinach. HOME CARE INSTRUCTIONS Keep all follow-up appointments as directed by your caregiver. You may need periodic exams to check for polyps. SEEK MEDICAL CARE IF: You notice bleeding during a bowel movement.   This information is not intended to replace advice given to you by your health care provider. Make sure you discuss any questions you have with your health care provider.   Document Released: 04/19/2004 Document Revised: 08/14/2014 Document Reviewed: 10/03/2011 Elsevier Interactive Patient Education 2016 [employer].   Diverticulosis Diverticulosis is the condition that develops when small pouches (diverticula) form in the wall of your colon. Your colon, or large intestine, is where water is absorbed and stool is formed. The pouches form when the inside layer of your colon pushes through weak spots in the outer layers of your colon. CAUSES  No one knows exactly what causes diverticulosis. RISK FACTORS  Being older than 69. Your risk for this condition increases with age. Diverticulosis is rare in people younger than 40 years. By age 44, almost everyone has it.  Eating a low-fiber diet.  Being frequently constipated.  Being overweight.  Not getting enough  exercise.  Smoking.  Taking over-the-counter pain medicines, like aspirin and ibuprofen. SYMPTOMS  Most people with diverticulosis do not have symptoms. DIAGNOSIS  Because diverticulosis often has no symptoms, health care providers often discover the condition during an exam for other colon problems. In many cases, a health care provider will diagnose diverticulosis while using a flexible scope to examine the colon (colonoscopy). TREATMENT  If you have never developed an infection related to diverticulosis, you may not need treatment. If you have had an infection before, treatment may include:  Eating more fruits, vegetables, and grains.  Taking a fiber supplement.  Taking a live bacteria supplement (probiotic).  Taking medicine to relax your colon. HOME CARE INSTRUCTIONS   Drink at least 6-8 glasses of water each day to prevent constipation.  Try not to strain when you have a bowel movement.  Keep all follow-up appointments. If you have had an infection before:  Increase the fiber in your diet as directed by your health care provider or dietitian.  Take a dietary fiber supplement if your health care provider approves.  Only take medicines as directed by your health care provider. SEEK MEDICAL CARE IF:   You have abdominal pain.  You have bloating.  You have cramps.  You have not gone to the bathroom in 3 days. SEEK IMMEDIATE MEDICAL CARE IF:   Your pain gets worse.  Yourbloating becomes very bad.  You have a fever or chills, and your symptoms suddenly get worse.  You begin vomiting.  You have bowel movements that are bloody or black. MAKE SURE YOU:  Understand these instructions.  Will watch your condition.  Will get help right away if you are not doing well or get worse.   This information is not intended to replace advice given to you by your health care provider. Make sure you discuss any questions you have with your health care provider.    Document Released: 04/20/2004 Document Revised: 07/29/2013 Document Reviewed: 06/18/2013 Elsevier Interactive Patient Education 2016 Elsevier Inc.   High-Fiber Diet Fiber, also called dietary fiber, is a type of carbohydrate found in fruits, vegetables, whole grains, and beans. A high-fiber diet can have many health benefits. Your health care provider may recommend a high-fiber diet to help:  Prevent constipation. Fiber can make your bowel movements more regular.  Lower your cholesterol.  Relieve hemorrhoids, uncomplicated diverticulosis, or irritable bowel syndrome.  Prevent overeating as part of a weight-loss plan.  Prevent heart disease, type 2 diabetes, and certain cancers. WHAT IS MY PLAN? The recommended daily intake of fiber includes:  38 grams for men under age 8.  73 grams for men over age 27.  64 grams for women under age 84.  87 grams for women over age 74. You can get the recommended daily intake of dietary fiber by eating a variety of fruits, vegetables, grains, and beans. Your health care provider may also recommend a fiber supplement if it is not possible to get enough fiber through your diet. WHAT DO I NEED TO KNOW ABOUT A HIGH-FIBER DIET?  Fiber supplements have not been widely studied for their effectiveness, so it is better to get fiber through food sources.  Always check the fiber content on thenutrition facts label of any prepackaged food. Look for foods that contain at least 5 grams of fiber per serving.  Ask your dietitian if you have questions about specific foods that are related to your condition, especially if those foods are not listed in the following section.  Increase your daily fiber consumption gradually. Increasing your intake of dietary fiber too quickly may cause bloating, cramping, or gas.  Drink plenty of water. Water helps you to digest fiber. WHAT FOODS CAN I EAT? Grains Whole-grain breads. Multigrain cereal. Oats and oatmeal. Brown  rice. Barley. Bulgur wheat. [place]. Bran muffins. Popcorn. Rye wafer crackers. Vegetables Sweet potatoes. Spinach. Kale. Artichokes. Cabbage. Broccoli. Green peas. Carrots. Squash. Fruits Berries. Pears. Apples. Oranges. Avocados. Prunes and raisins. Dried figs. Meats and Other Protein Sources [place], kidney, pinto, and soy beans. Split peas. Lentils. Nuts and seeds. Dairy Fiber-fortified yogurt. Beverages Fiber-fortified soy milk. Fiber-fortified orange juice. Other Fiber bars. The items listed above may not be a complete list of recommended foods or beverages. Contact your dietitian for more options. WHAT FOODS ARE NOT RECOMMENDED? Grains White bread. Pasta made with refined flour. White rice. Vegetables Fried potatoes. Canned vegetables. Well-cooked vegetables.  Fruits Fruit juice. Cooked, strained fruit. Meats and Other Protein Sources Fatty cuts of meat. Fried Sales executive or fried fish. Dairy Milk. Yogurt. Cream cheese. Sour cream. Beverages Soft drinks. Other  Cakes and pastries. Butter and oils. The items listed above may not be a complete list of foods and beverages to avoid. Contact your dietitian for more information. WHAT ARE SOME TIPS FOR INCLUDING HIGH-FIBER FOODS IN MY DIET?  Eat a wide variety of high-fiber foods.  Make sure that half of all grains consumed each day are whole grains.  Replace breads and cereals made from refined flour or white flour with whole-grain breads and cereals.  Replace white rice with brown rice, bulgur wheat, or millet.  Start the day with a breakfast that is high in fiber, such as a cereal that contains at least 5 grams of fiber per serving.  Use beans in place of meat in soups, salads, or pasta.  Eat high-fiber snacks, such as berries, raw vegetables, nuts, or popcorn.   This information is not intended to replace advice given to you by your health care provider. Make sure you discuss any questions you have with your health care  provider.   Document Released: 07/24/2005 Document Revised: 08/14/2014 Document Reviewed: 01/06/2014 Elsevier Interactive Patient Education [employer].

## 2016-03-28 ENCOUNTER — Encounter (HOSPITAL_COMMUNITY): Payer: Self-pay | Admitting: Internal Medicine

## 2016-03-30 DIAGNOSIS — M810 Age-related osteoporosis without current pathological fracture: Secondary | ICD-10-CM | POA: Diagnosis not present

## 2016-03-30 DIAGNOSIS — M0579 Rheumatoid arthritis with rheumatoid factor of multiple sites without organ or systems involvement: Secondary | ICD-10-CM | POA: Diagnosis not present

## 2016-04-12 ENCOUNTER — Encounter: Payer: Self-pay | Admitting: Family Medicine

## 2016-04-12 ENCOUNTER — Telehealth: Payer: Self-pay | Admitting: Family Medicine

## 2016-04-12 DIAGNOSIS — M810 Age-related osteoporosis without current pathological fracture: Secondary | ICD-10-CM | POA: Diagnosis not present

## 2016-04-12 NOTE — Telephone Encounter (Signed)
Please review blood work results from [facility] in folder.

## 2016-04-12 NOTE — Telephone Encounter (Signed)
A letter was sent to the patient lab work overall looks good

## 2016-04-18 DIAGNOSIS — M810 Age-related osteoporosis without current pathological fracture: Secondary | ICD-10-CM | POA: Diagnosis not present

## 2016-04-27 ENCOUNTER — Other Ambulatory Visit: Payer: Self-pay | Admitting: Nurse Practitioner

## 2016-04-27 ENCOUNTER — Encounter: Payer: Self-pay | Admitting: Family Medicine

## 2016-04-27 MED ORDER — TIZANIDINE HCL 2 MG PO TABS: ORAL_TABLET | ORAL | 1 refills | Status: DC

## 2016-04-27 MED ORDER — PROMETHAZINE HCL 25 MG PO TABS: ORAL_TABLET | ORAL | 1 refills | Status: DC

## 2016-04-28 ENCOUNTER — Other Ambulatory Visit: Payer: Self-pay

## 2016-04-28 ENCOUNTER — Other Ambulatory Visit: Payer: Self-pay | Admitting: Family Medicine

## 2016-04-28 DIAGNOSIS — Z1231 Encounter for screening mammogram for malignant neoplasm of breast: Secondary | ICD-10-CM

## 2016-04-28 MED ORDER — TIZANIDINE HCL 2 MG PO TABS: ORAL_TABLET | ORAL | 1 refills | Status: DC

## 2016-05-11 ENCOUNTER — Other Ambulatory Visit: Payer: Self-pay | Admitting: Family Medicine

## 2016-05-15 ENCOUNTER — Encounter: Payer: Self-pay | Admitting: Family Medicine

## 2016-05-15 ENCOUNTER — Ambulatory Visit (INDEPENDENT_AMBULATORY_CARE_PROVIDER_SITE_OTHER): Payer: Medicare Other | Admitting: Family Medicine

## 2016-05-15 VITALS — BP 114/74 | Ht 66.0 in | Wt 180.0 lb

## 2016-05-15 DIAGNOSIS — E782 Mixed hyperlipidemia: Secondary | ICD-10-CM

## 2016-05-15 DIAGNOSIS — I10 Essential (primary) hypertension: Secondary | ICD-10-CM | POA: Diagnosis not present

## 2016-05-15 DIAGNOSIS — G894 Chronic pain syndrome: Secondary | ICD-10-CM | POA: Diagnosis not present

## 2016-05-15 DIAGNOSIS — Z23 Encounter for immunization: Secondary | ICD-10-CM

## 2016-05-15 DIAGNOSIS — E038 Other specified hypothyroidism: Secondary | ICD-10-CM

## 2016-05-15 MED ORDER — HYDROMORPHONE HCL 2 MG PO TABS: 2.0000 mg | ORAL_TABLET | Freq: Four times a day (QID) | ORAL | 0 refills | Status: DC | PRN

## 2016-05-15 MED ORDER — GABAPENTIN 300 MG PO CAPS: ORAL_CAPSULE | ORAL | 3 refills | Status: DC

## 2016-05-15 MED ORDER — PROMETHAZINE HCL 25 MG PO TABS: ORAL_TABLET | ORAL | 3 refills | Status: DC

## 2016-05-15 MED ORDER — LEVOTHYROXINE SODIUM 112 MCG PO TABS: 112.0000 ug | ORAL_TABLET | Freq: Every day | ORAL | 3 refills | Status: DC

## 2016-05-15 MED ORDER — TRAMADOL HCL 50 MG PO TABS: 50.0000 mg | ORAL_TABLET | Freq: Four times a day (QID) | ORAL | 5 refills | Status: DC | PRN

## 2016-05-15 MED ORDER — TIZANIDINE HCL 2 MG PO TABS: 2.0000 mg | ORAL_TABLET | Freq: Three times a day (TID) | ORAL | 3 refills | Status: DC | PRN

## 2016-05-15 MED ORDER — AMLODIPINE BESYLATE 10 MG PO TABS: 10.0000 mg | ORAL_TABLET | Freq: Every day | ORAL | 3 refills | Status: DC

## 2016-05-15 NOTE — Progress Notes (Signed)
   Subjective:    Patient ID: Sabrina Adams, female    DOB: 1946/02/24, 70 y.o.   MRN: [id number]  Hypertension  This is a chronic problem. The current episode started more than 1 year ago.   Pt states no concerns today.   Pt needs refill on dilaudid. Pt states last refill was about 2 years ago.  Patient takes Dilaudid occasionally for severe pain otherwise uses tramadol tramadol does good job keeping her pain and check Gabapentin does good job helping with neuropathic pain does not cause drowsiness She uses Phenergan because of constant nausea does not cause drowsiness Takes her blood pressure medicine on a regular basis does not have any syncope See paper in folder for other refills pt needs today.  Takes acid blocker on a regular basis to control acid reflux Sees a rheumatologist on regular basis they take care of bone density Patient with a history of prediabetes tries watch starches in her diet. Flu vaccine today.    Review of Systems Arthralgias back pain denies chest pain shortness breath denies vomiting diarrhea bloody stools. No fevers chills sweats no wheezing or difficulty breathing    Objective:   Physical Exam Severe rheumatoid arthritis Lungs clear no crackles heart regular no murmurs pulses normal extremities no edema skin warm dry    Assessment & Plan:  HTN good control currently continue current medications watch diet closely Rheumatoid arthritis continue specialized medication. Recent lab work done by rheumatologist looks good Hyperlipidemia lab work ordered. Watch diet closely previous labs reviewed Hypothyroidism previous labs reviewed check TSH continue current medication Chronic pain related to rheumatoid arthritis tramadol when necessary for pain allotted when pain is severe Greater and 25 minutes spent with the patient greater than half in discussion of multiple issues

## 2016-05-18 DIAGNOSIS — E038 Other specified hypothyroidism: Secondary | ICD-10-CM | POA: Diagnosis not present

## 2016-05-18 DIAGNOSIS — E782 Mixed hyperlipidemia: Secondary | ICD-10-CM | POA: Diagnosis not present

## 2016-05-19 LAB — LIPID PANEL
CHOLESTEROL TOTAL: 226 mg/dL — AB (ref 100–199)
Chol/HDL Ratio: 3.1 ratio units (ref 0.0–4.4)
HDL: 73 mg/dL (ref >39)
LDL Calculated: 116 mg/dL — ABNORMAL HIGH (ref 0–99)
TRIGLYCERIDES: 183 mg/dL — AB (ref 0–149)
VLDL Cholesterol Cal: 37 mg/dL (ref 5–40)

## 2016-05-19 LAB — TSH: TSH: 0.801 u[IU]/mL (ref 0.450–4.500)

## 2016-05-24 ENCOUNTER — Ambulatory Visit (HOSPITAL_COMMUNITY)
Admission: RE | Admit: 2016-05-24 | Discharge: 2016-05-24 | Disposition: A | Discharge status: Home / Self Care | Payer: Medicare Other | Source: Ambulatory Visit | Attending: Family Medicine | Admitting: Family Medicine

## 2016-05-24 DIAGNOSIS — Z1231 Encounter for screening mammogram for malignant neoplasm of breast: Secondary | ICD-10-CM | POA: Diagnosis not present

## 2016-08-03 DIAGNOSIS — H401121 Primary open-angle glaucoma, left eye, mild stage: Secondary | ICD-10-CM | POA: Diagnosis not present

## 2016-08-03 DIAGNOSIS — H401124 Primary open-angle glaucoma, left eye, indeterminate stage: Secondary | ICD-10-CM | POA: Diagnosis not present

## 2016-09-28 DIAGNOSIS — M0579 Rheumatoid arthritis with rheumatoid factor of multiple sites without organ or systems involvement: Secondary | ICD-10-CM | POA: Diagnosis not present

## 2016-09-28 DIAGNOSIS — M810 Age-related osteoporosis without current pathological fracture: Secondary | ICD-10-CM | POA: Diagnosis not present

## 2016-09-28 DIAGNOSIS — R202 Paresthesia of skin: Secondary | ICD-10-CM | POA: Diagnosis not present

## 2016-09-28 DIAGNOSIS — M25439 Effusion, unspecified wrist: Secondary | ICD-10-CM | POA: Diagnosis not present

## 2016-10-13 ENCOUNTER — Ambulatory Visit: Payer: Medicare Other | Admitting: Family Medicine

## 2016-11-13 ENCOUNTER — Ambulatory Visit (INDEPENDENT_AMBULATORY_CARE_PROVIDER_SITE_OTHER): Payer: Medicare Other | Admitting: Family Medicine

## 2016-11-13 VITALS — BP 130/86

## 2016-11-13 DIAGNOSIS — E782 Mixed hyperlipidemia: Secondary | ICD-10-CM | POA: Diagnosis not present

## 2016-11-13 DIAGNOSIS — G894 Chronic pain syndrome: Secondary | ICD-10-CM | POA: Diagnosis not present

## 2016-11-13 DIAGNOSIS — M0579 Rheumatoid arthritis with rheumatoid factor of multiple sites without organ or systems involvement: Secondary | ICD-10-CM | POA: Diagnosis not present

## 2016-11-13 DIAGNOSIS — K219 Gastro-esophageal reflux disease without esophagitis: Secondary | ICD-10-CM

## 2016-11-13 DIAGNOSIS — M67432 Ganglion, left wrist: Secondary | ICD-10-CM

## 2016-11-13 DIAGNOSIS — Z1159 Encounter for screening for other viral diseases: Secondary | ICD-10-CM | POA: Diagnosis not present

## 2016-11-13 DIAGNOSIS — I10 Essential (primary) hypertension: Secondary | ICD-10-CM | POA: Diagnosis not present

## 2016-11-13 MED ORDER — POLYETHYLENE GLYCOL 3350 17 GM/SCOOP PO POWD: ORAL | 1 refills | Status: DC

## 2016-11-13 MED ORDER — PANTOPRAZOLE SODIUM 40 MG PO TBEC: 40.0000 mg | DELAYED_RELEASE_TABLET | Freq: Every day | ORAL | 3 refills | Status: DC

## 2016-11-13 MED ORDER — HYDROMORPHONE HCL 2 MG PO TABS: 2.0000 mg | ORAL_TABLET | Freq: Four times a day (QID) | ORAL | 0 refills | Status: DC | PRN

## 2016-11-13 MED ORDER — TRAMADOL HCL 50 MG PO TABS: 50.0000 mg | ORAL_TABLET | Freq: Four times a day (QID) | ORAL | 5 refills | Status: DC | PRN

## 2016-11-13 NOTE — Progress Notes (Signed)
   Subjective:    Patient ID: Sabrina Adams, female    DOB: 02-26-1946, 71 y.o.   MRN: [id number]  Hypertension  This is a chronic problem. The current episode started more than 1 year ago.   Patient has concern of cyst to left wrist.  Patient takes her thyroid medicine regular basis previous labs look good Patient takes blood pressure medicine a regular basis watch his diet unable to stay much in way of physical activity because of severe rheumatoid arthritis Severe rheumatoid arthritis is followed by rheumatologist has her on medicines uses the allotted when necessary for severe pain otherwise takes tramadol Patient denies any falls depression denies injuries. Takes her reflux medicine regular basis keeps it under good control Uses Restoril sparingly for insomnia  She does have a cyst on her left wrist not causing her any pain currently rheumatologist tried to aspirate it but was unable to aspirate anything  Review of Systems Currently denies any chest tightness pressure pain shortness breath nausea vomiting diarrhea fever chills sweats    Objective:   Physical Exam Ganglion cyst noted on the left wrist severe rheumatoid arthritis noted lungs clear heart regular extremities no edema skin warm       Assessment & Plan:  Ganglion cyst left wrist should gradually get better may need referral to orthopedics if persists or gets worse Blood pressure good control continue current measures Screening for hep C Reflux good control current medication Rheumatoid arthritis followed by specialist Hyperlipidemia previous labs reviewed can continue dietary measures Chronic pain tramadol for regular pain Dilaudid when pain is severe Follow-up within 6 months sooner problems

## 2016-11-14 LAB — BASIC METABOLIC PANEL
BUN/Creatinine Ratio: 29 — ABNORMAL HIGH (ref 12–28)
BUN: 24 mg/dL (ref 8–27)
CALCIUM: 11 mg/dL — AB (ref 8.7–10.3)
CO2: 31 mmol/L — AB (ref 18–29)
Chloride: 97 mmol/L (ref 96–106)
Creatinine, Ser: 0.83 mg/dL (ref 0.57–1.00)
GFR calc Af Amer: 84 mL/min/{1.73_m2} (ref >59)
GFR, EST NON AFRICAN AMERICAN: 73 mL/min/{1.73_m2} (ref >59)
Glucose: 108 mg/dL — ABNORMAL HIGH (ref 65–99)
POTASSIUM: 4.5 mmol/L (ref 3.5–5.2)
Sodium: 143 mmol/L (ref 134–144)

## 2016-11-14 LAB — HEPATITIS C ANTIBODY: Hep C Virus Ab: <0.1 s/co ratio (ref 0.0–0.9)

## 2017-01-10 ENCOUNTER — Encounter: Payer: Self-pay | Admitting: Family Medicine

## 2017-01-10 ENCOUNTER — Ambulatory Visit (INDEPENDENT_AMBULATORY_CARE_PROVIDER_SITE_OTHER): Payer: Medicare Other | Admitting: Family Medicine

## 2017-01-10 ENCOUNTER — Ambulatory Visit (HOSPITAL_COMMUNITY)
Admission: RE | Admit: 2017-01-10 | Discharge: 2017-01-10 | Disposition: A | Discharge status: Home / Self Care | Payer: Medicare Other | Source: Ambulatory Visit | Attending: Family Medicine | Admitting: Family Medicine

## 2017-01-10 VITALS — BP 148/86 | Temp 98.5°F | Ht 66.0 in

## 2017-01-10 DIAGNOSIS — M533 Sacrococcygeal disorders, not elsewhere classified: Secondary | ICD-10-CM | POA: Diagnosis not present

## 2017-01-10 DIAGNOSIS — M545 Low back pain, unspecified: Secondary | ICD-10-CM

## 2017-01-10 DIAGNOSIS — G8929 Other chronic pain: Secondary | ICD-10-CM | POA: Diagnosis not present

## 2017-01-10 DIAGNOSIS — M435X6 Other recurrent vertebral dislocation, lumbar region: Secondary | ICD-10-CM | POA: Insufficient documentation

## 2017-01-10 DIAGNOSIS — M47896 Other spondylosis, lumbar region: Secondary | ICD-10-CM | POA: Insufficient documentation

## 2017-01-10 MED ORDER — PREDNISONE 10 MG PO TABS: ORAL_TABLET | ORAL | 0 refills | Status: DC

## 2017-01-10 MED ORDER — HYDROMORPHONE HCL 2 MG PO TABS: 2.0000 mg | ORAL_TABLET | Freq: Four times a day (QID) | ORAL | 0 refills | Status: DC | PRN

## 2017-01-10 NOTE — Progress Notes (Signed)
   Subjective:    Patient ID: Sabrina Adams, female    DOB: April 08, 1946, 71 y.o.   MRN: [id number] Patient arrives office for protracted discussion regarding pain Back Pain  This is a new problem. Pain location: lumbar right side. Treatments tried: dilaudid, tramadol. The treatment provided no relief.  Pain has been located in the right lumbar region. No radiation.Hervey Ard at times. Worse with motion.  Has felt it off-and-on for the last couple weeks. Worse the last few days.  Severe enough to cause crying at times.  No radiation into the leg.  Positive history of MRSA infection of spine next  History of osteopenia no history of lumbar stress fracture.   Two weeks pain, off and on for two weeks, worse the pas few days  MRSA History  Review of Systems  Musculoskeletal: Positive for back pain.  No headache, no major weight loss or weight gain, no chest pain no back pain abdominal pain no change in bowel habits complete ROS otherwise negative      Objective:   Physical Exam   Alert and oriented, vitals reviewed and stable, In some distress with pain ENT-TM's and ext canals WNL bilat via otoscopic exam Soft palate, tonsils and post pharynx WNL via oropharyngeal exam Neck-symmetric, no masses; thyroid nonpalpable and nontender Pulmonary-no tachypnea or accessory muscle use; Clear without wheezes via auscultation Card--no abnrml murmurs, rhythm reg and rate WNL Carotid pulses symmetric, without bruits   discussedrace present, and substantial rheumatoid arthritis changes Negative straight leg raise. Positive right SI tenderness to deep palpation no spinal tenderness    Assessment & Plan:  mpression SI joint inflammation multifactorial potential etiologies 1 increased strain on joint from wearing right leg brace. #2 couldn't be coming from rheumatoid arthritis flare #3 doubt MRSA discussedrace present  X-ray involved area. Dilaudid prescribed. Multiple questions answered.  Prednisone taper. Follow-up with Dr. Nicki Reaper in 1 week.

## 2017-01-16 ENCOUNTER — Ambulatory Visit (INDEPENDENT_AMBULATORY_CARE_PROVIDER_SITE_OTHER): Payer: Medicare Other | Admitting: Family Medicine

## 2017-01-16 VITALS — BP 112/72 | Ht 66.0 in | Wt 177.0 lb

## 2017-01-16 DIAGNOSIS — M545 Low back pain, unspecified: Secondary | ICD-10-CM

## 2017-01-16 DIAGNOSIS — M4316 Spondylolisthesis, lumbar region: Secondary | ICD-10-CM

## 2017-01-16 MED ORDER — HYDROMORPHONE HCL 2 MG PO TABS: 2.0000 mg | ORAL_TABLET | ORAL | 0 refills | Status: DC | PRN

## 2017-01-16 NOTE — Progress Notes (Signed)
   Subjective:    Patient ID: Sabrina Adams, female    DOB: 19-Jul-1946, 71 y.o.   MRN: [id number]  HPI  Pt here today for follow up on joint pain. No other concerns Patient relates severe pain in lower back more on the right side occasionally radiates into the leg there is no position she can be back gets relief no matter where she moves she has severe pain The patient had onset of this after she had a fall the patient has osteoporosis of therefore at high risk of compression fractures that will not be picked up on a regular x-ray because of her osteoporosis She is having take pain medicine every 3-4 hours sometimes 4 or 5 in a day she denies causing drowsiness with it does not affect her breathing Her bowels are moving okay denies loss of bowel or bladder Patient had x-rays which shows osteoporosis and also severe spondylosis and spondylolisthesis along with moderate deviation of the spine MRIs recommended by radiology to rule out the possibility of compression fractures which would not be seen because of her osteoporosis as well as to evaluate her spinal cord Review of Systems Denies any bowel or bladder trouble denies abdominal issues denies vomiting fever chills sweats relates severe pain discomfort    Objective:   Physical Exam Patient not obvious severe pain and discomfort lungs are clear hearts regular low back significant pain and tenderness did not improve with prednisone in minimal improvement with dialogue Patient has weakness in her legs related to severe pain in her back it is hard to quantify because she wears a brace on the right leg because of previous disability issues       Assessment & Plan:  Significant degenerative low back pain as well as subluxation and spondylolisthesis. Given her severity of the pain in the possibility that there could be some underlying issues I would recommend MRI of the lower back she is had previous surgery to therefore an MRI with  contrast  Pain control she may use of pain medicine every 4 hours as needed for pain caution drowsiness if ongoing troubles or worse to follow-up when she needs a new pain prescription she can call us  Follow-up again in several weeks' time  Probable referral to neurosurgery but not until we get an MRI.

## 2017-01-29 ENCOUNTER — Other Ambulatory Visit: Payer: Self-pay | Admitting: Family Medicine

## 2017-01-29 MED ORDER — HYDROMORPHONE HCL 2 MG PO TABS: 2.0000 mg | ORAL_TABLET | Freq: Four times a day (QID) | ORAL | 0 refills | Status: DC | PRN

## 2017-01-29 NOTE — Telephone Encounter (Signed)
She may have a prescription for the dilaudid 2 mg, 1 every 6 hours when necessary severe pain, #120

## 2017-01-29 NOTE — Telephone Encounter (Signed)
Patient states that she is taking 1 tablet by mouth every 6 hours. Please advise?

## 2017-01-29 NOTE — Addendum Note (Signed)
Addended by: Launa Grill on: 01/29/2017 01:36 PM   Modules accepted: Orders

## 2017-01-29 NOTE — Telephone Encounter (Signed)
Patient is been having a lot of pain recently please find out from the patient how she is taking the pain medicine currently so we can prescribed a month supply-drug registry last amount was 30 tablets on June 14

## 2017-01-29 NOTE — Telephone Encounter (Signed)
Spoke with patient and informed her per Dr.Scott Luking- We have prescription ready for pick up on your Dilaudid 2 mg. Patient verbalized understanding.

## 2017-01-29 NOTE — Telephone Encounter (Signed)
Left message return call 01/29/2017

## 2017-01-29 NOTE — Telephone Encounter (Signed)
Patient needing new prescription for dilaudid 2mg .

## 2017-03-20 ENCOUNTER — Other Ambulatory Visit: Payer: Self-pay | Admitting: Family Medicine

## 2017-03-22 ENCOUNTER — Telehealth: Payer: Self-pay | Admitting: Family Medicine

## 2017-03-22 NOTE — Telephone Encounter (Signed)
Pt is requesting a refill on  HYDROmorphone (DILAUDID) 2 MG tablet

## 2017-03-23 MED ORDER — HYDROMORPHONE HCL 2 MG PO TABS: ORAL_TABLET | ORAL | 0 refills | Status: DC

## 2017-03-23 NOTE — Telephone Encounter (Signed)
Patient is aware, she will schedule an appt w [place] for September. She is aware to pick up script from the front desk here at [facility].

## 2017-03-23 NOTE — Telephone Encounter (Signed)
She may have a refill of her medication for 120 tablets one 4 times a day when necessary pain please remind the patient she will need to do a pain management follow-up visit in September last visit was in June federal guidelines every 3 months for these type of medicines

## 2017-03-23 NOTE — Telephone Encounter (Signed)
I called and left a vm,asked that she r/c. 

## 2017-03-29 DIAGNOSIS — M25439 Effusion, unspecified wrist: Secondary | ICD-10-CM | POA: Diagnosis not present

## 2017-03-29 DIAGNOSIS — R202 Paresthesia of skin: Secondary | ICD-10-CM | POA: Diagnosis not present

## 2017-03-29 DIAGNOSIS — M810 Age-related osteoporosis without current pathological fracture: Secondary | ICD-10-CM | POA: Diagnosis not present

## 2017-03-29 DIAGNOSIS — M0579 Rheumatoid arthritis with rheumatoid factor of multiple sites without organ or systems involvement: Secondary | ICD-10-CM | POA: Diagnosis not present

## 2017-03-30 ENCOUNTER — Encounter: Payer: Self-pay | Admitting: Family Medicine

## 2017-04-11 NOTE — Telephone Encounter (Signed)
Patient has rheumatologist in [place] per her note. She is requesting any labs that we have done within the past one year be forwarded to her rheumatologist. Please do so, please send a message to the patient letting her know we did so thank you

## 2017-04-13 DIAGNOSIS — M810 Age-related osteoporosis without current pathological fracture: Secondary | ICD-10-CM | POA: Diagnosis not present

## 2017-04-18 DIAGNOSIS — M810 Age-related osteoporosis without current pathological fracture: Secondary | ICD-10-CM | POA: Diagnosis not present

## 2017-04-19 ENCOUNTER — Encounter: Payer: Self-pay | Admitting: Family Medicine

## 2017-04-19 ENCOUNTER — Ambulatory Visit (INDEPENDENT_AMBULATORY_CARE_PROVIDER_SITE_OTHER): Payer: Medicare Other | Admitting: Family Medicine

## 2017-04-19 VITALS — BP 122/84 | Ht 66.0 in | Wt 179.0 lb

## 2017-04-19 DIAGNOSIS — Z79891 Long term (current) use of opiate analgesic: Secondary | ICD-10-CM | POA: Diagnosis not present

## 2017-04-19 MED ORDER — HYDROMORPHONE HCL 2 MG PO TABS: ORAL_TABLET | ORAL | 0 refills | Status: DC

## 2017-04-19 MED ORDER — TRAMADOL HCL 50 MG PO TABS: 50.0000 mg | ORAL_TABLET | Freq: Four times a day (QID) | ORAL | 5 refills | Status: DC | PRN

## 2017-04-19 NOTE — Progress Notes (Signed)
   Subjective:    Patient ID: Sabrina Adams, female    DOB: 13-May-1946, 71 y.o.   MRN: [id number]  HPI This patient was seen today for chronic pain. Takes for right hip pain.   The medication list was reviewed and updated.   -Compliance with medication: yes  - Number patient states they take daily: no more than one per day  -when was the last dose patient took?  2 days ago  The patient was advised the importance of maintaining medication and not using illegal substances with these.  Refills needed: no  The patient was educated that we can provide 3 monthly scripts for their medication, it is their responsibility to follow the instructions.  Side effects or complications from medications: sleepy, dry mouth  Patient is aware that pain medications are meant to minimize the severity of the pain to allow their pain levels to improve to allow for better function. They are aware of that pain medications cannot totally remove their pain.  Due for UDT ( at least once per year) : due today  No concerns today.         Review of Systems  Constitutional: Negative for activity change, appetite change, fatigue and fever.  HENT: Negative for congestion.   Respiratory: Negative for cough, chest tightness and shortness of breath.   Cardiovascular: Negative for chest pain and leg swelling.  Gastrointestinal: Negative for abdominal pain and vomiting.  Musculoskeletal: Positive for arthralgias, back pain and joint swelling.  Skin: Negative for color change.  Neurological: Negative for weakness and headaches.  Psychiatric/Behavioral: Negative for behavioral problems and confusion.       Objective:   Physical Exam  Constitutional: She appears well-developed and well-nourished. No distress.  HENT:  Head: Normocephalic and atraumatic.  Eyes: Right eye exhibits no discharge. Left eye exhibits no discharge.  Neck: No tracheal deviation present.  Cardiovascular: Normal rate, regular  rhythm and normal heart sounds.   No murmur heard. Pulmonary/Chest: Effort normal and breath sounds normal. No respiratory distress. She has no wheezes. She has no rales.  Musculoskeletal: She exhibits no edema.  Lymphadenopathy:    She has no cervical adenopathy.  Neurological: She is alert. She exhibits normal muscle tone.  Skin: Skin is warm and dry. No erythema.  Psychiatric: Her behavior is normal.  Vitals reviewed.  Patient also has significant deformity in her hands wrists elbows knees ankles related to arthritis        Assessment & Plan:  Long history of rheumatoid arthritis followed by specialist Has severe chronic pain She is taking medication responsibly for years Urine drug screen per protocol Drug contract reviewed and signed Tramadol when necessary during the day Takes Dilaudid pain medicine when necessary cautioned drowsiness not to overuse. Maximum 4 per day

## 2017-04-25 LAB — TOXASSURE SELECT 13 (MW), URINE

## 2017-04-30 ENCOUNTER — Other Ambulatory Visit: Payer: Self-pay | Admitting: Family Medicine

## 2017-04-30 DIAGNOSIS — Z1231 Encounter for screening mammogram for malignant neoplasm of breast: Secondary | ICD-10-CM

## 2017-05-16 ENCOUNTER — Ambulatory Visit (INDEPENDENT_AMBULATORY_CARE_PROVIDER_SITE_OTHER): Payer: Medicare Other | Admitting: Family Medicine

## 2017-05-16 ENCOUNTER — Encounter: Payer: Self-pay | Admitting: Family Medicine

## 2017-05-16 DIAGNOSIS — I10 Essential (primary) hypertension: Secondary | ICD-10-CM

## 2017-05-16 DIAGNOSIS — G894 Chronic pain syndrome: Secondary | ICD-10-CM

## 2017-05-16 DIAGNOSIS — E038 Other specified hypothyroidism: Secondary | ICD-10-CM

## 2017-05-16 DIAGNOSIS — E782 Mixed hyperlipidemia: Secondary | ICD-10-CM

## 2017-05-16 DIAGNOSIS — Z23 Encounter for immunization: Secondary | ICD-10-CM

## 2017-05-16 MED ORDER — TIZANIDINE HCL 2 MG PO TABS: 2.0000 mg | ORAL_TABLET | Freq: Three times a day (TID) | ORAL | 1 refills | Status: DC | PRN

## 2017-05-16 MED ORDER — LEVOTHYROXINE SODIUM 112 MCG PO TABS: 112.0000 ug | ORAL_TABLET | Freq: Every day | ORAL | 1 refills | Status: DC

## 2017-05-16 MED ORDER — PROMETHAZINE HCL 25 MG PO TABS: ORAL_TABLET | ORAL | 1 refills | Status: DC

## 2017-05-16 MED ORDER — AMLODIPINE BESYLATE 10 MG PO TABS: 10.0000 mg | ORAL_TABLET | Freq: Every day | ORAL | 1 refills | Status: DC

## 2017-05-16 MED ORDER — LEVOTHYROXINE SODIUM 112 MCG PO TABS: 112.0000 ug | ORAL_TABLET | Freq: Every day | ORAL | 3 refills | Status: DC

## 2017-05-16 NOTE — Progress Notes (Signed)
   Subjective:    Patient ID: Sabrina Adams, female    DOB: 11/14/1945, 71 y.o.   MRN: [id number]  Hypertension  Pertinent negatives include no chest pain.  Patient is here today to follow up on her hypertension. She is currently taking Norvasc 10 mg Daily. Eats healthy and cant exercise. Patient has had some electrolyte abnormalities we will repeat checking calcium again may need further workup depending on the results Blood pressure takes her medicine on a regular basis tries the healthy denies chest tightness states under good control Thyroid takes her medicine regular basis she believes is under good control Cholesterol issues she does try to eat healthy. Will need to recheck lab work Pain management This patient was seen today for chronic pain  The medication list was reviewed and updated.   -Compliance with medication: She states she does well with the medicine  - Number patient states they take daily: Uses tramadol on a frequent basis uses Dilaudid on intermittent basis but not frequently  -when was the last dose patient took? Took tramadol today  The patient was advised the importance of maintaining medication and not using illegal substances with these.  Refills needed: Is okay on refills currently except for tramadol  The patient was educated that we can provide 3 monthly scripts for their medication, it is their responsibility to follow the instructions.  Side effects or complications from medications: She denies any side effects with the medicine  Patient is aware that pain medications are meant to minimize the severity of the pain to allow their pain levels to improve to allow for better function. They are aware of that pain medications cannot totally remove their pain.  Due for UDT ( at least once per year) : Not due for urine drug screen currently      [facility] Review of Systems  Constitutional: Negative for activity change and  appetite change.  HENT: Negative for congestion.   Respiratory: Negative for cough.   Cardiovascular: Negative for chest pain.  Gastrointestinal: Negative for abdominal pain and vomiting.  Skin: Negative for color change.  Neurological: Negative for weakness.  Psychiatric/Behavioral: Negative for confusion.       Objective:   Physical Exam  Constitutional: She appears well-nourished. No distress.  HENT:  Head: Normocephalic.  Right Ear: External ear normal.  Left Ear: External ear normal.  Eyes: Right eye exhibits no discharge. Left eye exhibits no discharge.  Neck: No tracheal deviation present.  Cardiovascular: Normal rate, regular rhythm and normal heart sounds.   No murmur heard. Pulmonary/Chest: Effort normal and breath sounds normal. No respiratory distress. She has no wheezes. She has no rales.  Musculoskeletal: She exhibits no edema.  Lymphadenopathy:    She has no cervical adenopathy.  Neurological: She is alert.  Psychiatric: Her behavior is normal.  Vitals reviewed.         Assessment & Plan:  Rheumatoid arthritis per specialist Blood pressure good control continue current measures Thyroid check lab work continue current measures Pain medicine-does well with pain control helps her function better continue tramadol daily Dilaudid only as needed Hyperlipidemia check lipid profile watch diet closely Follow-up somewhere between 4 and 6 months

## 2017-05-17 DIAGNOSIS — M25532 Pain in left wrist: Secondary | ICD-10-CM | POA: Diagnosis not present

## 2017-05-17 DIAGNOSIS — M65142 Other infective (teno)synovitis, left hand: Secondary | ICD-10-CM | POA: Diagnosis not present

## 2017-05-17 DIAGNOSIS — M069 Rheumatoid arthritis, unspecified: Secondary | ICD-10-CM | POA: Diagnosis not present

## 2017-05-18 DIAGNOSIS — E782 Mixed hyperlipidemia: Secondary | ICD-10-CM | POA: Diagnosis not present

## 2017-05-18 DIAGNOSIS — I10 Essential (primary) hypertension: Secondary | ICD-10-CM | POA: Diagnosis not present

## 2017-05-18 DIAGNOSIS — E038 Other specified hypothyroidism: Secondary | ICD-10-CM | POA: Diagnosis not present

## 2017-05-20 LAB — BASIC METABOLIC PANEL
BUN/Creatinine Ratio: 18 (ref 12–28)
BUN: 15 mg/dL (ref 8–27)
CO2: 25 mmol/L (ref 20–29)
CREATININE: 0.82 mg/dL (ref 0.57–1.00)
Calcium: 9.6 mg/dL (ref 8.7–10.3)
Chloride: 98 mmol/L (ref 96–106)
GFR calc Af Amer: 84 mL/min/{1.73_m2} (ref >59)
GFR, EST NON AFRICAN AMERICAN: 73 mL/min/{1.73_m2} (ref >59)
GLUCOSE: 82 mg/dL (ref 65–99)
Potassium: 4.4 mmol/L (ref 3.5–5.2)
Sodium: 145 mmol/L — ABNORMAL HIGH (ref 134–144)

## 2017-05-20 LAB — TSH: TSH: 1.13 u[IU]/mL (ref 0.450–4.500)

## 2017-05-20 LAB — LIPID PANEL
CHOLESTEROL TOTAL: 260 mg/dL — AB (ref 100–199)
Chol/HDL Ratio: 3.6 ratio (ref 0.0–4.4)
HDL: 73 mg/dL (ref >39)
LDL Calculated: 127 mg/dL — ABNORMAL HIGH (ref 0–99)
Triglycerides: 299 mg/dL — ABNORMAL HIGH (ref 0–149)
VLDL Cholesterol Cal: 60 mg/dL — ABNORMAL HIGH (ref 5–40)

## 2017-05-28 ENCOUNTER — Encounter (HOSPITAL_COMMUNITY): Payer: Self-pay

## 2017-05-28 ENCOUNTER — Ambulatory Visit (HOSPITAL_COMMUNITY)
Admission: RE | Admit: 2017-05-28 | Discharge: 2017-05-28 | Disposition: A | Discharge status: Home / Self Care | Payer: Medicare Other | Source: Ambulatory Visit | Attending: Family Medicine | Admitting: Family Medicine

## 2017-05-28 DIAGNOSIS — Z1231 Encounter for screening mammogram for malignant neoplasm of breast: Secondary | ICD-10-CM | POA: Insufficient documentation

## 2017-07-11 ENCOUNTER — Ambulatory Visit: Payer: Medicare Other | Admitting: Family Medicine

## 2017-08-30 DIAGNOSIS — H401121 Primary open-angle glaucoma, left eye, mild stage: Secondary | ICD-10-CM | POA: Diagnosis not present

## 2017-09-13 ENCOUNTER — Ambulatory Visit (INDEPENDENT_AMBULATORY_CARE_PROVIDER_SITE_OTHER): Payer: Medicare Other | Admitting: Family Medicine

## 2017-09-13 ENCOUNTER — Encounter: Payer: Self-pay | Admitting: Family Medicine

## 2017-09-13 VITALS — BP 128/72 | Ht 66.0 in | Wt 181.4 lb

## 2017-09-13 DIAGNOSIS — K219 Gastro-esophageal reflux disease without esophagitis: Secondary | ICD-10-CM | POA: Diagnosis not present

## 2017-09-13 DIAGNOSIS — G894 Chronic pain syndrome: Secondary | ICD-10-CM

## 2017-09-13 MED ORDER — TRAMADOL HCL 50 MG PO TABS: 50.0000 mg | ORAL_TABLET | Freq: Four times a day (QID) | ORAL | 5 refills | Status: DC | PRN

## 2017-09-13 MED ORDER — PANTOPRAZOLE SODIUM 40 MG PO TBEC: 40.0000 mg | DELAYED_RELEASE_TABLET | Freq: Every day | ORAL | 3 refills | Status: DC

## 2017-09-13 MED ORDER — GABAPENTIN 300 MG PO CAPS: ORAL_CAPSULE | ORAL | 3 refills | Status: DC

## 2017-09-13 MED ORDER — TRAMADOL HCL 50 MG PO TABS: 50.0000 mg | ORAL_TABLET | Freq: Four times a day (QID) | ORAL | 1 refills | Status: DC | PRN

## 2017-09-13 NOTE — Progress Notes (Signed)
   Subjective:    Patient ID: Sabrina Adams, female    DOB: 1946-01-06, 72 y.o.   MRN: [id number]  HPI This patient was seen today for chronic pain  The medication list was reviewed and updated.   -Compliance with medication: yes  - Number patient states they take daily: 3  -when was the last dose patient took? This morning around 11am  The patient was advised the importance of maintaining medication and not using illegal substances with these.  Here for refills and follow up  The patient was educated that we can provide 3 monthly scripts for their medication, it is their responsibility to follow the instructions.  Side effects or complications from medications: none  Patient is aware that pain medications are meant to minimize the severity of the pain to allow their pain levels to improve to allow for better function. They are aware of that pain medications cannot totally remove their pain.  Due for UDT ( at least once per year) : no last one done on 04/19/17        Review of Systems  Constitutional: Negative for activity change and appetite change.  HENT: Negative for congestion.   Respiratory: Negative for cough.   Cardiovascular: Negative for chest pain.  Gastrointestinal: Negative for abdominal pain and vomiting.  Skin: Negative for color change.  Neurological: Negative for weakness.  Psychiatric/Behavioral: Negative for confusion.       Objective:   Physical Exam  Constitutional: She appears well-nourished. No distress.  HENT:  Head: Normocephalic.  Right Ear: External ear normal.  Left Ear: External ear normal.  Eyes: Right eye exhibits no discharge. Left eye exhibits no discharge.  Neck: No tracheal deviation present.  Cardiovascular: Normal rate, regular rhythm and normal heart sounds.  No murmur heard. Pulmonary/Chest: Effort normal and breath sounds normal. No respiratory distress. She has no wheezes. She has no rales.  Musculoskeletal: She  exhibits no edema.  Lymphadenopathy:    She has no cervical adenopathy.  Neurological: She is alert.  Psychiatric: Her behavior is normal.  Vitals reviewed.   15 minutes was spent with patient today discussing healthcare issues which they came. Greater than half of the discussion was answering questions and giving management guidance regarding the diagnosis for which the patient came.  Please see diagnosis discuss  Drug registry was checked     Assessment & Plan:  The patient was seen today as part of a comprehensive visit regarding pain control. Patient's compliance with the medication as well as discussion regarding effectiveness was completed. Prescriptions were written. Patient was advised to follow-up in 3 months. The patient was assessed for any signs of severe side effects. The patient was advised to take the medicine as directed and to report to [place] if any side effect issues. Patient is on tramadol therefore we went ahead and gave her refills of this.  She Artie has enough Dilaudid.  She will follow-up here in 6 months  Comprehensive checkup in 6 months with lab work  Refills of medications given per patient request

## 2017-10-02 DIAGNOSIS — M0579 Rheumatoid arthritis with rheumatoid factor of multiple sites without organ or systems involvement: Secondary | ICD-10-CM | POA: Diagnosis not present

## 2017-10-02 DIAGNOSIS — M810 Age-related osteoporosis without current pathological fracture: Secondary | ICD-10-CM | POA: Diagnosis not present

## 2017-10-02 DIAGNOSIS — M79643 Pain in unspecified hand: Secondary | ICD-10-CM | POA: Diagnosis not present

## 2017-10-02 DIAGNOSIS — M25439 Effusion, unspecified wrist: Secondary | ICD-10-CM | POA: Diagnosis not present

## 2017-12-16 ENCOUNTER — Other Ambulatory Visit: Payer: Self-pay | Admitting: Family Medicine

## 2017-12-17 NOTE — Telephone Encounter (Signed)
[facility] pt 

## 2017-12-17 NOTE — Telephone Encounter (Signed)
This patient may have 4 refills

## 2017-12-19 ENCOUNTER — Ambulatory Visit (INDEPENDENT_AMBULATORY_CARE_PROVIDER_SITE_OTHER): Payer: Medicare Other | Admitting: Internal Medicine

## 2017-12-19 ENCOUNTER — Encounter (INDEPENDENT_AMBULATORY_CARE_PROVIDER_SITE_OTHER): Payer: Self-pay | Admitting: Internal Medicine

## 2017-12-19 VITALS — BP 112/58 | HR 70 | Temp 99.1°F | Resp 18 | Ht 65.0 in | Wt 175.2 lb

## 2017-12-19 DIAGNOSIS — K219 Gastro-esophageal reflux disease without esophagitis: Secondary | ICD-10-CM

## 2017-12-19 DIAGNOSIS — K5900 Constipation, unspecified: Secondary | ICD-10-CM

## 2017-12-19 NOTE — Patient Instructions (Addendum)
Take MiraLAX or polyethylene glycol daily but can titrate dose. Keep stool diary for 8 weeks as to frequency and consistency of stools and on days when you have good evacuation and also on days when he have to use Dulcolax suppository.

## 2017-12-19 NOTE — Progress Notes (Signed)
Presenting complaint;  Constipation.  Subjective:  Patient is 72 year old Caucasian female who is here for scheduled visit accompanied by her husband.  She was last seen in June 2017 for rectal bleeding.  Following that visit she underwent colonoscopy in August 2017 revealing 12 mm flat polyp in ascending colon and was tubular adenoma.  She also had moderate external hemorrhoids and few scattered diverticula throughout the colon with the colorectal anastomosis.  She now presents with worsening constipation.  She states she has had constipation for several years.  She may have 1 or 2 bowel movements per week.  She rarely has good evacuation.  On most occasion all she passes it hard palates.  She uses MiraLAX 2-3 times a week.  She denies melena or rectal bleeding.  She is trying to eat more fruits and vegetables and she eats 6 to 8 pounds every day.  She also drinks prune juice and eats figs but she is not having satisfactory results.  She is also using suppository occasionally. Her appetite is fair.  She has not lost any weight in the last 2 years.  She also complains of feeling tired.  Her husband had to bring her in a wheelchair today.  She generally does not use it. She says she takes Dilaudid no more than twice a month but she takes Phenergan once or twice a week. She did vomit 2 nights ago after evening meal.  She did not experience hematemesis.  She says heartburn is well controlled with PPI and she burps often.  Burps are not foul-smelling or associated with pain.    Current Medications: Outpatient Encounter Medications as of 12/19/2017  Medication Sig  . acetaminophen (TYLENOL) 500 MG tablet Take 500 mg by mouth 2 (two) times daily.   [name] acetaminophen (TYLENOL) 650 MG CR tablet Take 650 mg by mouth 2 (two) times daily.   [name] amLODipine (NORVASC) 10 MG tablet Take 1 tablet (10 mg total) by mouth daily.  [name] b complex vitamins tablet Take 1 tablet by mouth daily.  . Calcium Carbonate-Vitamin D  (CALCIUM 600 + D PO) Take 1 tablet by mouth 2 (two) times daily.  . Cholecalciferol (VITAMIN D3) 5000 UNITS TABS Take by mouth. Once a week  . docusate sodium (COLACE) 100 MG capsule Take 200 mg by mouth at bedtime.  . gabapentin (NEURONTIN) 300 MG capsule TAKE 1 CAPSULE three TIMES DAILY  . HYDROmorphone (DILAUDID) 2 MG tablet One po four times daily as needed  . hydroxychloroquine (PLAQUENIL) 200 MG tablet Take 1 tablet (200 mg total) by mouth 2 (two) times daily.  [name] levothyroxine (SYNTHROID, LEVOTHROID) 112 MCG tablet Take 1 tablet (112 mcg total) by mouth daily.  . Multiple Vitamin (MULTIVITAMIN WITH MINERALS) TABS Take 1 tablet by mouth daily.  . pantoprazole (PROTONIX) 40 MG tablet Take 1 tablet (40 mg total) by mouth daily.  . polyethylene glycol (MIRALAX / GLYCOLAX) packet Take 17 g by mouth daily as needed. Constipation  . predniSONE (DELTASONE) 2.5 MG tablet Take 7.5 mg by mouth daily with breakfast.  . Probiotic Product (PROBIOTIC DAILY PO) Take by mouth daily.  . promethazine (PHENERGAN) 25 MG tablet TAKE 1/2 TABLET TWICE DAILY AS NEEDED  . Propylene Glycol (SYSTANE BALANCE OP) Apply to eye 2 (two) times daily. Patient states that she uses 1 drop in both eye.  . psyllium (REGULOID) 0.52 G capsule Take 0.52 g by mouth daily.  . temazepam (RESTORIL) 30 MG capsule Take 1 capsule (30 mg total) by mouth  at bedtime as needed. Sleep  . timolol (TIMOPTIC) 0.5 % ophthalmic solution Place 1 drop into the left eye daily.   [name] tiZANidine (ZANAFLEX) 2 MG tablet Take 1 tablet (2 mg total) by mouth every 8 (eight) hours as needed.  . traMADol (ULTRAM) 50 MG tablet TAKE 1 TABLET BY MOUTH EVERY 6 HOURS AS NEEDED.  [name] Zoledronic Acid (RECLAST IV) Inject into the vein. yearly   No facility-administered encounter medications on file as of 12/19/2017.    Past Medical History:  Diagnosis Date  . Anemia   .       [name] Complication of anesthesia    nausea ,   . GERD (gastroesophageal reflux disease)   .  Hypertension   . Hypothyroidism   . Neuromuscular disorder ([facility])    neuropathy  legs  . PONV (postoperative nausea and vomiting)   . RA (rheumatoid arthritis) ([place])   . Sleep apnea   . Stroke [facility])    mild   Past Surgical History:  Procedure Laterality Date  . ABDOMINAL HYSTERECTOMY  2005  . ABDOMINAL SURGERY     2006 colectomy for diverticulitis  . ANKLE SURGERY    . COLON SURGERY     colostomy then revision  . COLONOSCOPY  04/04/2012   Procedure: COLONOSCOPY;  Surgeon: Rogene Houston, MD;  Location: [facility];  Service: Endoscopy;  Laterality: N/A;  410-Ok per [place]  . COLONOSCOPY N/A 03/23/2016   Procedure: COLONOSCOPY;  Surgeon: Rogene Houston, MD;  Location: [facility];  Service: Endoscopy;  Laterality: N/A;  2:50  . ESOPHAGOGASTRODUODENOSCOPY  04/04/2012   Procedure: ESOPHAGOGASTRODUODENOSCOPY (EGD);  Surgeon: Rogene Houston, MD;  Location: [facility];  Service: Endoscopy;  Laterality: N/A;  possible  . HAND ARTHROPLASTY    . LUMBAR SPINE SURGERY  2005  . TONSILLECTOMY       Objective: Blood pressure (!) 112/58, pulse 70, temperature 99.1 F (37.3 C), temperature source Oral, resp. rate 18, height 5\' 5"  (1.651 m), weight 175 lb 3.2 oz (79.5 kg). Patient is alert and appears to be comfortable sitting in a wheelchair. She has exopthalmos Conjunctiva is pink. Sclera is nonicteric Oropharyngeal mucosa is normal. No neck masses or thyromegaly noted. Cardiac exam with regular rhythm normal S1 and S2. No murmur or gallop noted. Lungs are clear to auscultation. Abdomen is symmetrical soft and nontender without organomegaly or masses. No LE edema or clubbing noted.  Labs/studies Results:  TSH was 1.130 on 05/18/2017.  Assessment:  #1.  Chronic constipation most likely secondary to medications and limited physical activity.  She needs to take polyethylene glycol or MiraLAX on schedule rather than on a as needed basis before considering other treatment  options. She has a history of colonic adenomas and is up-to-date on surveillance schedule.  #2.  GERD.  Heartburn is well controlled.  She has frequent burping which he possibly is due to aerophagia.    #3.  Single episode of vomiting 2 nights ago.  If vomiting recurs she may need further evaluation.   Plan:  Continue high fiber diet. Increase psyllium dose to 1 g p.o. twice daily. Take polyethylene glycol 8.5 to 17 g p.o. Daily. Can use Dulcolax suppository every other day or on as-needed basis. Continue pantoprazole at current dose. Notify if vomiting recurs. Stool diary as to frequency and consistency of stools and on days when she uses suppository.  She will provide summary in 8 weeks. May change medication based on stool diary. Office visit  in 4 months.

## 2017-12-20 ENCOUNTER — Encounter (HOSPITAL_COMMUNITY): Payer: Self-pay | Admitting: Emergency Medicine

## 2017-12-20 ENCOUNTER — Emergency Department (HOSPITAL_COMMUNITY): Payer: Medicare Other

## 2017-12-20 ENCOUNTER — Inpatient Hospital Stay (HOSPITAL_COMMUNITY)
Admission: EM | Admit: 2017-12-20 | Discharge: 2017-12-24 | DRG: 872 | Disposition: A | Discharge status: Home Health | Payer: Medicare Other | Attending: Family Medicine | Admitting: Family Medicine

## 2017-12-20 ENCOUNTER — Telehealth: Payer: Self-pay | Admitting: *Deleted

## 2017-12-20 ENCOUNTER — Other Ambulatory Visit: Payer: Self-pay

## 2017-12-20 DIAGNOSIS — D709 Neutropenia, unspecified: Secondary | ICD-10-CM | POA: Diagnosis not present

## 2017-12-20 DIAGNOSIS — A419 Sepsis, unspecified organism: Secondary | ICD-10-CM | POA: Diagnosis not present

## 2017-12-20 DIAGNOSIS — Z90710 Acquired absence of both cervix and uterus: Secondary | ICD-10-CM | POA: Diagnosis not present

## 2017-12-20 DIAGNOSIS — K5909 Other constipation: Secondary | ICD-10-CM | POA: Diagnosis present

## 2017-12-20 DIAGNOSIS — Z888 Allergy status to other drugs, medicaments and biological substances status: Secondary | ICD-10-CM

## 2017-12-20 DIAGNOSIS — D696 Thrombocytopenia, unspecified: Secondary | ICD-10-CM | POA: Diagnosis not present

## 2017-12-20 DIAGNOSIS — J810 Acute pulmonary edema: Secondary | ICD-10-CM | POA: Diagnosis not present

## 2017-12-20 DIAGNOSIS — R5381 Other malaise: Secondary | ICD-10-CM | POA: Diagnosis not present

## 2017-12-20 DIAGNOSIS — G629 Polyneuropathy, unspecified: Secondary | ICD-10-CM | POA: Diagnosis present

## 2017-12-20 DIAGNOSIS — R7989 Other specified abnormal findings of blood chemistry: Secondary | ICD-10-CM | POA: Diagnosis present

## 2017-12-20 DIAGNOSIS — Z79890 Hormone replacement therapy (postmenopausal): Secondary | ICD-10-CM | POA: Diagnosis not present

## 2017-12-20 DIAGNOSIS — J811 Chronic pulmonary edema: Secondary | ICD-10-CM | POA: Diagnosis present

## 2017-12-20 DIAGNOSIS — R945 Abnormal results of liver function studies: Secondary | ICD-10-CM | POA: Diagnosis not present

## 2017-12-20 DIAGNOSIS — T7589XA Other specified effects of external causes, initial encounter: Secondary | ICD-10-CM | POA: Diagnosis present

## 2017-12-20 DIAGNOSIS — Z9049 Acquired absence of other specified parts of digestive tract: Secondary | ICD-10-CM

## 2017-12-20 DIAGNOSIS — M0579 Rheumatoid arthritis with rheumatoid factor of multiple sites without organ or systems involvement: Secondary | ICD-10-CM | POA: Diagnosis not present

## 2017-12-20 DIAGNOSIS — A770 Spotted fever due to Rickettsia rickettsii: Secondary | ICD-10-CM

## 2017-12-20 DIAGNOSIS — G894 Chronic pain syndrome: Secondary | ICD-10-CM | POA: Diagnosis present

## 2017-12-20 DIAGNOSIS — Z933 Colostomy status: Secondary | ICD-10-CM | POA: Diagnosis not present

## 2017-12-20 DIAGNOSIS — Z9089 Acquired absence of other organs: Secondary | ICD-10-CM | POA: Diagnosis not present

## 2017-12-20 DIAGNOSIS — Z9102 Food additives allergy status: Secondary | ICD-10-CM

## 2017-12-20 DIAGNOSIS — M549 Dorsalgia, unspecified: Secondary | ICD-10-CM

## 2017-12-20 DIAGNOSIS — M5136 Other intervertebral disc degeneration, lumbar region: Secondary | ICD-10-CM | POA: Diagnosis present

## 2017-12-20 DIAGNOSIS — E871 Hypo-osmolality and hyponatremia: Secondary | ICD-10-CM | POA: Diagnosis not present

## 2017-12-20 DIAGNOSIS — B372 Candidiasis of skin and nail: Secondary | ICD-10-CM | POA: Diagnosis not present

## 2017-12-20 DIAGNOSIS — M48061 Spinal stenosis, lumbar region without neurogenic claudication: Secondary | ICD-10-CM | POA: Diagnosis not present

## 2017-12-20 DIAGNOSIS — E039 Hypothyroidism, unspecified: Secondary | ICD-10-CM | POA: Diagnosis present

## 2017-12-20 DIAGNOSIS — W57XXXA Bitten or stung by nonvenomous insect and other nonvenomous arthropods, initial encounter: Secondary | ICD-10-CM | POA: Diagnosis present

## 2017-12-20 DIAGNOSIS — M069 Rheumatoid arthritis, unspecified: Secondary | ICD-10-CM | POA: Diagnosis present

## 2017-12-20 DIAGNOSIS — Z885 Allergy status to narcotic agent status: Secondary | ICD-10-CM

## 2017-12-20 DIAGNOSIS — Z8673 Personal history of transient ischemic attack (TIA), and cerebral infarction without residual deficits: Secondary | ICD-10-CM | POA: Diagnosis not present

## 2017-12-20 DIAGNOSIS — I10 Essential (primary) hypertension: Secondary | ICD-10-CM | POA: Diagnosis not present

## 2017-12-20 DIAGNOSIS — Z79891 Long term (current) use of opiate analgesic: Secondary | ICD-10-CM

## 2017-12-20 DIAGNOSIS — Z8249 Family history of ischemic heart disease and other diseases of the circulatory system: Secondary | ICD-10-CM

## 2017-12-20 DIAGNOSIS — K219 Gastro-esophageal reflux disease without esophagitis: Secondary | ICD-10-CM | POA: Diagnosis present

## 2017-12-20 DIAGNOSIS — K8020 Calculus of gallbladder without cholecystitis without obstruction: Secondary | ICD-10-CM | POA: Diagnosis not present

## 2017-12-20 DIAGNOSIS — Z7952 Long term (current) use of systemic steroids: Secondary | ICD-10-CM | POA: Diagnosis not present

## 2017-12-20 DIAGNOSIS — K760 Fatty (change of) liver, not elsewhere classified: Secondary | ICD-10-CM | POA: Diagnosis present

## 2017-12-20 DIAGNOSIS — Z91048 Other nonmedicinal substance allergy status: Secondary | ICD-10-CM

## 2017-12-20 DIAGNOSIS — M545 Low back pain: Secondary | ICD-10-CM | POA: Diagnosis not present

## 2017-12-20 DIAGNOSIS — D72819 Decreased white blood cell count, unspecified: Secondary | ICD-10-CM | POA: Diagnosis present

## 2017-12-20 DIAGNOSIS — K5900 Constipation, unspecified: Secondary | ICD-10-CM | POA: Diagnosis not present

## 2017-12-20 DIAGNOSIS — Z79899 Other long term (current) drug therapy: Secondary | ICD-10-CM

## 2017-12-20 DIAGNOSIS — E038 Other specified hypothyroidism: Secondary | ICD-10-CM | POA: Diagnosis not present

## 2017-12-20 DIAGNOSIS — J90 Pleural effusion, not elsewhere classified: Secondary | ICD-10-CM | POA: Diagnosis not present

## 2017-12-20 DIAGNOSIS — R7401 Elevation of levels of liver transaminase levels: Secondary | ICD-10-CM

## 2017-12-20 DIAGNOSIS — I509 Heart failure, unspecified: Secondary | ICD-10-CM | POA: Diagnosis not present

## 2017-12-20 DIAGNOSIS — R740 Nonspecific elevation of levels of transaminase and lactic acid dehydrogenase [LDH]: Secondary | ICD-10-CM

## 2017-12-20 LAB — URINALYSIS, ROUTINE W REFLEX MICROSCOPIC
Bilirubin Urine: NEGATIVE
Glucose, UA: NEGATIVE mg/dL
HGB URINE DIPSTICK: NEGATIVE
Ketones, ur: NEGATIVE mg/dL
Leukocytes, UA: NEGATIVE
Nitrite: NEGATIVE
PROTEIN: 30 mg/dL — AB
Specific Gravity, Urine: 1.024 (ref 1.005–1.030)
pH: 5 (ref 5.0–8.0)

## 2017-12-20 LAB — CBC
HEMATOCRIT: 43.3 % (ref 36.0–46.0)
HEMOGLOBIN: 14.1 g/dL (ref 12.0–15.0)
MCH: 30.3 pg (ref 26.0–34.0)
MCHC: 32.6 g/dL (ref 30.0–36.0)
MCV: 93.1 fL (ref 78.0–100.0)
Platelets: 69 10*3/uL — ABNORMAL LOW (ref 150–400)
RBC: 4.65 MIL/uL (ref 3.87–5.11)
RDW: 14.2 % (ref 11.5–15.5)
WBC: 2.6 10*3/uL — ABNORMAL LOW (ref 4.0–10.5)

## 2017-12-20 LAB — COMPREHENSIVE METABOLIC PANEL
ALBUMIN: 3.4 g/dL — AB (ref 3.5–5.0)
ALT: 135 U/L — ABNORMAL HIGH (ref 14–54)
ANION GAP: 12 (ref 5–15)
AST: 214 U/L — ABNORMAL HIGH (ref 15–41)
Alkaline Phosphatase: 181 U/L — ABNORMAL HIGH (ref 38–126)
BUN: 20 mg/dL (ref 6–20)
CALCIUM: 8.5 mg/dL — AB (ref 8.9–10.3)
CHLORIDE: 93 mmol/L — AB (ref 101–111)
CO2: 27 mmol/L (ref 22–32)
Creatinine, Ser: 0.88 mg/dL (ref 0.44–1.00)
GFR calc non Af Amer: >60 mL/min (ref >60)
Glucose, Bld: 99 mg/dL (ref 65–99)
POTASSIUM: 3.8 mmol/L (ref 3.5–5.1)
Sodium: 132 mmol/L — ABNORMAL LOW (ref 135–145)
Total Bilirubin: 0.7 mg/dL (ref 0.3–1.2)
Total Protein: 6.4 g/dL — ABNORMAL LOW (ref 6.5–8.1)

## 2017-12-20 LAB — I-STAT CG4 LACTIC ACID, ED
LACTIC ACID, VENOUS: 2.08 mmol/L — AB (ref 0.5–1.9)
LACTIC ACID, VENOUS: 1.52 mmol/L (ref 0.5–1.9)

## 2017-12-20 LAB — LIPASE, BLOOD: LIPASE: 32 U/L (ref 11–51)

## 2017-12-20 MED ORDER — SODIUM CHLORIDE 0.9 % IV SOLN: INTRAVENOUS | Status: DC

## 2017-12-20 MED ORDER — TIMOLOL MALEATE 0.5 % OP SOLN
1.0000 [drp] | Freq: Every day | OPHTHALMIC | Status: DC
  Administered 2017-12-22 – 2017-12-24 (×3): 1 [drp] via OPHTHALMIC
  Filled 2017-12-20: qty 5

## 2017-12-20 MED ORDER — POLYETHYLENE GLYCOL 3350 17 G PO PACK: 17.0000 g | PACK | Freq: Every day | ORAL | Status: DC

## 2017-12-20 MED ORDER — LEVOTHYROXINE SODIUM 112 MCG PO TABS
112.0000 ug | ORAL_TABLET | Freq: Every day | ORAL | Status: DC
  Administered 2017-12-21 – 2017-12-24 (×4): 112 ug via ORAL
  Filled 2017-12-20 (×4): qty 1

## 2017-12-20 MED ORDER — DOXYCYCLINE HYCLATE 100 MG IV SOLR
100.0000 mg | Freq: Two times a day (BID) | INTRAVENOUS | Status: AC
  Administered 2017-12-21 – 2017-12-23 (×6): 100 mg via INTRAVENOUS
  Filled 2017-12-20 (×7): qty 100

## 2017-12-20 MED ORDER — PSYLLIUM 95 % PO PACK
1.0000 | PACK | Freq: Two times a day (BID) | ORAL | Status: DC
  Administered 2017-12-21 – 2017-12-24 (×7): 1 via ORAL
  Filled 2017-12-20 (×7): qty 1

## 2017-12-20 MED ORDER — ADULT MULTIVITAMIN W/MINERALS CH
1.0000 | ORAL_TABLET | Freq: Every day | ORAL | Status: DC
  Administered 2017-12-21 – 2017-12-24 (×4): 1 via ORAL
  Filled 2017-12-20 (×4): qty 1

## 2017-12-20 MED ORDER — GABAPENTIN 300 MG PO CAPS
300.0000 mg | ORAL_CAPSULE | Freq: Two times a day (BID) | ORAL | Status: DC
  Administered 2017-12-21 – 2017-12-24 (×7): 300 mg via ORAL
  Filled 2017-12-20 (×7): qty 1

## 2017-12-20 MED ORDER — FENTANYL CITRATE (PF) 100 MCG/2ML IJ SOLN
50.0000 ug | Freq: Once | INTRAMUSCULAR | Status: AC
  Administered 2017-12-20: 50 ug via INTRAVENOUS
  Filled 2017-12-20: qty 2

## 2017-12-20 MED ORDER — SODIUM CHLORIDE 0.9 % IV BOLUS
2000.0000 mL | Freq: Once | INTRAVENOUS | Status: AC
  Administered 2017-12-20: 2000 mL via INTRAVENOUS

## 2017-12-20 MED ORDER — TEMAZEPAM 15 MG PO CAPS
30.0000 mg | ORAL_CAPSULE | Freq: Every evening | ORAL | Status: DC | PRN
  Filled 2017-12-20: qty 2

## 2017-12-20 MED ORDER — FENTANYL CITRATE (PF) 100 MCG/2ML IJ SOLN
100.0000 ug | Freq: Once | INTRAMUSCULAR | Status: AC
  Administered 2017-12-20: 100 ug via INTRAVENOUS
  Filled 2017-12-20: qty 2

## 2017-12-20 MED ORDER — HYDROCODONE-ACETAMINOPHEN 5-325 MG PO TABS: 1.0000 | ORAL_TABLET | ORAL | Status: DC | PRN

## 2017-12-20 MED ORDER — TIZANIDINE HCL 2 MG PO TABS
2.0000 mg | ORAL_TABLET | Freq: Three times a day (TID) | ORAL | Status: DC | PRN
  Administered 2017-12-22: 2 mg via ORAL
  Filled 2017-12-20: qty 1

## 2017-12-20 MED ORDER — ONDANSETRON HCL 4 MG PO TABS: 4.0000 mg | ORAL_TABLET | Freq: Four times a day (QID) | ORAL | Status: DC | PRN

## 2017-12-20 MED ORDER — PANTOPRAZOLE SODIUM 40 MG PO TBEC
40.0000 mg | DELAYED_RELEASE_TABLET | Freq: Every day | ORAL | Status: DC
  Administered 2017-12-21 – 2017-12-24 (×4): 40 mg via ORAL
  Filled 2017-12-20 (×4): qty 1

## 2017-12-20 MED ORDER — PREDNISONE 5 MG PO TABS
7.5000 mg | ORAL_TABLET | Freq: Every day | ORAL | Status: DC
  Filled 2017-12-20: qty 1

## 2017-12-20 MED ORDER — BISACODYL 10 MG RE SUPP
10.0000 mg | Freq: Every day | RECTAL | Status: DC | PRN
  Administered 2017-12-21 – 2017-12-23 (×2): 10 mg via RECTAL
  Filled 2017-12-20 (×2): qty 1

## 2017-12-20 MED ORDER — ACETAMINOPHEN 325 MG PO TABS
650.0000 mg | ORAL_TABLET | Freq: Once | ORAL | Status: AC
  Administered 2017-12-20: 650 mg via ORAL
  Filled 2017-12-20: qty 2

## 2017-12-20 MED ORDER — ONDANSETRON HCL 4 MG/2ML IJ SOLN
4.0000 mg | Freq: Once | INTRAMUSCULAR | Status: AC
  Administered 2017-12-20: 4 mg via INTRAVENOUS
  Filled 2017-12-20: qty 2

## 2017-12-20 MED ORDER — DOCUSATE SODIUM 100 MG PO CAPS
200.0000 mg | ORAL_CAPSULE | Freq: Every day | ORAL | Status: DC
Start: 2017-12-20 — End: 2017-12-24
  Administered 2017-12-21: 200 mg via ORAL
  Administered 2017-12-22: 100 mg via ORAL
  Administered 2017-12-23: 200 mg via ORAL
  Filled 2017-12-20 (×3): qty 2

## 2017-12-20 MED ORDER — B COMPLEX-C PO TABS
1.0000 | ORAL_TABLET | Freq: Every day | ORAL | Status: DC
  Administered 2017-12-21 – 2017-12-24 (×4): 1 via ORAL
  Filled 2017-12-20 (×4): qty 1

## 2017-12-20 MED ORDER — GADOBENATE DIMEGLUMINE 529 MG/ML IV SOLN
15.0000 mL | Freq: Once | INTRAVENOUS | Status: AC | PRN
  Administered 2017-12-20: 15 mL via INTRAVENOUS

## 2017-12-20 MED ORDER — HYDROMORPHONE HCL 1 MG/ML IJ SOLN
0.5000 mg | INTRAMUSCULAR | Status: DC | PRN
  Administered 2017-12-20: 0.5 mg via INTRAVENOUS
  Administered 2017-12-21: 1 mg via INTRAVENOUS
  Filled 2017-12-20 (×2): qty 1

## 2017-12-20 MED ORDER — ACETAMINOPHEN 650 MG RE SUPP: 650.0000 mg | Freq: Four times a day (QID) | RECTAL | Status: DC | PRN

## 2017-12-20 MED ORDER — SODIUM CHLORIDE 0.9 % IV SOLN
100.0000 mg | Freq: Once | INTRAVENOUS | Status: AC
  Administered 2017-12-20: 100 mg via INTRAVENOUS
  Filled 2017-12-20 (×2): qty 100

## 2017-12-20 MED ORDER — ONDANSETRON HCL 4 MG/2ML IJ SOLN
4.0000 mg | Freq: Four times a day (QID) | INTRAMUSCULAR | Status: DC | PRN
  Administered 2017-12-21: 4 mg via INTRAVENOUS
  Filled 2017-12-20: qty 2

## 2017-12-20 MED ORDER — AMLODIPINE BESYLATE 5 MG PO TABS
10.0000 mg | ORAL_TABLET | Freq: Every day | ORAL | Status: DC
  Administered 2017-12-21 – 2017-12-24 (×4): 10 mg via ORAL
  Filled 2017-12-20 (×4): qty 2

## 2017-12-20 MED ORDER — POLYVINYL ALCOHOL 1.4 % OP SOLN
1.0000 [drp] | Freq: Two times a day (BID) | OPHTHALMIC | Status: DC
  Administered 2017-12-21 – 2017-12-24 (×7): 1 [drp] via OPHTHALMIC
  Filled 2017-12-20 (×3): qty 15

## 2017-12-20 MED ORDER — PROMETHAZINE HCL 25 MG/ML IJ SOLN
12.5000 mg | Freq: Four times a day (QID) | INTRAMUSCULAR | Status: DC | PRN
  Administered 2017-12-21: 12.5 mg via INTRAVENOUS
  Filled 2017-12-20: qty 1

## 2017-12-20 MED ORDER — ACETAMINOPHEN 325 MG PO TABS
650.0000 mg | ORAL_TABLET | Freq: Four times a day (QID) | ORAL | Status: DC | PRN
Start: 2017-12-20 — End: 2017-12-24
  Administered 2017-12-21: 650 mg via ORAL
  Filled 2017-12-20: qty 2

## 2017-12-20 MED ORDER — FLEET ENEMA 7-19 GM/118ML RE ENEM: 1.0000 | ENEMA | Freq: Once | RECTAL | Status: DC | PRN

## 2017-12-20 NOTE — H&P (Signed)
History and Physical    Sabrina Adams:[id number] DOB: 24-May-1946 DOA: 12/20/2017  PCP: Kathyrn Drown, MD   Patient coming from: Home  Chief Complaint: Headache, fever, nausea, malaise   HPI: Sabrina Adams is a 72 y.o. female with medical history significant for rheumatoid arthritis, hypertension, hypothyroidism, chronic back pain, and chronic constipation, now presenting to the emergency department for evaluation of fevers, headache, nausea, and malaise.  Patient removed a tick from the medial aspect of her right upper extremity approximately 3 weeks ago, has been dealing with acute on chronic constipation since that time, and then developed headache with subjective fevers, nausea, and general malaise a few days ago.  Her condition worsened today with increased fever, severe nausea without much vomiting, and persistent headache.  She denies change in vision or hearing, and denies focal numbness, weakness, or confusion.  She denies cough, shortness of breath, or dysuria.  Denies rash.  ED Course: Upon arrival to the ED, patient is found to be febrile to 39.3 C slightly tachycardic, and with vitals otherwise, stable.  Chemistry panel is notable for a hyponatremia to 132 and elevations in transaminases with AST 214 and ALT 135.  CBC is notable for leukopenia to 2600 and a thrombocytopenia with platelets 69,000.  Lactic acid is mildly elevated to 2.08.  Blood cultures were collected, RMSF and Lyme serologies were sent, 2 L of normal saline was administered, and the patient was treated with Tylenol, doxycycline, fentanyl, and Zofran in the ED.  She remains hemodynamically stable and will be admitted to the medical-surgical unit for ongoing evaluation and management of suspected RMSF.  Review of Systems:  All other systems reviewed and apart from HPI, are negative.  Past Medical History:  Diagnosis Date  . Anemia   . Arthritis    rheumatoid  . Complication of anesthesia    nausea ,   psycotics episodes  . GERD (gastroesophageal reflux disease)   . Hypertension   . Hypothyroidism   . Neuromuscular disorder (HCC)    neuropathy  legs  . PONV (postoperative nausea and vomiting)   . RA (rheumatoid arthritis) ([place])   . Sleep apnea   . Stroke [facility])    mild    Past Surgical History:  Procedure Laterality Date  . ABDOMINAL HYSTERECTOMY  2005  . ABDOMINAL SURGERY     2006 colectomy for diverticulitis  . ANKLE SURGERY    . COLON SURGERY     colostomy then revision  . COLONOSCOPY  04/04/2012   Procedure: COLONOSCOPY;  Surgeon: Rogene Houston, MD;  Location[facility];  Service: Endoscopy;  Laterality: N/A;  410-Ok per [place]  . COLONOSCOPY N/A 03/23/2016   Procedure: COLONOSCOPY;  Surgeon: Rogene Houston, MD;  Location: [facility] END[facility];  Service: Endoscopy;  Laterality: N/A;  2:50  . ESOPHAGOGASTRODUODENOSCOPY  04/04/2012   Procedure: ESOPHAGOGASTRODUODENOSCOPY (EGD);  Surgeon: Rogene Houston, MD;  Location: [facility];  Service: Endoscopy;  Laterality: N/A;  possible  . HAND ARTHROPLASTY    . LUMBAR SPINE SURGERY  2005  . TONSILLECTOMY       reports that she has never smoked. She has never used smokeless tobacco. She reports that she drinks alcohol. She reports that she does not use drugs.  Allergies  Allergen Reactions  . Betadine [Povidone Iodine] Itching    "Burns"  . Cinnamon Anaphylaxis    Throat and Tongue   . Codeine Nausea Only    Patient can become woozy  .  Other Nausea And Vomiting and Other (See Comments)    Pt states "all narcotic pain medications make me sick to my stomach and sometimes make me woozy  . Actonel [Risedronate Sodium]     stomach  . Adhesive [Tape] Other (See Comments)    Irritates skin   . Lidocaine   . Methotrexate Derivatives     Elevated liver enzymes    Family History  Problem Relation Age of Onset  . Hypertension Mother   . Hypertension Father   . Heart attack Father   . Heart disease Father      Prior to  Admission medications   Medication Sig Start Date End Date Taking? Authorizing Provider  acetaminophen (TYLENOL) 500 MG tablet Take 500 mg by mouth 2 (two) times daily.     [provider]  acetaminophen (TYLENOL) 650 MG CR tablet Take 650 mg by mouth 2 (two) times daily.     [provider]  amLODipine (NORVASC) 10 MG tablet Take 1 tablet (10 mg total) by mouth daily. 05/16/17   Kathyrn Drown, MD  b complex vitamins tablet Take 1 tablet by mouth daily.    [provider]  Calcium Carbonate-Vitamin D (CALCIUM 600 + D PO) Take 1 tablet by mouth daily.     [provider]  Cholecalciferol (VITAMIN D3) 5000 UNITS TABS Take by mouth. Once a week    [provider]  docusate sodium (COLACE) 100 MG capsule Take 200 mg by mouth at bedtime.    [provider]  gabapentin (NEURONTIN) 300 MG capsule TAKE 1 CAPSULE three TIMES DAILY 09/13/17   Mikey Kirschner, MD  HYDROmorphone (DILAUDID) 2 MG tablet One po four times daily as needed 04/19/17   Kathyrn Drown, MD  hydroxychloroquine (PLAQUENIL) 200 MG tablet Take 1 tablet (200 mg total) by mouth 2 (two) times daily. 08/24/14   Kathyrn Drown, MD  levothyroxine (SYNTHROID, LEVOTHROID) 112 MCG tablet Take 1 tablet (112 mcg total) by mouth daily. 05/16/17   Kathyrn Drown, MD  Multiple Vitamin (MULTIVITAMIN WITH MINERALS) TABS Take 1 tablet by mouth daily.    [provider]  pantoprazole (PROTONIX) 40 MG tablet Take 1 tablet (40 mg total) by mouth daily. 09/13/17   Mikey Kirschner, MD  polyethylene glycol [facility] Floria Raveling) packet Take 17 g by mouth daily. Constipation     [provider]  predniSONE (DELTASONE) 2.5 MG tablet Take 7.5 mg by mouth daily with breakfast.    [provider]  Probiotic Product (PROBIOTIC DAILY PO) Take by mouth daily.    [provider]  promethazine (PHENERGAN) 25 MG tablet TAKE 1/2 TABLET TWICE DAILY AS NEEDED 05/16/17   Kathyrn Drown,  MD  Propylene Glycol (SYSTANE BALANCE OP) Apply to eye 2 (two) times daily. Patient states that she uses 1 drop in both eye.    [provider]  psyllium (REGULOID) 0.52 G capsule Take 1.04 g by mouth 2 (two) times daily. 2 tablets twice daily.    [provider]  temazepam (RESTORIL) 30 MG capsule Take 1 capsule (30 mg total) by mouth at bedtime as needed. Sleep 04/22/13   Kathyrn Drown, MD  timolol (TIMOPTIC) 0.5 % ophthalmic solution Place 1 drop into the left eye daily.  05/06/15   [provider]  tiZANidine (ZANAFLEX) 2 MG tablet Take 1 tablet (2 mg total) by mouth every 8 (eight) hours as needed. 05/16/17   Kathyrn Drown, MD  traMADol (ULTRAM) 50 MG tablet TAKE 1 TABLET BY MOUTH EVERY 6 HOURS AS NEEDED. 12/17/17   Kathyrn Drown, MD  Zoledronic Acid (RECLAST IV) Inject into the vein. yearly    [provider]    Physical Exam: Vitals:   12/20/17 1630 12/20/17 1930 12/20/17 2013 12/20/17 2045  BP: (!) 162/58  (!) 164/54 (!) 158/73  Pulse:   (!) 104 (!) 101  Resp:   20 20  Temp:  (!) 103 F (39.4 C)  (!) 102.7 F (39.3 C)  TempSrc:  Oral  Oral  SpO2:   96% 100%  Weight:      Height:          Constitutional: NAD, calm, in apparent discomfort Eyes: PERTLA, lids and conjunctivae normal ENMT: Mucous membranes are moist. Posterior pharynx clear of any exudate or lesions.   Neck: normal, supple, no masses, no thyromegaly Respiratory: clear to auscultation bilaterally, no wheezing, no crackles. Normal respiratory effort.   Cardiovascular: S1 & S2 heard, regular rate and rhythm. Trace bilateral LE edema. Abdomen: No distension, no tenderness, soft. Bowel sounds active.  Musculoskeletal: no clubbing / cyanosis. No joint deformity upper and lower extremities.   Skin: no significant rashes, lesions, ulcers. Warm, dry, well-perfused. Neurologic: CN 2-12 grossly intact. Sensation to light touch intact. Moving all extremities.  Psychiatric: Alert and  oriented x 3. Calm and cooperative.     Labs on Admission: I have personally reviewed following labs and imaging studies  CBC: Recent Labs  Lab 12/20/17 1335  WBC 2.6*  HGB 14.1  HCT 43.3  MCV 93.1  PLT 69*   Basic Metabolic Panel: Recent Labs  Lab 12/20/17 1335  NA 132*  K 3.8  CL 93*  CO2 27  GLUCOSE 99  BUN 20  CREATININE 0.88  CALCIUM 8.5*   GFR: Estimated Creatinine Clearance: 62.9 mL/min (by C-G formula based on SCr of 0.88 mg/dL). Liver Function Tests: Recent Labs  Lab 12/20/17 1335  AST 214*  ALT 135*  ALKPHOS 181*  BILITOT 0.7  PROT 6.4*  ALBUMIN 3.4*   Recent Labs  Lab 12/20/17 1335  LIPASE 32   No results for input(s): AMMONIA in the last 168 hours. Coagulation Profile: No results for input(s): INR, PROTIME in the last 168 hours. Cardiac Enzymes: No results for input(s): CKTOTAL, CKMB, CKMBINDEX, TROPONINI in the last 168 hours. BNP (last 3 results) No results for input(s): PROBNP in the last 8760 hours. HbA1C: No results for input(s): HGBA1C in the last 72 hours. CBG: No results for input(s): GLUCAP in the last 168 hours. Lipid Profile: No results for input(s): CHOL, HDL, LDLCALC, TRIG, CHOLHDL, LDLDIRECT in the last 72 hours. Thyroid Function Tests: No results for input(s): TSH, T4TOTAL, FREET4, T3FREE, THYROIDAB in the last 72 hours. Anemia Panel: No results for input(s): VITAMINB12, FOLATE, FERRITIN, TIBC, IRON, RETICCTPCT in the last 72 hours. Urine analysis:    Component Value Date/Time   COLORURINE AMBER (A) 12/20/2017 1608   APPEARANCEUR HAZY (A) 12/20/2017 1608   LABSPEC 1.024 12/20/2017 1608   PHURINE 5.0 12/20/2017 1608   GLUCOSEU NEGATIVE 12/20/2017 1608   HGBUR NEGATIVE 12/20/2017 1608   [place] 12/20/2017 1608   KETONESUR NEGATIVE 12/20/2017 1608   PROTEINUR 30 (A) 12/20/2017 1608   NITRITE NEGATIVE 12/20/2017 1608   LEUKOCYTESUR NEGATIVE 12/20/2017 1608   Sepsis  Labs: @LABRCNTIP (procalcitonin:4,lacticidven:4) ) Recent Results (from the past 240 hour(s))  Blood Culture (routine x 2)     Status: None (Preliminary result)  Collection Time: 12/20/17  5:14 PM  Result Value Ref Range Status   Specimen Description BLOOD LEFT WRIST  Final   Special Requests   Final    BOTTLES DRAWN AEROBIC AND ANAEROBIC Blood Culture adequate volume Performed at [facility], [address] [place], [place] [address]    Culture PENDING  Incomplete   Report Status PENDING  Incomplete  Blood Culture (routine x 2)     Status: None (Preliminary result)   Collection Time: 12/20/17  5:15 PM  Result Value Ref Range Status   Specimen Description LEFT ANTECUBITAL  Final   Special Requests   Final    BOTTLES DRAWN AEROBIC AND ANAEROBIC Blood Culture adequate volume Performed at [facility], [address] [place], [place] [address]    Culture PENDING  Incomplete   Report Status PENDING  Incomplete     Radiological Exams on Admission: Mr Thoracic Spine W Wo Contrast  Result Date: 12/20/2017 CLINICAL DATA:  Fever, constipation malaise. History of lumbar spine surgery. EXAM: MRI THORACIC AND LUMBAR SPINE WITHOUT AND WITH CONTRAST TECHNIQUE: Multiplanar and multiecho pulse sequences of the thoracic and lumbar spine were obtained without and with intravenous contrast. CONTRAST:  44mL MULTIHANCE GADOBENATE DIMEGLUMINE 529 MG/ML IV SOLN COMPARISON:  Lumbar spine radiographs January 10, 2017. CT abdomen and pelvis October 13, 2010 and MRI lumbar spine Jan 03, 2005. FINDINGS: MRI THORACIC SPINE FINDINGS-moderately motion degraded examination. ALIGNMENT: Maintenance of the thoracic kyphosis. C7-T1 anterolisthesis. Mild broad dextroscoliosis. VERTEBRAE/DISCS: Vertebral bodies are intact. Moderate to severe C5-6 through T7-8 degenerative discs with mild chronic discogenic endplate changes. No abnormal or acute bone marrow signal. Scattered old Schmorl's nodes. No abnormal osseous or disc  enhancement. CORD: Thoracic spinal cord is normal morphology and signal characteristics. No abnormal cord, leptomeningeal or epidural enhancement. PREVERTEBRAL AND PARASPINAL SOFT TISSUES: Nonacute. Large hiatal hernia. DISC LEVELS: Moderate T11-12 RIGHT central disc protrusion versus extrusion without significant migration, enhancing annular fissure. Mild T11-12 canal stenosis with encroachment upon the traversing RIGHT T12 nerve. No neural foraminal narrowing at any level. MRI LUMBAR SPINE FINDINGS-moderately motion degraded examination. SEGMENTATION: For the purposes of this report, the last well-formed intervertebral disc is reported as L5-S1. ALIGNMENT: Maintained lumbar lordosis. Grade 1 L4-5 anterolisthesis and grade 1 L5-S1 retrolisthesis. Lower lumbar scoliosis better demonstrated on prior radiographs. VERTEBRAE: Vertebral bodies are intact. Severe progressed disc height loss L2-3 through L5-S1 with disc desiccation and moderate to severe chronic discogenic endplate changes. No acute or abnormal bone marrow signal. No abnormal osseous or disc enhancement. CONUS MEDULLARIS AND CAUDA EQUINA: Conus medullaris terminates at T12-L1 and demonstrates normal morphology and signal characteristics. Limited assessment of cauda equina due to patient motion. No abnormal cord, leptomeningeal or epidural enhancement. PARASPINAL AND OTHER SOFT TISSUES: Moderate to severe paraspinal muscle atrophy at and below the level surgical interventions. DISC LEVELS: T12-L1: No disc bulge, canal stenosis nor neural foraminal narrowing. L1-2: Old laminectomies. Mild facet arthropathy without canal stenosis or neural foraminal narrowing. L2-3: Old laminectomies. Small broad-based disc osteophyte complex. No canal stenosis. Mild LEFT neural foraminal narrowing. L3-4: Old laminectomies. Moderate broad-based disc osteophyte complex. Moderate canal stenosis. Mild RIGHT, severe LEFT neural foraminal narrowing. L4-5: Old laminectomies.  Anterolisthesis. Small broad-based disc bulge. Severe facet arthropathy. Mild-to-moderate canal stenosis. Mild neural foraminal narrowing. L5-S1: Retrolisthesis. Small broad-based disc osteophyte complex, moderate facet arthropathy without canal stenosis. Mild RIGHT neural foraminal narrowing. IMPRESSION: MRI thoracic spine: 1. Moderately motion degraded examination. 2. No fracture, malalignment or acute osseous process. No suspicious enhancement.  3. Moderate T11-12 disc protrusion versus extrusion resulting in mild canal stenosis. MRI lumbar spine: 1. Moderately motion degraded examination. 2. No fracture or acute osseous process. No suspicious enhancement. 3. Grade 1 L4-5 anterolisthesis and grade 1 L5-S1 retrolisthesis. Old L1-2 through L4-5 laminectomies. 4. Moderate canal stenosis L3-4, mild to moderate L4-5. 5. Neural foraminal narrowing L2-3 through L5-S1: Severe on the LEFT at L3-4. Electronically Signed   By: Elon Alas M.D.   On: 12/20/2017 18:45   Mr Lumbar Spine W Wo Contrast  Result Date: 12/20/2017 CLINICAL DATA:  Fever, constipation malaise. History of lumbar spine surgery. EXAM: MRI THORACIC AND LUMBAR SPINE WITHOUT AND WITH CONTRAST TECHNIQUE: Multiplanar and multiecho pulse sequences of the thoracic and lumbar spine were obtained without and with intravenous contrast. CONTRAST:  41mL MULTIHANCE GADOBENATE DIMEGLUMINE 529 MG/ML IV SOLN COMPARISON:  Lumbar spine radiographs January 10, 2017. CT abdomen and pelvis October 13, 2010 and MRI lumbar spine Jan 03, 2005. FINDINGS: MRI THORACIC SPINE FINDINGS-moderately motion degraded examination. ALIGNMENT: Maintenance of the thoracic kyphosis. C7-T1 anterolisthesis. Mild broad dextroscoliosis. VERTEBRAE/DISCS: Vertebral bodies are intact. Moderate to severe C5-6 through T7-8 degenerative discs with mild chronic discogenic endplate changes. No abnormal or acute bone marrow signal. Scattered old Schmorl's nodes. No abnormal osseous or disc enhancement.  CORD: Thoracic spinal cord is normal morphology and signal characteristics. No abnormal cord, leptomeningeal or epidural enhancement. PREVERTEBRAL AND PARASPINAL SOFT TISSUES: Nonacute. Large hiatal hernia. DISC LEVELS: Moderate T11-12 RIGHT central disc protrusion versus extrusion without significant migration, enhancing annular fissure. Mild T11-12 canal stenosis with encroachment upon the traversing RIGHT T12 nerve. No neural foraminal narrowing at any level. MRI LUMBAR SPINE FINDINGS-moderately motion degraded examination. SEGMENTATION: For the purposes of this report, the last well-formed intervertebral disc is reported as L5-S1. ALIGNMENT: Maintained lumbar lordosis. Grade 1 L4-5 anterolisthesis and grade 1 L5-S1 retrolisthesis. Lower lumbar scoliosis better demonstrated on prior radiographs. VERTEBRAE: Vertebral bodies are intact. Severe progressed disc height loss L2-3 through L5-S1 with disc desiccation and moderate to severe chronic discogenic endplate changes. No acute or abnormal bone marrow signal. No abnormal osseous or disc enhancement. CONUS MEDULLARIS AND CAUDA EQUINA: Conus medullaris terminates at T12-L1 and demonstrates normal morphology and signal characteristics. Limited assessment of cauda equina due to patient motion. No abnormal cord, leptomeningeal or epidural enhancement. PARASPINAL AND OTHER SOFT TISSUES: Moderate to severe paraspinal muscle atrophy at and below the level surgical interventions. DISC LEVELS: T12-L1: No disc bulge, canal stenosis nor neural foraminal narrowing. L1-2: Old laminectomies. Mild facet arthropathy without canal stenosis or neural foraminal narrowing. L2-3: Old laminectomies. Small broad-based disc osteophyte complex. No canal stenosis. Mild LEFT neural foraminal narrowing. L3-4: Old laminectomies. Moderate broad-based disc osteophyte complex. Moderate canal stenosis. Mild RIGHT, severe LEFT neural foraminal narrowing. L4-5: Old laminectomies. Anterolisthesis.  Small broad-based disc bulge. Severe facet arthropathy. Mild-to-moderate canal stenosis. Mild neural foraminal narrowing. L5-S1: Retrolisthesis. Small broad-based disc osteophyte complex, moderate facet arthropathy without canal stenosis. Mild RIGHT neural foraminal narrowing. IMPRESSION: MRI thoracic spine: 1. Moderately motion degraded examination. 2. No fracture, malalignment or acute osseous process. No suspicious enhancement. 3. Moderate T11-12 disc protrusion versus extrusion resulting in mild canal stenosis. MRI lumbar spine: 1. Moderately motion degraded examination. 2. No fracture or acute osseous process. No suspicious enhancement. 3. Grade 1 L4-5 anterolisthesis and grade 1 L5-S1 retrolisthesis. Old L1-2 through L4-5 laminectomies. 4. Moderate canal stenosis L3-4, mild to moderate L4-5. 5. Neural foraminal narrowing L2-3 through L5-S1: Severe on the LEFT at L3-4. Electronically Signed  By: Elon Alas M.D.   On: 12/20/2017 18:45   Dg Chest Port 1 View  Result Date: 12/20/2017 CLINICAL DATA:  Patient states abdominal pain, nausea, vomiting, and constipation since Sunday. Fever at home reached 103.9. Hx of HTN. EXAM: PORTABLE CHEST 1 VIEW COMPARISON:  10/25/2005 FINDINGS: Heart is enlarged. There are prominent interstitial markings consistent with mild pulmonary edema. Suspect hiatal hernia. IMPRESSION: 1. Cardiomegaly and mild interstitial edema. 2. Hiatal hernia. Electronically Signed   By: Nolon Nations M.D.   On: 12/20/2017 18:42    EKG: Not performed.   Assessment/Plan   1. Suspected RMSF  - Presents with fevers, headache, nausea, and malaise; had tick removed from RUE recently, no rash  - Presenting complaints, with hyponatremia, thrombocytopenia, and elevated transaminases raise suspicion for RMSF  - Blood cultures and RMSF serologies sent from ED and doxycycline started  - No meningismus or encephalopathy  - Continue doxycyline while following cultures, serologies, and  clinical course   2. Leukopenia; thrombocytopenia  - WBC is 2,600 and platelets 69k on admission  - Suspected secondary to RMSF, though primary liver disease considered with elevated LFT's, and Plaquenil could also contribute  - Treat suspected infection, hold Plaquenil, repeat CBC in am    3. Elevated LFT's  - AST 214 and ALT 135 on admission  - No RUQ tenderness or jaundice  - Possibly secondary to RMSF - Treat RMSF, trend LFT's    4. Rheumatoid arthritis  - Stable  - Plaquenil held on admission in light of leukopenia and thrombocytopenia  - Continue low-dose prednisone   5. Hypothyroidism  - Continue Synthroid    6. Chronic back pain  - Stable  - MRI without acute findings    7. Hyponatremia  - Serum sodium is 132 on admission in setting of suspected RMSF  - Treated with 2 liters NS in ED  - Repeat chemistries in am    8. Hypertension  - BP at goal, continue Norvasc     DVT prophylaxis: SCD's  Code Status: Full  Family Communication: Husband updated at bedside Consults called: None Admission status: Inpatient    Vianne Bulls, MD [facility] Pager [phone]  If 7PM-7AM, please contact night-coverage [email or web address] Password [email or web address]  12/20/2017, 9:15 PM

## 2017-12-20 NOTE — ED Provider Notes (Signed)
[facility] EMERGENCY DEPARTMENT Provider Note   CSN: [id number] Arrival date & time: 12/20/17  1255     History   Chief Complaint Chief Complaint  Patient presents with  . Abdominal Pain    HPI Sabrina Adams is a 72 y.o. female.  HPI   Presents for evaluation of constipation, present for several days, unimproved with over-the-counter medications at home.  She also noted fever today at home, and took Tylenol prior to coming here.  She had a tick bite, right axilla, 3 weeks ago.  Her constipation has been present for 2 weeks, without relief, and she saw GI yesterday for it.  She is tried rectal suppository but no rectal enemas.  Fever started about 14 hours ago.  She denies cough, shortness of breath, rash, headache or dizziness.  She has ongoing low back pain, somewhat worse in the last 24 hours.  She has a history of final infection requiring back surgery.  She is taking her usual medications.  There are no other known modifying factors.  Past Medical History:  Diagnosis Date  . Anemia   . Arthritis    rheumatoid  . Complication of anesthesia    nausea ,  psycotics episodes  . GERD (gastroesophageal reflux disease)   . Hypertension   . Hypothyroidism   . Neuromuscular disorder (HCC)    neuropathy  legs  . PONV (postoperative nausea and vomiting)   . RA (rheumatoid arthritis) ([place])   . Sleep apnea   . Stroke [facility])    mild    Patient Active Problem List   Diagnosis Date Noted  . Osteopenia 02/16/2014  . Hyperlipidemia 04/22/2013  . Hypothyroidism 04/22/2013  . GERD (gastroesophageal reflux disease) 01/01/2013  . Rheumatoid arthritis ([place]) 12/20/2012  . Essential hypertension, benign 12/20/2012  . Chronic pain syndrome 12/20/2012    Past Surgical History:  Procedure Laterality Date  . ABDOMINAL HYSTERECTOMY  2005  . ABDOMINAL SURGERY     2006 colectomy for diverticulitis  . ANKLE SURGERY    . COLON SURGERY     colostomy then revision  . COLONOSCOPY   04/04/2012   Procedure: COLONOSCOPY;  Surgeon: Rogene Houston, MD;  Location: [facility] ENDO SUITE;  Service: Endoscopy;  Laterality: N/A;  410-Ok per [place]  . COLONOSCOPY N/A 03/23/2016   Procedure: COLONOSCOPY;  Surgeon: Rogene Houston, MD;  Location: [facility] ENDO SUITE;  Service: Endoscopy;  Laterality: N/A;  2:50  . ESOPHAGOGASTRODUODENOSCOPY  04/04/2012   Procedure: ESOPHAGOGASTRODUODENOSCOPY (EGD);  Surgeon: Rogene Houston, MD;  Location: [facility] ENDO SUITE;  Service: Endoscopy;  Laterality: N/A;  possible  . HAND ARTHROPLASTY    . LUMBAR SPINE SURGERY  2005  . TONSILLECTOMY       OB History   None      Home Medications    Prior to Admission medications   Medication Sig Start Date End Date Taking? Authorizing Provider  acetaminophen (TYLENOL) 500 MG tablet Take 500 mg by mouth 2 (two) times daily.     [provider]  acetaminophen (TYLENOL) 650 MG CR tablet Take 650 mg by mouth 2 (two) times daily.     [provider]  amLODipine (NORVASC) 10 MG tablet Take 1 tablet (10 mg total) by mouth daily. 05/16/17   Kathyrn Drown, MD  b complex vitamins tablet Take 1 tablet by mouth daily.    [provider]  Calcium Carbonate-Vitamin D (CALCIUM 600 + D PO) Take 1 tablet by mouth daily.  [provider]  Cholecalciferol (VITAMIN D3) 5000 UNITS TABS Take by mouth. Once a week    [provider]  docusate sodium (COLACE) 100 MG capsule Take 200 mg by mouth at bedtime.    [provider]  gabapentin (NEURONTIN) 300 MG capsule TAKE 1 CAPSULE three TIMES DAILY 09/13/17   Mikey Kirschner, MD  HYDROmorphone (DILAUDID) 2 MG tablet One po four times daily as needed 04/19/17   Kathyrn Drown, MD  hydroxychloroquine (PLAQUENIL) 200 MG tablet Take 1 tablet (200 mg total) by mouth 2 (two) times daily. 08/24/14   Kathyrn Drown, MD  levothyroxine (SYNTHROID, LEVOTHROID) 112 MCG tablet Take 1 tablet (112 mcg total) by mouth daily. 05/16/17   Kathyrn Drown, MD    Multiple Vitamin (MULTIVITAMIN WITH MINERALS) TABS Take 1 tablet by mouth daily.    [provider]  pantoprazole (PROTONIX) 40 MG tablet Take 1 tablet (40 mg total) by mouth daily. 09/13/17   Mikey Kirschner, MD  polyethylene glycol [facility] / Floria Raveling) packet Take 17 g by mouth daily. Constipation     [provider]  predniSONE (DELTASONE) 2.5 MG tablet Take 7.5 mg by mouth daily with breakfast.    [provider]  Probiotic Product (PROBIOTIC DAILY PO) Take by mouth daily.    [provider]  promethazine (PHENERGAN) 25 MG tablet TAKE 1/2 TABLET TWICE DAILY AS NEEDED 05/16/17   Kathyrn Drown, MD  Propylene Glycol (SYSTANE BALANCE OP) Apply to eye 2 (two) times daily. Patient states that she uses 1 drop in both eye.    [provider]  psyllium (REGULOID) 0.52 G capsule Take 1.04 g by mouth 2 (two) times daily. 2 tablets twice daily.    [provider]  temazepam (RESTORIL) 30 MG capsule Take 1 capsule (30 mg total) by mouth at bedtime as needed. Sleep 04/22/13   Kathyrn Drown, MD  timolol (TIMOPTIC) 0.5 % ophthalmic solution Place 1 drop into the left eye daily.  05/06/15   [provider]  tiZANidine (ZANAFLEX) 2 MG tablet Take 1 tablet (2 mg total) by mouth every 8 (eight) hours as needed. 05/16/17   Kathyrn Drown, MD  traMADol (ULTRAM) 50 MG tablet TAKE 1 TABLET BY MOUTH EVERY 6 HOURS AS NEEDED. 12/17/17   Kathyrn Drown, MD  Zoledronic Acid (RECLAST IV) Inject into the vein. yearly    [provider]    Family History Family History  Problem Relation Age of Onset  . Hypertension Mother   . Hypertension Father   . Heart attack Father   . Heart disease Father     Social History Social History   Tobacco Use  . Smoking status: Never Smoker  . Smokeless tobacco: Never Used  Substance Use Topics  . Alcohol use: Yes    Comment: rarely  . Drug use: No     Allergies   Betadine [povidone iodine];  Cinnamon; Codeine; Other; Actonel [risedronate sodium]; Adhesive [tape]; Lidocaine; and Methotrexate derivatives   Review of Systems Review of Systems  All other systems reviewed and are negative.    Physical Exam Updated Vital Signs BP (!) 162/58   Pulse 99   Temp 100.3 F (37.9 C) (Oral)   Resp 20   Ht 5\' 5"  (1.651 m)   Wt 79.4 kg (175 lb)   SpO2 99%   BMI 29.12 kg/m   Physical Exam  Constitutional: She is oriented to person, place, and time. She appears well-developed  and well-nourished. She appears ill. She appears distressed (Uncomfortable).  HENT:  Head: Normocephalic and atraumatic.  Eyes: Pupils are equal, round, and reactive to light. Conjunctivae and EOM are normal.  Neck: Normal range of motion and phonation normal. Neck supple.  Cardiovascular: Normal rate and regular rhythm.  Pulmonary/Chest: Effort normal and breath sounds normal. She exhibits no tenderness.  Abdominal: Soft. She exhibits no distension. There is no tenderness. There is no guarding.  Genitourinary:  Genitourinary Comments: Normal anus, and rectum.  No stool in rectal vault.  No fecal impaction.  Musculoskeletal: She exhibits no deformity.  Decreased movement lumbar spine, secondary to pain, without palpable tenderness over the lumbar or thoracic spine.  Normal range of motion right arm and shoulder without pain.  Neurological: She is alert and oriented to person, place, and time. She exhibits normal muscle tone.  Skin: Skin is warm and dry.  Tiny reddened area, right lateral axilla, without visible foreign body, localized swelling, fluctuance, or drainage.  No generalized rash.  Psychiatric: She has a normal mood and affect. Her behavior is normal. Judgment and thought content normal.  Nursing note and vitals reviewed.    ED Treatments / Results  Labs (all labs ordered are listed, but only abnormal results are displayed) Labs Reviewed  COMPREHENSIVE METABOLIC PANEL - Abnormal; Notable for  the following components:      Result Value   Sodium 132 (*)    Chloride 93 (*)    Calcium 8.5 (*)    Total Protein 6.4 (*)    Albumin 3.4 (*)    AST 214 (*)    ALT 135 (*)    Alkaline Phosphatase 181 (*)    All other components within normal limits  CBC - Abnormal; Notable for the following components:   WBC 2.6 (*)    Platelets 69 (*)    All other components within normal limits  URINALYSIS, ROUTINE W REFLEX MICROSCOPIC - Abnormal; Notable for the following components:   Color, Urine AMBER (*)    APPearance HAZY (*)    Protein, ur 30 (*)    Bacteria, UA RARE (*)    All other components within normal limits  I-STAT CG4 LACTIC ACID, ED - Abnormal; Notable for the following components:   Lactic Acid, Venous 2.08 (*)    All other components within normal limits  CULTURE, BLOOD (ROUTINE X 2)  CULTURE, BLOOD (ROUTINE X 2)  URINE CULTURE  LIPASE, BLOOD  ROCKY MTN SPOTTED FVR ABS PNL(IGG+IGM)  LYME DISEASE DNA BY PCR(BORRELIA BURG)  I-STAT CG4 LACTIC ACID, ED    EKG None  Radiology Mr Thoracic Spine W Wo Contrast  Result Date: 12/20/2017 CLINICAL DATA:  Fever, constipation malaise. History of lumbar spine surgery. EXAM: MRI THORACIC AND LUMBAR SPINE WITHOUT AND WITH CONTRAST TECHNIQUE: Multiplanar and multiecho pulse sequences of the thoracic and lumbar spine were obtained without and with intravenous contrast. CONTRAST:  14mL MULTIHANCE GADOBENATE DIMEGLUMINE 529 MG/ML IV SOLN COMPARISON:  Lumbar spine radiographs January 10, 2017. CT abdomen and pelvis October 13, 2010 and MRI lumbar spine Jan 03, 2005. FINDINGS: MRI THORACIC SPINE FINDINGS-moderately motion degraded examination. ALIGNMENT: Maintenance of the thoracic kyphosis. C7-T1 anterolisthesis. Mild broad dextroscoliosis. VERTEBRAE/DISCS: Vertebral bodies are intact. Moderate to severe C5-6 through T7-8 degenerative discs with mild chronic discogenic endplate changes. No abnormal or acute bone marrow signal. Scattered old Schmorl's  nodes. No abnormal osseous or disc enhancement. CORD: Thoracic spinal cord is normal morphology and signal characteristics. No abnormal cord, leptomeningeal or  epidural enhancement. PREVERTEBRAL AND PARASPINAL SOFT TISSUES: Nonacute. Large hiatal hernia. DISC LEVELS: Moderate T11-12 RIGHT central disc protrusion versus extrusion without significant migration, enhancing annular fissure. Mild T11-12 canal stenosis with encroachment upon the traversing RIGHT T12 nerve. No neural foraminal narrowing at any level. MRI LUMBAR SPINE FINDINGS-moderately motion degraded examination. SEGMENTATION: For the purposes of this report, the last well-formed intervertebral disc is reported as L5-S1. ALIGNMENT: Maintained lumbar lordosis. Grade 1 L4-5 anterolisthesis and grade 1 L5-S1 retrolisthesis. Lower lumbar scoliosis better demonstrated on prior radiographs. VERTEBRAE: Vertebral bodies are intact. Severe progressed disc height loss L2-3 through L5-S1 with disc desiccation and moderate to severe chronic discogenic endplate changes. No acute or abnormal bone marrow signal. No abnormal osseous or disc enhancement. CONUS MEDULLARIS AND CAUDA EQUINA: Conus medullaris terminates at T12-L1 and demonstrates normal morphology and signal characteristics. Limited assessment of cauda equina due to patient motion. No abnormal cord, leptomeningeal or epidural enhancement. PARASPINAL AND OTHER SOFT TISSUES: Moderate to severe paraspinal muscle atrophy at and below the level surgical interventions. DISC LEVELS: T12-L1: No disc bulge, canal stenosis nor neural foraminal narrowing. L1-2: Old laminectomies. Mild facet arthropathy without canal stenosis or neural foraminal narrowing. L2-3: Old laminectomies. Small broad-based disc osteophyte complex. No canal stenosis. Mild LEFT neural foraminal narrowing. L3-4: Old laminectomies. Moderate broad-based disc osteophyte complex. Moderate canal stenosis. Mild RIGHT, severe LEFT neural foraminal  narrowing. L4-5: Old laminectomies. Anterolisthesis. Small broad-based disc bulge. Severe facet arthropathy. Mild-to-moderate canal stenosis. Mild neural foraminal narrowing. L5-S1: Retrolisthesis. Small broad-based disc osteophyte complex, moderate facet arthropathy without canal stenosis. Mild RIGHT neural foraminal narrowing. IMPRESSION: MRI thoracic spine: 1. Moderately motion degraded examination. 2. No fracture, malalignment or acute osseous process. No suspicious enhancement. 3. Moderate T11-12 disc protrusion versus extrusion resulting in mild canal stenosis. MRI lumbar spine: 1. Moderately motion degraded examination. 2. No fracture or acute osseous process. No suspicious enhancement. 3. Grade 1 L4-5 anterolisthesis and grade 1 L5-S1 retrolisthesis. Old L1-2 through L4-5 laminectomies. 4. Moderate canal stenosis L3-4, mild to moderate L4-5. 5. Neural foraminal narrowing L2-3 through L5-S1: Severe on the LEFT at L3-4. Electronically Signed   By: Elon Alas M.D.   On: 12/20/2017 18:45   Mr Lumbar Spine W Wo Contrast  Result Date: 12/20/2017 CLINICAL DATA:  Fever, constipation malaise. History of lumbar spine surgery. EXAM: MRI THORACIC AND LUMBAR SPINE WITHOUT AND WITH CONTRAST TECHNIQUE: Multiplanar and multiecho pulse sequences of the thoracic and lumbar spine were obtained without and with intravenous contrast. CONTRAST:  41mL MULTIHANCE GADOBENATE DIMEGLUMINE 529 MG/ML IV SOLN COMPARISON:  Lumbar spine radiographs January 10, 2017. CT abdomen and pelvis October 13, 2010 and MRI lumbar spine Jan 03, 2005. FINDINGS: MRI THORACIC SPINE FINDINGS-moderately motion degraded examination. ALIGNMENT: Maintenance of the thoracic kyphosis. C7-T1 anterolisthesis. Mild broad dextroscoliosis. VERTEBRAE/DISCS: Vertebral bodies are intact. Moderate to severe C5-6 through T7-8 degenerative discs with mild chronic discogenic endplate changes. No abnormal or acute bone marrow signal. Scattered old Schmorl's nodes. No  abnormal osseous or disc enhancement. CORD: Thoracic spinal cord is normal morphology and signal characteristics. No abnormal cord, leptomeningeal or epidural enhancement. PREVERTEBRAL AND PARASPINAL SOFT TISSUES: Nonacute. Large hiatal hernia. DISC LEVELS: Moderate T11-12 RIGHT central disc protrusion versus extrusion without significant migration, enhancing annular fissure. Mild T11-12 canal stenosis with encroachment upon the traversing RIGHT T12 nerve. No neural foraminal narrowing at any level. MRI LUMBAR SPINE FINDINGS-moderately motion degraded examination. SEGMENTATION: For the purposes of this report, the last well-formed intervertebral disc is reported as L5-S1. ALIGNMENT:  Maintained lumbar lordosis. Grade 1 L4-5 anterolisthesis and grade 1 L5-S1 retrolisthesis. Lower lumbar scoliosis better demonstrated on prior radiographs. VERTEBRAE: Vertebral bodies are intact. Severe progressed disc height loss L2-3 through L5-S1 with disc desiccation and moderate to severe chronic discogenic endplate changes. No acute or abnormal bone marrow signal. No abnormal osseous or disc enhancement. CONUS MEDULLARIS AND CAUDA EQUINA: Conus medullaris terminates at T12-L1 and demonstrates normal morphology and signal characteristics. Limited assessment of cauda equina due to patient motion. No abnormal cord, leptomeningeal or epidural enhancement. PARASPINAL AND OTHER SOFT TISSUES: Moderate to severe paraspinal muscle atrophy at and below the level surgical interventions. DISC LEVELS: T12-L1: No disc bulge, canal stenosis nor neural foraminal narrowing. L1-2: Old laminectomies. Mild facet arthropathy without canal stenosis or neural foraminal narrowing. L2-3: Old laminectomies. Small broad-based disc osteophyte complex. No canal stenosis. Mild LEFT neural foraminal narrowing. L3-4: Old laminectomies. Moderate broad-based disc osteophyte complex. Moderate canal stenosis. Mild RIGHT, severe LEFT neural foraminal narrowing. L4-5:  Old laminectomies. Anterolisthesis. Small broad-based disc bulge. Severe facet arthropathy. Mild-to-moderate canal stenosis. Mild neural foraminal narrowing. L5-S1: Retrolisthesis. Small broad-based disc osteophyte complex, moderate facet arthropathy without canal stenosis. Mild RIGHT neural foraminal narrowing. IMPRESSION: MRI thoracic spine: 1. Moderately motion degraded examination. 2. No fracture, malalignment or acute osseous process. No suspicious enhancement. 3. Moderate T11-12 disc protrusion versus extrusion resulting in mild canal stenosis. MRI lumbar spine: 1. Moderately motion degraded examination. 2. No fracture or acute osseous process. No suspicious enhancement. 3. Grade 1 L4-5 anterolisthesis and grade 1 L5-S1 retrolisthesis. Old L1-2 through L4-5 laminectomies. 4. Moderate canal stenosis L3-4, mild to moderate L4-5. 5. Neural foraminal narrowing L2-3 through L5-S1: Severe on the LEFT at L3-4. Electronically Signed   By: Elon Alas M.D.   On: 12/20/2017 18:45   Dg Chest Port 1 View  Result Date: 12/20/2017 CLINICAL DATA:  Patient states abdominal pain, nausea, vomiting, and constipation since Sunday. Fever at home reached 103.9. Hx of HTN. EXAM: PORTABLE CHEST 1 VIEW COMPARISON:  10/25/2005 FINDINGS: Heart is enlarged. There are prominent interstitial markings consistent with mild pulmonary edema. Suspect hiatal hernia. IMPRESSION: 1. Cardiomegaly and mild interstitial edema. 2. Hiatal hernia. Electronically Signed   By: Nolon Nations M.D.   On: 12/20/2017 18:42    Procedures .Critical Care Performed by: Daleen Bo, MD Authorized by: Daleen Bo, MD   Critical care provider statement:    Critical care time (minutes):  35   Critical care start time:  12/20/2017 4:00 PM   Critical care end time:  12/20/2017 8:03 PM   Critical care time was exclusive of:  Separately billable procedures and treating other patients   Critical care was necessary to treat or prevent imminent  or life-threatening deterioration of the following conditions:  Sepsis   Critical care was time spent personally by me on the following activities:  Blood draw for specimens, development of treatment plan with patient or surrogate, discussions with consultants, evaluation of patient's response to treatment, examination of patient, obtaining history from patient or surrogate, ordering and performing treatments and interventions, ordering and review of laboratory studies, pulse oximetry, re-evaluation of patient's condition, review of old charts and ordering and review of radiographic studies   (including critical care time)  Medications Ordered in ED Medications  sodium chloride 0.9 % bolus 2,000 mL (has no administration in time range)  acetaminophen (TYLENOL) tablet 650 mg (has no administration in time range)  doxycycline (VIBRAMYCIN) 100 mg in sodium chloride 0.9 % 250 mL IVPB (has  no administration in time range)  fentaNYL (SUBLIMAZE) injection 50 mcg (has no administration in time range)  ondansetron (ZOFRAN) injection 4 mg (has no administration in time range)  fentaNYL (SUBLIMAZE) injection 100 mcg (100 mcg Intravenous Given 12/20/17 1647)  ondansetron (ZOFRAN) injection 4 mg (4 mg Intravenous Given 12/20/17 1647)  gadobenate dimeglumine (MULTIHANCE) injection 15 mL (15 mLs Intravenous Contrast Given 12/20/17 1738)     Initial Impression / Assessment and Plan / ED Course  I have reviewed the triage vital signs and the nursing notes.  Pertinent labs & imaging results that were available during my care of the patient were reviewed by me and considered in my medical decision making (see chart for details).  Clinical Course as of Dec 20 2001  Thu Dec 20, 2017  2000 Mild elevation  I-Stat CG4 Lactic Acid, ED  (not at  [facility])(!!) [EW]  2000 Normal  Urinalysis, Routine w reflex microscopic(!) [EW]  2000 Normal except sodium chloride low, calcium low, total protein low, albumin low,  transaminases mildly elevated.  Comprehensive metabolic panel(!) [EW]  3818 Normal except white count low and platelets low.  CBC(!) [EW]    Clinical Course User Index [EW] Daleen Bo, MD     Patient Vitals for the past 24 hrs:  BP Temp Temp src Pulse Resp SpO2 Height Weight  12/20/17 1630 (!) 162/58 - - - - - - -  12/20/17 1304 - - - - - - 5\' 5"  (1.651 m) 79.4 kg (175 lb)  12/20/17 1303 (!) 127/55 100.3 F (37.9 C) Oral 99 20 99 % - -    8:10 PM Reevaluation with update and discussion. After initial assessment and treatment, an updated evaluation reveals no change in clinical status.  Findings discussed with patient and husband, all questions were answered. Daleen Bo   Medical Decision Making: Constipation, with fever and achiness, and recent tick bite.  Low sodium, and white count, and platelets, are consistent with tick fever.  It is suspected that the patient has [facility] spotted fever.  I doubt meningitis or encephalitis at this time.  Her constipation is likely not involved, and can be treated symptomatically.  CRITICAL CARE-yes Performed by: Daleen Bo  Nursing Notes Reviewed/ Care Coordinated Applicable Imaging Reviewed Interpretation of Laboratory Data incorporated into ED treatment   8:04 PM-Consult complete with hospitalist. Patient case explained and discussed.  He agrees to admit patient for further evaluation and treatment. Call ended at 8:40 PM   Plan: Admit  Final Clinical Impressions(s) / ED Diagnoses   Final diagnoses:  [facility] spotted fever  Constipation, unspecified constipation type  Back pain, unspecified back location, unspecified back pain laterality, unspecified chronicity  Degenerative disc disease, lumbar    ED Discharge Orders    None       Daleen Bo, MD 12/20/17 2043

## 2017-12-20 NOTE — ED Notes (Signed)
CRITICAL VALUE ALERT  Critical Value:  Lactic acid 2.08  Date & Time Notied:  12/20/17 1911  Provider Notified: MD Eulis Foster  Orders Received/Actions taken: none

## 2017-12-20 NOTE — ED Triage Notes (Signed)
Pt reports abd pain for several days and having constipation. Has been able to get "green pea" sized amounts of stool today at 0200. Miralax, prune juice, laxative, stool softeners, magnesium citrate 1/2 bottle, and prunes. Fever at home reached 103.9 and given Tylenol at 0230.

## 2017-12-20 NOTE — ED Notes (Signed)
Patient transported to MRI 

## 2017-12-20 NOTE — ED Notes (Signed)
Dr. Ree Kida of lactic acid

## 2017-12-20 NOTE — Telephone Encounter (Signed)
Husband called and states he thinks pt has a uti, temp last night of 104, slightly disoriented today, feeling miserable. Advised husband to take her to aph. Husband agreed to take her to aph. Triage nurse notified.

## 2017-12-21 ENCOUNTER — Encounter (HOSPITAL_COMMUNITY): Payer: Self-pay | Admitting: Radiology

## 2017-12-21 ENCOUNTER — Inpatient Hospital Stay (HOSPITAL_COMMUNITY): Payer: Medicare Other

## 2017-12-21 DIAGNOSIS — A770 Spotted fever due to Rickettsia rickettsii: Secondary | ICD-10-CM

## 2017-12-21 DIAGNOSIS — I10 Essential (primary) hypertension: Secondary | ICD-10-CM

## 2017-12-21 DIAGNOSIS — E871 Hypo-osmolality and hyponatremia: Secondary | ICD-10-CM

## 2017-12-21 DIAGNOSIS — D696 Thrombocytopenia, unspecified: Secondary | ICD-10-CM

## 2017-12-21 DIAGNOSIS — E038 Other specified hypothyroidism: Secondary | ICD-10-CM

## 2017-12-21 DIAGNOSIS — M0579 Rheumatoid arthritis with rheumatoid factor of multiple sites without organ or systems involvement: Secondary | ICD-10-CM

## 2017-12-21 DIAGNOSIS — D709 Neutropenia, unspecified: Secondary | ICD-10-CM

## 2017-12-21 DIAGNOSIS — R945 Abnormal results of liver function studies: Secondary | ICD-10-CM

## 2017-12-21 DIAGNOSIS — G894 Chronic pain syndrome: Secondary | ICD-10-CM

## 2017-12-21 LAB — COMPREHENSIVE METABOLIC PANEL
ALBUMIN: 2.8 g/dL — AB (ref 3.5–5.0)
ALT: 103 U/L — ABNORMAL HIGH (ref 14–54)
AST: 205 U/L — AB (ref 15–41)
Alkaline Phosphatase: 147 U/L — ABNORMAL HIGH (ref 38–126)
Anion gap: 11 (ref 5–15)
BUN: 16 mg/dL (ref 6–20)
CHLORIDE: 96 mmol/L — AB (ref 101–111)
CO2: 23 mmol/L (ref 22–32)
Calcium: 7.4 mg/dL — ABNORMAL LOW (ref 8.9–10.3)
Creatinine, Ser: 0.71 mg/dL (ref 0.44–1.00)
GFR calc Af Amer: >60 mL/min (ref >60)
GFR calc non Af Amer: >60 mL/min (ref >60)
GLUCOSE: 72 mg/dL (ref 65–99)
Potassium: 3.8 mmol/L (ref 3.5–5.1)
Sodium: 130 mmol/L — ABNORMAL LOW (ref 135–145)
Total Bilirubin: 1 mg/dL (ref 0.3–1.2)
Total Protein: 5.4 g/dL — ABNORMAL LOW (ref 6.5–8.1)

## 2017-12-21 LAB — CBC WITH DIFFERENTIAL/PLATELET
Basophils Absolute: 0 10*3/uL (ref 0.0–0.1)
Basophils Relative: 1 %
Eosinophils Absolute: 0 10*3/uL (ref 0.0–0.7)
Eosinophils Relative: 0 %
HCT: 39.3 % (ref 36.0–46.0)
Hemoglobin: 12.9 g/dL (ref 12.0–15.0)
Lymphocytes Relative: 12 %
Lymphs Abs: 0.4 10*3/uL (ref 0.7–4.0)
MCH: 30.6 pg (ref 26.0–34.0)
MCHC: 32.8 g/dL (ref 30.0–36.0)
MCV: 93.3 fL (ref 78.0–100.0)
Monocytes Absolute: 0.3 10*3/uL (ref 0.1–1.0)
Monocytes Relative: 8 %
Neutro Abs: 2.9 10*3/uL (ref 1.7–7.7)
Neutrophils Relative %: 79 %
Platelets: 63 10*3/uL — ABNORMAL LOW (ref 150–400)
RBC: 4.21 MIL/uL (ref 3.87–5.11)
RDW: 14.5 % (ref 11.5–15.5)
WBC: 3.6 10*3/uL — ABNORMAL LOW (ref 4.0–10.5)

## 2017-12-21 LAB — PROCALCITONIN: Procalcitonin: 2.99 ng/mL

## 2017-12-21 LAB — TYPE AND SCREEN
ABO/RH(D): O POS
ANTIBODY SCREEN: NEGATIVE

## 2017-12-21 LAB — MRSA PCR SCREENING: MRSA by PCR: POSITIVE — AB

## 2017-12-21 MED ORDER — VANCOMYCIN HCL IN DEXTROSE 1-5 GM/200ML-% IV SOLN: 1000.0000 mg | Freq: Once | INTRAVENOUS | Status: DC

## 2017-12-21 MED ORDER — CHLORHEXIDINE GLUCONATE CLOTH 2 % EX PADS
6.0000 | MEDICATED_PAD | Freq: Every day | CUTANEOUS | Status: DC
  Administered 2017-12-22 – 2017-12-24 (×3): 6 via TOPICAL

## 2017-12-21 MED ORDER — POLYETHYLENE GLYCOL 3350 17 G PO PACK
17.0000 g | PACK | Freq: Two times a day (BID) | ORAL | Status: DC
  Administered 2017-12-21 – 2017-12-24 (×7): 17 g via ORAL
  Filled 2017-12-21 (×7): qty 1

## 2017-12-21 MED ORDER — MUPIROCIN 2 % EX OINT
1.0000 "application " | TOPICAL_OINTMENT | Freq: Two times a day (BID) | CUTANEOUS | Status: DC
  Administered 2017-12-21 – 2017-12-24 (×5): 1 via NASAL
  Filled 2017-12-21: qty 22

## 2017-12-21 MED ORDER — IOPAMIDOL (ISOVUE-300) INJECTION 61%
100.0000 mL | Freq: Once | INTRAVENOUS | Status: AC | PRN
  Administered 2017-12-21: 100 mL via INTRAVENOUS

## 2017-12-21 MED ORDER — VANCOMYCIN HCL 10 G IV SOLR
1750.0000 mg | Freq: Once | INTRAVENOUS | Status: AC
  Administered 2017-12-21: 1750 mg via INTRAVENOUS
  Filled 2017-12-21: qty 1750

## 2017-12-21 MED ORDER — HYDROMORPHONE HCL 1 MG/ML IJ SOLN
0.5000 mg | INTRAMUSCULAR | Status: DC | PRN
  Administered 2017-12-21 (×2): 0.5 mg via INTRAVENOUS
  Filled 2017-12-21 (×2): qty 0.5

## 2017-12-21 MED ORDER — PIPERACILLIN-TAZOBACTAM 3.375 G IVPB
3.3750 g | Freq: Once | INTRAVENOUS | Status: AC
  Administered 2017-12-21: 3.375 g via INTRAVENOUS
  Filled 2017-12-21: qty 50

## 2017-12-21 MED ORDER — SODIUM CHLORIDE 0.9 % IV SOLN
INTRAVENOUS | Status: DC
  Administered 2017-12-21: 10:00:00 via INTRAVENOUS

## 2017-12-21 MED ORDER — PREDNISONE 5 MG PO TABS
7.5000 mg | ORAL_TABLET | Freq: Every day | ORAL | Status: DC
  Administered 2017-12-21 – 2017-12-24 (×4): 7.5 mg via ORAL
  Filled 2017-12-21 (×4): qty 2

## 2017-12-21 MED ORDER — FLUCONAZOLE 150 MG PO TABS
150.0000 mg | ORAL_TABLET | Freq: Once | ORAL | Status: AC
  Administered 2017-12-21: 150 mg via ORAL
  Filled 2017-12-21: qty 1

## 2017-12-21 MED ORDER — ORAL CARE MOUTH RINSE
15.0000 mL | Freq: Two times a day (BID) | OROMUCOSAL | Status: DC
  Administered 2017-12-21 – 2017-12-23 (×5): 15 mL via OROMUCOSAL

## 2017-12-21 MED ORDER — HYDROMORPHONE HCL 1 MG/ML IJ SOLN
0.5000 mg | INTRAMUSCULAR | Status: DC | PRN
  Administered 2017-12-22: 0.5 mg via INTRAVENOUS
  Filled 2017-12-21: qty 0.5

## 2017-12-21 MED ORDER — KETOROLAC TROMETHAMINE 30 MG/ML IJ SOLN
30.0000 mg | Freq: Four times a day (QID) | INTRAMUSCULAR | Status: DC | PRN
  Administered 2017-12-22 – 2017-12-24 (×3): 30 mg via INTRAVENOUS
  Filled 2017-12-21 (×4): qty 1

## 2017-12-21 MED ORDER — FUROSEMIDE 10 MG/ML IJ SOLN
40.0000 mg | Freq: Once | INTRAMUSCULAR | Status: AC
  Administered 2017-12-21: 40 mg via INTRAVENOUS
  Filled 2017-12-21: qty 4

## 2017-12-21 MED ORDER — VANCOMYCIN HCL IN DEXTROSE 1-5 GM/200ML-% IV SOLN
1000.0000 mg | Freq: Two times a day (BID) | INTRAVENOUS | Status: AC
  Administered 2017-12-21 – 2017-12-23 (×5): 1000 mg via INTRAVENOUS
  Filled 2017-12-21 (×5): qty 200

## 2017-12-21 MED ORDER — IOPAMIDOL (ISOVUE-300) INJECTION 61%
30.0000 mL | INTRAVENOUS | Status: AC
  Administered 2017-12-21 (×2): 30 mL via ORAL

## 2017-12-21 MED ORDER — PIPERACILLIN-TAZOBACTAM 3.375 G IVPB
3.3750 g | Freq: Three times a day (TID) | INTRAVENOUS | Status: DC
  Administered 2017-12-21 – 2017-12-23 (×6): 3.375 g via INTRAVENOUS
  Filled 2017-12-21 (×6): qty 50

## 2017-12-21 NOTE — Progress Notes (Signed)
Pharmacy Antibiotic Note  Sabrina Adams is a 72 y.o. female admitted on 12/20/2017 with sepsis.  Pharmacy has been consulted for Vancomycin and Zosyn dosing.  D/W Dr Tery Sanfilippo.   Plan: Continue Doxycycline 100mg  IV q12hrs (tick borne illness) Vancomycin 1750mg  x 1 then 1000mg  IV q12hrs Zosyn 3.375gm IV q8hrs, EID Deescalate ABX when deemed appropriate Monitor labs, progress, c/s  Height: 5\' 5"  (165.1 cm) Weight: 175 lb (79.4 kg) IBW/kg (Calculated) : 57  Temp (24hrs), Avg:101.5 F (38.6 C), Min:98.7 F (37.1 C), Max:103.2 F (39.6 C)  Recent Labs  Lab 12/20/17 1335 12/20/17 1821 12/20/17 2052 12/21/17 0507  WBC 2.6*  --   --  3.6*  CREATININE 0.88  --   --  0.71  LATICACIDVEN  --  2.08* 1.52  --     Estimated Creatinine Clearance: 69.2 mL/min (by C-G formula based on SCr of 0.71 mg/dL).    Allergies  Allergen Reactions  . Betadine [Povidone Iodine] Itching    "Burns"  . Cinnamon Anaphylaxis    Throat and Tongue   . Codeine Nausea Only    Patient can become woozy  . Other Nausea And Vomiting and Other (See Comments)    Pt states "all narcotic pain medications make me sick to my stomach and sometimes make me woozy  . Actonel [Risedronate Sodium]     stomach  . Adhesive [Tape] Other (See Comments)    Irritates skin   . Lidocaine   . Methotrexate Derivatives     Elevated liver enzymes   Antimicrobials this admission: Doxy 5/17 >>  Vancomycin 5/17 >>  Zosyn 5/17 >>  Dose adjustments this admission:  Microbiology results: 5/16 BCx: pending 5/16 UCx: pending  5/16 MRSA PCR: POSITIVE  Thank you for allowing pharmacy to be a part of this patient's care.  Hart Robinsons A 12/21/2017 1:31 PM

## 2017-12-21 NOTE — Progress Notes (Addendum)
PROGRESS NOTE    Sabrina Adams  JJK:[id number]  DOB: [birth year]  DOA: 12/20/2017 PCP: Kathyrn Drown, MD   Brief Admission Hx: Sabrina Adams is a 72 y.o. female with medical history significant for rheumatoid arthritis, hypertension, hypothyroidism, chronic back pain, and chronic constipation, now presenting to the emergency department for evaluation of fevers, headache, nausea, and malaise.   MDM/Assessment & Plan:   1. Malaise - Pt presented with symptoms of low grade fever, malaise, nausea in setting of recent tick exposure.  She does not recall a rash.  She is being empirically treated with doxycycline for possible RMSF.  Lab tests are pending.  2. FUO - obtain CT chest / abd / pelvis as part of work up for FUO.  Pt is immunocompromised.  3. Sepsis - lactate trending down with hydration.  Broaden antibiotic coverage pending culture and work up.  4. Leukopenia/thrombocytopenia - Holding plaquenil for now.  Follow CBC.   5. Elevated Transaminases - check Abd [place].  Holding plaquenil.  Follow.  6. RA - reported as stable.  Holding plaquenil temporarily.  Continue home prednisone therapy.  7. Hypothyroidism - Continue synthroid daily. 8. Chronic back pain - stable, MRI done but no acute findings, multiple chronic findings.  Get PT eval.  9. Hyponatremia - treating with IVF hydration.  Recheck in AM.  10. Essential Hypertension - stable. Continue home amlodipine.  11. Chronic constipation - laxatives ordered  DVT prophylaxis: SCDs Code Status: Full  Family Communication: Husband at bedside Disposition Plan: inpatient treatment     Subjective: Pt says she does not feel well and reports that she continues to be constipated.  Objective: Vitals:   12/21/17 0200 12/21/17 0440 12/21/17 0518 12/21/17 0634  BP:   (!) 166/64   Pulse:   (!) 109 (!) 102  Resp:    20  Temp: 100.3 F (37.9 C) (!) 101.9 F (38.8 C) (!) 103.2 F (39.6 C) (!) 101.5 F (38.6 C)  TempSrc: Oral  Oral Oral Oral  SpO2:   92% 91%  Weight:      Height:        Intake/Output Summary (Last 24 hours) at 12/21/2017 0951 Last data filed at 12/20/2017 2133 Gross per 24 hour  Intake 2000 ml  Output -  Net 2000 ml   Filed Weights   12/20/17 1304  Weight: 79.4 kg (175 lb)     REVIEW OF SYSTEMS  As per history otherwise all reviewed and reported negative  Exam:  General exam: awake, alert, NAD. Cooperative.  Respiratory system: Clear. No increased work of breathing. Cardiovascular system: S1 & S2 heard, RRR. No JVD, murmurs, gallops, clicks or pedal edema. Gastrointestinal system: Abdomen is nondistended, soft and nontender. Normal bowel sounds heard. Central nervous system: Alert and oriented. No focal neurological deficits. Extremities: no Clubbing or cyanosis.  Data Reviewed: Basic Metabolic Panel: Recent Labs  Lab 12/20/17 1335 12/21/17 0507  NA 132* 130*  K 3.8 3.8  CL 93* 96*  CO2 27 23  GLUCOSE 99 72  BUN 20 16  CREATININE 0.88 0.71  CALCIUM 8.5* 7.4*   Liver Function Tests: Recent Labs  Lab 12/20/17 1335 12/21/17 0507  AST 214* 205*  ALT 135* 103*  ALKPHOS 181* 147*  BILITOT 0.7 1.0  PROT 6.4* 5.4*  ALBUMIN 3.4* 2.8*   Recent Labs  Lab 12/20/17 1335  LIPASE 32   No results for input(s): AMMONIA in the last 168 hours. CBC: Recent Labs  Lab 12/20/17 1335  12/21/17 0507  WBC 2.6* 3.6*  NEUTROABS  --  2.9  HGB 14.1 12.9  HCT 43.3 39.3  MCV 93.1 93.3  PLT 69* 63*   Cardiac Enzymes: No results for input(s): CKTOTAL, CKMB, CKMBINDEX, TROPONINI in the last 168 hours. CBG (last 3)  No results for input(s): GLUCAP in the last 72 hours. Recent Results (from the past 240 hour(s))  Blood Culture (routine x 2)     Status: None (Preliminary result)   Collection Time: 12/20/17  5:14 PM  Result Value Ref Range Status   Specimen Description BLOOD LEFT WRIST  Final   Special Requests   Final    BOTTLES DRAWN AEROBIC AND ANAEROBIC Blood Culture  adequate volume   Culture   Final    NO GROWTH < 12 HOURS Performed at [facility], [address], [place], [place] [address]    Report Status PENDING  Incomplete  Blood Culture (routine x 2)     Status: None (Preliminary result)   Collection Time: 12/20/17  5:15 PM  Result Value Ref Range Status   Specimen Description LEFT ANTECUBITAL  Final   Special Requests   Final    BOTTLES DRAWN AEROBIC AND ANAEROBIC Blood Culture adequate volume   Culture   Final    NO GROWTH < 12 HOURS Performed at [facility], [address], [place], [place] [address]    Report Status PENDING  Incomplete  MRSA PCR Screening     Status: Abnormal   Collection Time: 12/20/17 11:33 PM  Result Value Ref Range Status   MRSA by PCR POSITIVE (A) NEGATIVE Final    Comment:        The GeneXpert MRSA Assay (FDA approved for NASAL specimens only), is one component of a comprehensive MRSA colonization surveillance program. It is not intended to diagnose MRSA infection nor to guide or monitor treatment for MRSA infections. RESULT CALLED TO, READ BACK BY AND VERIFIED WITH: HOBBS A. AT 0803A ON [id number] BY THOMPSON S. Performed at [facility], [address], [place], [place] [address]      Studies: Mr Thoracic Spine W Wo Contrast  Result Date: 12/20/2017 CLINICAL DATA:  Fever, constipation malaise. History of lumbar spine surgery. EXAM: MRI THORACIC AND LUMBAR SPINE WITHOUT AND WITH CONTRAST TECHNIQUE: Multiplanar and multiecho pulse sequences of the thoracic and lumbar spine were obtained without and with intravenous contrast. CONTRAST:  5mL MULTIHANCE GADOBENATE DIMEGLUMINE 529 MG/ML IV SOLN COMPARISON:  Lumbar spine radiographs January 10, 2017. CT abdomen and pelvis October 13, 2010 and MRI lumbar spine Jan 03, 2005. FINDINGS: MRI THORACIC SPINE FINDINGS-moderately motion degraded examination. ALIGNMENT: Maintenance of the thoracic kyphosis. C7-T1 anterolisthesis. Mild broad dextroscoliosis. VERTEBRAE/DISCS:  Vertebral bodies are intact. Moderate to severe C5-6 through T7-8 degenerative discs with mild chronic discogenic endplate changes. No abnormal or acute bone marrow signal. Scattered old Schmorl's nodes. No abnormal osseous or disc enhancement. CORD: Thoracic spinal cord is normal morphology and signal characteristics. No abnormal cord, leptomeningeal or epidural enhancement. PREVERTEBRAL AND PARASPINAL SOFT TISSUES: Nonacute. Large hiatal hernia. DISC LEVELS: Moderate T11-12 RIGHT central disc protrusion versus extrusion without significant migration, enhancing annular fissure. Mild T11-12 canal stenosis with encroachment upon the traversing RIGHT T12 nerve. No neural foraminal narrowing at any level. MRI LUMBAR SPINE FINDINGS-moderately motion degraded examination. SEGMENTATION: For the purposes of this report, the last well-formed intervertebral disc is reported as L5-S1. ALIGNMENT: Maintained lumbar lordosis. Grade 1 L4-5 anterolisthesis and grade 1 L5-S1 retrolisthesis. Lower lumbar scoliosis better demonstrated on  prior radiographs. VERTEBRAE: Vertebral bodies are intact. Severe progressed disc height loss L2-3 through L5-S1 with disc desiccation and moderate to severe chronic discogenic endplate changes. No acute or abnormal bone marrow signal. No abnormal osseous or disc enhancement. CONUS MEDULLARIS AND CAUDA EQUINA: Conus medullaris terminates at T12-L1 and demonstrates normal morphology and signal characteristics. Limited assessment of cauda equina due to patient motion. No abnormal cord, leptomeningeal or epidural enhancement. PARASPINAL AND OTHER SOFT TISSUES: Moderate to severe paraspinal muscle atrophy at and below the level surgical interventions. DISC LEVELS: T12-L1: No disc bulge, canal stenosis nor neural foraminal narrowing. L1-2: Old laminectomies. Mild facet arthropathy without canal stenosis or neural foraminal narrowing. L2-3: Old laminectomies. Small broad-based disc osteophyte complex. No  canal stenosis. Mild LEFT neural foraminal narrowing. L3-4: Old laminectomies. Moderate broad-based disc osteophyte complex. Moderate canal stenosis. Mild RIGHT, severe LEFT neural foraminal narrowing. L4-5: Old laminectomies. Anterolisthesis. Small broad-based disc bulge. Severe facet arthropathy. Mild-to-moderate canal stenosis. Mild neural foraminal narrowing. L5-S1: Retrolisthesis. Small broad-based disc osteophyte complex, moderate facet arthropathy without canal stenosis. Mild RIGHT neural foraminal narrowing. IMPRESSION: MRI thoracic spine: 1. Moderately motion degraded examination. 2. No fracture, malalignment or acute osseous process. No suspicious enhancement. 3. Moderate T11-12 disc protrusion versus extrusion resulting in mild canal stenosis. MRI lumbar spine: 1. Moderately motion degraded examination. 2. No fracture or acute osseous process. No suspicious enhancement. 3. Grade 1 L4-5 anterolisthesis and grade 1 L5-S1 retrolisthesis. Old L1-2 through L4-5 laminectomies. 4. Moderate canal stenosis L3-4, mild to moderate L4-5. 5. Neural foraminal narrowing L2-3 through L5-S1: Severe on the LEFT at L3-4. Electronically Signed   By: Elon Alas M.D.   On: 12/20/2017 18:45   Mr Lumbar Spine W Wo Contrast  Result Date: 12/20/2017 CLINICAL DATA:  Fever, constipation malaise. History of lumbar spine surgery. EXAM: MRI THORACIC AND LUMBAR SPINE WITHOUT AND WITH CONTRAST TECHNIQUE: Multiplanar and multiecho pulse sequences of the thoracic and lumbar spine were obtained without and with intravenous contrast. CONTRAST:  68mL MULTIHANCE GADOBENATE DIMEGLUMINE 529 MG/ML IV SOLN COMPARISON:  Lumbar spine radiographs January 10, 2017. CT abdomen and pelvis October 13, 2010 and MRI lumbar spine Jan 03, 2005. FINDINGS: MRI THORACIC SPINE FINDINGS-moderately motion degraded examination. ALIGNMENT: Maintenance of the thoracic kyphosis. C7-T1 anterolisthesis. Mild broad dextroscoliosis. VERTEBRAE/DISCS: Vertebral bodies  are intact. Moderate to severe C5-6 through T7-8 degenerative discs with mild chronic discogenic endplate changes. No abnormal or acute bone marrow signal. Scattered old Schmorl's nodes. No abnormal osseous or disc enhancement. CORD: Thoracic spinal cord is normal morphology and signal characteristics. No abnormal cord, leptomeningeal or epidural enhancement. PREVERTEBRAL AND PARASPINAL SOFT TISSUES: Nonacute. Large hiatal hernia. DISC LEVELS: Moderate T11-12 RIGHT central disc protrusion versus extrusion without significant migration, enhancing annular fissure. Mild T11-12 canal stenosis with encroachment upon the traversing RIGHT T12 nerve. No neural foraminal narrowing at any level. MRI LUMBAR SPINE FINDINGS-moderately motion degraded examination. SEGMENTATION: For the purposes of this report, the last well-formed intervertebral disc is reported as L5-S1. ALIGNMENT: Maintained lumbar lordosis. Grade 1 L4-5 anterolisthesis and grade 1 L5-S1 retrolisthesis. Lower lumbar scoliosis better demonstrated on prior radiographs. VERTEBRAE: Vertebral bodies are intact. Severe progressed disc height loss L2-3 through L5-S1 with disc desiccation and moderate to severe chronic discogenic endplate changes. No acute or abnormal bone marrow signal. No abnormal osseous or disc enhancement. CONUS MEDULLARIS AND CAUDA EQUINA: Conus medullaris terminates at T12-L1 and demonstrates normal morphology and signal characteristics. Limited assessment of cauda equina due to patient motion. No abnormal cord, leptomeningeal or  epidural enhancement. PARASPINAL AND OTHER SOFT TISSUES: Moderate to severe paraspinal muscle atrophy at and below the level surgical interventions. DISC LEVELS: T12-L1: No disc bulge, canal stenosis nor neural foraminal narrowing. L1-2: Old laminectomies. Mild facet arthropathy without canal stenosis or neural foraminal narrowing. L2-3: Old laminectomies. Small broad-based disc osteophyte complex. No canal stenosis.  Mild LEFT neural foraminal narrowing. L3-4: Old laminectomies. Moderate broad-based disc osteophyte complex. Moderate canal stenosis. Mild RIGHT, severe LEFT neural foraminal narrowing. L4-5: Old laminectomies. Anterolisthesis. Small broad-based disc bulge. Severe facet arthropathy. Mild-to-moderate canal stenosis. Mild neural foraminal narrowing. L5-S1: Retrolisthesis. Small broad-based disc osteophyte complex, moderate facet arthropathy without canal stenosis. Mild RIGHT neural foraminal narrowing. IMPRESSION: MRI thoracic spine: 1. Moderately motion degraded examination. 2. No fracture, malalignment or acute osseous process. No suspicious enhancement. 3. Moderate T11-12 disc protrusion versus extrusion resulting in mild canal stenosis. MRI lumbar spine: 1. Moderately motion degraded examination. 2. No fracture or acute osseous process. No suspicious enhancement. 3. Grade 1 L4-5 anterolisthesis and grade 1 L5-S1 retrolisthesis. Old L1-2 through L4-5 laminectomies. 4. Moderate canal stenosis L3-4, mild to moderate L4-5. 5. Neural foraminal narrowing L2-3 through L5-S1: Severe on the LEFT at L3-4. Electronically Signed   By: Elon Alas M.D.   On: 12/20/2017 18:45   Dg Chest Port 1 View  Result Date: 12/20/2017 CLINICAL DATA:  Patient states abdominal pain, nausea, vomiting, and constipation since Sunday. Fever at home reached 103.9. Hx of HTN. EXAM: PORTABLE CHEST 1 VIEW COMPARISON:  10/25/2005 FINDINGS: Heart is enlarged. There are prominent interstitial markings consistent with mild pulmonary edema. Suspect hiatal hernia. IMPRESSION: 1. Cardiomegaly and mild interstitial edema. 2. Hiatal hernia. Electronically Signed   By: Nolon Nations M.D.   On: 12/20/2017 18:42     Scheduled Meds: . amLODipine  10 mg Oral Daily  . B-complex with vitamin C  1 tablet Oral Daily  . docusate sodium  200 mg Oral QHS  . gabapentin  300 mg Oral BID  . levothyroxine  112 mcg Oral QAC breakfast  . mouth rinse  15  mL Mouth Rinse BID  . multivitamin with minerals  1 tablet Oral Daily  . pantoprazole  40 mg Oral Daily  . polyethylene glycol  17 g Oral BID  . polyvinyl alcohol  1 drop Both Eyes BID  . predniSONE  7.5 mg Oral Q breakfast  . psyllium  1 packet Oral BID  . timolol  1 drop Left Eye Daily   Continuous Infusions: . sodium chloride    . doxycycline (VIBRAMYCIN) IV      Principal Problem:   [place] spotted fever Active Problems:   Rheumatoid arthritis ([place])   Essential hypertension, benign   Chronic pain syndrome   Hypothyroidism   LFTs abnormal   Leukopenia   Thrombocytopenia ([facility])   Hyponatremia   Time spent:   Irwin Brakeman, MD, FAAFP [facility] Pager [phone]  If 7PM-7AM, please contact night-coverage [email or web address] Password [email or web address] 12/21/2017, 9:51 AM    LOS: 1 day

## 2017-12-21 NOTE — Care Management Important Message (Signed)
Important Message  Patient Details  Name: NAAMA SAPPINGTON MRN: [id number] Date of Birth: [birth year]   Medicare Important Message Given:  Yes    Shelda Altes 12/21/2017, 10:54 AM

## 2017-12-22 ENCOUNTER — Inpatient Hospital Stay (HOSPITAL_COMMUNITY): Payer: Medicare Other

## 2017-12-22 LAB — CBC WITH DIFFERENTIAL/PLATELET
Basophils Absolute: 0.1 10*3/uL (ref 0.0–0.1)
Basophils Relative: 2 %
Eosinophils Absolute: 0 10*3/uL (ref 0.0–0.7)
Eosinophils Relative: 0 %
HEMATOCRIT: 39.8 % (ref 36.0–46.0)
Hemoglobin: 13.1 g/dL (ref 12.0–15.0)
LYMPHS ABS: 1.2 10*3/uL (ref 0.7–4.0)
LYMPHS PCT: 26 %
MCH: 29.8 pg (ref 26.0–34.0)
MCHC: 32.9 g/dL (ref 30.0–36.0)
MCV: 90.5 fL (ref 78.0–100.0)
Monocytes Absolute: 0.3 10*3/uL (ref 0.1–1.0)
Monocytes Relative: 8 %
NEUTROS ABS: 2.9 10*3/uL (ref 1.7–7.7)
NEUTROS PCT: 64 %
Platelets: 78 10*3/uL — ABNORMAL LOW (ref 150–400)
RBC: 4.4 MIL/uL (ref 3.87–5.11)
RDW: 14.3 % (ref 11.5–15.5)
WBC: 4.4 10*3/uL (ref 4.0–10.5)

## 2017-12-22 LAB — COMPREHENSIVE METABOLIC PANEL
ALT: 86 U/L — ABNORMAL HIGH (ref 14–54)
AST: 191 U/L — AB (ref 15–41)
Albumin: 2.8 g/dL — ABNORMAL LOW (ref 3.5–5.0)
Alkaline Phosphatase: 127 U/L — ABNORMAL HIGH (ref 38–126)
Anion gap: 11 (ref 5–15)
BUN: 13 mg/dL (ref 6–20)
CHLORIDE: 96 mmol/L — AB (ref 101–111)
CO2: 26 mmol/L (ref 22–32)
Calcium: 7.4 mg/dL — ABNORMAL LOW (ref 8.9–10.3)
Creatinine, Ser: 0.68 mg/dL (ref 0.44–1.00)
Glucose, Bld: 88 mg/dL (ref 65–99)
POTASSIUM: 3.2 mmol/L — AB (ref 3.5–5.1)
Sodium: 133 mmol/L — ABNORMAL LOW (ref 135–145)
Total Bilirubin: 0.8 mg/dL (ref 0.3–1.2)
Total Protein: 5.6 g/dL — ABNORMAL LOW (ref 6.5–8.1)

## 2017-12-22 LAB — URINE CULTURE

## 2017-12-22 LAB — ROCKY MTN SPOTTED FVR ABS PNL(IGG+IGM)
RMSF IgG: NEGATIVE
RMSF IgM: 0.28 index (ref 0.00–0.89)

## 2017-12-22 MED ORDER — FUROSEMIDE 10 MG/ML IJ SOLN
60.0000 mg | Freq: Once | INTRAMUSCULAR | Status: AC
  Administered 2017-12-22: 60 mg via INTRAVENOUS
  Filled 2017-12-22: qty 6

## 2017-12-22 MED ORDER — POTASSIUM CHLORIDE CRYS ER 20 MEQ PO TBCR
40.0000 meq | EXTENDED_RELEASE_TABLET | Freq: Two times a day (BID) | ORAL | Status: AC
  Administered 2017-12-22 (×2): 40 meq via ORAL
  Filled 2017-12-22 (×2): qty 2

## 2017-12-22 MED ORDER — KETOCONAZOLE 2 % EX CREA
1.0000 "application " | TOPICAL_CREAM | Freq: Two times a day (BID) | CUTANEOUS | Status: DC
  Administered 2017-12-22 – 2017-12-24 (×5): 1 via TOPICAL
  Filled 2017-12-22: qty 15

## 2017-12-22 NOTE — Progress Notes (Signed)
PROGRESS NOTE    Sabrina Adams  BMW:[id number]  DOB: 04/20/1946  DOA: 12/20/2017 PCP: Kathyrn Drown, MD   Brief Admission Hx: Sabrina Adams is a 72 y.o. female with medical history significant for rheumatoid arthritis, hypertension, hypothyroidism, chronic back pain, and chronic constipation, now presenting to the emergency department for evaluation of fevers, headache, nausea, and malaise.   MDM/Assessment & Plan:   1. Malaise - Pt presented with symptoms of low grade fever, malaise, nausea in setting of recent tick exposure.  She does not recall a rash.  She remains fatigued.  She has a PT evaluation pending.  She is being empirically treated with doxycycline for possible RMSF or lyme disease.  Lab tests are pending for lyme and RMSF serologies.  2. FUO - obtain CT chest / abd / pelvis as part of work up for FUO.  Pt is immunocompromised.  3. Sepsis - lactate trending down with hydration.  Continue broadened antibiotic coverage pending cultures.  4. Leukopenia/thrombocytopenia - Holding plaquenil for now.  Follow CBC.   5. Elevated Transaminases - pt was noted to have hepatic steatosis on imaging.  Holding plaquenil.  Follow.  6. RA - reported as stable.  Holding plaquenil temporarily.  Continue home prednisone therapy.  7. Hypothyroidism - Continue synthroid daily. 8. Chronic constipation - no BM reported, continue scheduled laxatives.  9. Pulmonary Edema - saline locked IV, IV lasix ordered.   10. Chronic back pain - stable, MRI done but no acute findings, multiple chronic findings.  Get PT eval.  11. Hyponatremia - treated with IVF hydration.  Recheck in AM.  12. Essential Hypertension - stable. Continue home amlodipine.  13. Yeast Intertrigo - fluconazole given x 1 dose and topical ketoconazole creme 2% ordered for skin folds.   DVT prophylaxis: SCDs Code Status: Full  Family Communication: Husband Disposition Plan: inpatient treatment    Subjective: Pt reports no  bowel movement yet, says she feels weak and her back hurts a lot.   Objective: Vitals:   12/21/17 0634 12/21/17 1051 12/21/17 1900 12/22/17 0514  BP:  (!) 126/41 (!) 132/50 (!) 115/53  Pulse: (!) 102 90 (!) 103 89  Resp: 20 (!) 22  16  Temp: (!) 101.5 F (38.6 C) 98.7 F (37.1 C)  98.5 F (36.9 C)  TempSrc: Oral Oral  Oral  SpO2: 91% 94% 91% 97%  Weight:      Height:        Intake/Output Summary (Last 24 hours) at 12/22/2017 2725 Last data filed at 12/22/2017 0216 Gross per 24 hour  Intake 1070 ml  Output 1050 ml  Net 20 ml   Filed Weights   12/20/17 1304  Weight: 79.4 kg (175 lb)     REVIEW OF SYSTEMS  As per history otherwise all reviewed and reported negative  Exam:  General exam: awake, alert, NAD. Cooperative.  Respiratory system: Clear. No increased work of breathing. Cardiovascular system: S1 & S2 heard, RRR. No JVD, murmurs, gallops, clicks or pedal edema. Gastrointestinal system: Abdomen is nondistended, soft and nontender. Normal bowel sounds heard. Central nervous system: Alert and oriented. No focal neurological deficits. Extremities: no Clubbing or cyanosis. Skin: intertrigo seen in skin folds.   Data Reviewed: Basic Metabolic Panel: Recent Labs  Lab 12/20/17 1335 12/21/17 0507  NA 132* 130*  K 3.8 3.8  CL 93* 96*  CO2 27 23  GLUCOSE 99 72  BUN 20 16  CREATININE 0.88 0.71  CALCIUM 8.5* 7.4*   Liver  Function Tests: Recent Labs  Lab 12/20/17 1335 12/21/17 0507  AST 214* 205*  ALT 135* 103*  ALKPHOS 181* 147*  BILITOT 0.7 1.0  PROT 6.4* 5.4*  ALBUMIN 3.4* 2.8*   Recent Labs  Lab 12/20/17 1335  LIPASE 32   No results for input(s): AMMONIA in the last 168 hours. CBC: Recent Labs  Lab 12/20/17 1335 12/21/17 0507  WBC 2.6* 3.6*  NEUTROABS  --  2.9  HGB 14.1 12.9  HCT 43.3 39.3  MCV 93.1 93.3  PLT 69* 63*   Cardiac Enzymes: No results for input(s): CKTOTAL, CKMB, CKMBINDEX, TROPONINI in the last 168 hours. CBG (last 3)    No results for input(s): GLUCAP in the last 72 hours. Recent Results (from the past 240 hour(s))  Blood Culture (routine x 2)     Status: None (Preliminary result)   Collection Time: 12/20/17  5:14 PM  Result Value Ref Range Status   Specimen Description BLOOD LEFT WRIST  Final   Special Requests   Final    BOTTLES DRAWN AEROBIC AND ANAEROBIC Blood Culture adequate volume   Culture   Final    NO GROWTH 2 DAYS Performed at [facility], [address] [place], [place] [address]    Report Status PENDING  Incomplete  Blood Culture (routine x 2)     Status: None (Preliminary result)   Collection Time: 12/20/17  5:15 PM  Result Value Ref Range Status   Specimen Description LEFT ANTECUBITAL  Final   Special Requests   Final    BOTTLES DRAWN AEROBIC AND ANAEROBIC Blood Culture adequate volume   Culture   Final    NO GROWTH 2 DAYS Performed at [facility], [address], [place], [place] [address]    Report Status PENDING  Incomplete  MRSA PCR Screening     Status: Abnormal   Collection Time: 12/20/17 11:33 PM  Result Value Ref Range Status   MRSA by PCR POSITIVE (A) NEGATIVE Final    Comment:        The GeneXpert MRSA Assay (FDA approved for NASAL specimens only), is one component of a comprehensive MRSA colonization surveillance program. It is not intended to diagnose MRSA infection nor to guide or monitor treatment for MRSA infections. RESULT CALLED TO, READ BACK BY AND VERIFIED WITH: HOBBS A. AT [id number] ON [id number] BY THOMPSON S. Performed at [facility], [address], [place], [place] [address]      Studies: Ct Chest W Contrast  Result Date: 12/21/2017 CLINICAL DATA:  *Malaise - Pt presented with symptoms of low grade fever, malaise, nausea in setting of recent tick exposure. She does not recall a rash. She is being empirically treated with doxycycline for possible RMSF. Lab tests are pending. *FUO - obtain CT chest / abd / pelvis as part of work up for FUO. Pt is  immunocompromised. *Sepsis - lactate trending down with hydration. Broaden antibiotic coverage pending culture and work up. *Leukopenia/thrombocytopenia - Holding plaquenil for now. Follow CBC. *Elevated Transaminases EXAM: CT CHEST, ABDOMEN, AND PELVIS WITH CONTRAST TECHNIQUE: Multidetector CT imaging of the chest, abdomen and pelvis was performed following the standard protocol during bolus administration of intravenous contrast. CONTRAST:  138mL ISOVUE-300 IOPAMIDOL (ISOVUE-300) INJECTION 61% COMPARISON:  Chest radiograph, 12/20/2017. FINDINGS: CT CHEST FINDINGS Cardiovascular: Heart is normal in size. No pericardial effusion. No coronary artery calcifications. Great vessels normal caliber. Mild aortic atherosclerosis. Mediastinum/Nodes: Moderate size hiatal hernia. No mediastinal or hilar masses. No discrete pathologically enlarged lymph nodes. Trachea  is widely patent. No esophageal distension. No neck base or axillary masses or adenopathy. Lungs and pleura: Trace pleural effusions. There is bilateral interstitial thickening that is greater on the right. There are multiple right-sided pulmonary nodules. Largest is in the right upper lobe, centered on image 60, with ill-defined margins, measuring 16 x 8 mm transversely, mean 11 mm. All remaining nodules are subcentimeter. There is additional opacity lung bases, right greater than left, consistent with atelectasis. No pneumothorax. Musculoskeletal: No fracture or acute finding. No osteoblastic or osteolytic lesions. CT ABDOMEN PELVIS FINDINGS Hepatobiliary: Liver is normal in size. There is decreased attenuation of the liver consistent with fatty infiltration. No liver mass or focal lesion. Small dependent gallstone. Gallbladder is otherwise unremarkable. Common bile duct is prominent measuring 8 mm, demonstrating distal tapering. Pancreas: Unremarkable. No pancreatic ductal dilatation or surrounding inflammatory changes. Spleen: Normal in size without focal  abnormality. Adrenals/Urinary Tract: No adrenal masses. Bilateral renal cortical thinning. There are small bilateral low-density renal masses consistent with cysts. No stones. No hydronephrosis. Ureters are normal in course and in caliber. Bladder is unremarkable. Stomach/Bowel: Stomach unremarkable other than the hiatal hernia. Small bowel is normal in caliber. No wall thickening or inflammation. Bowel anastomosis staple line lies along the lower sigmoid colon just above the rectum. No colonic wall thickening or inflammation. No evidence of obstruction. Appendix not discretely seen. No findings of appendicitis. Vascular/Lymphatic: Aortic atherosclerosis. No enlarged abdominal or pelvic lymph nodes. Reproductive: Status post hysterectomy. No adnexal masses. Other: No abdominal wall hernia or abnormality. No abdominopelvic ascites. Musculoskeletal: No fracture or acute finding. Levoscoliosis of the lumbar spine. Degenerative changes noted of the lumbar spine. No osteoblastic or osteolytic lesions. IMPRESSION: 1. Lungs demonstrate multiple small nodules on the right, largest measuring a mean of 11 mm. Suspect that these are infectious/inflammatory in etiology given the history. Non-contrast chest CT at 3-6 months is recommended. If the nodules are stable at time of repeat CT, then future CT at 18-24 months (from today's scan) is considered optional for low-risk patients, but is recommended for high-risk patients. This recommendation follows the consensus statement: Guidelines for Management of Incidental Pulmonary Nodules Detected on CT Images: From the Fleischner Society 2017; Radiology 2017; 284:228-243. 2. Lungs show a right greater than left interstitial thickening with minimal pleural effusions. Findings are consistent with mild asymmetric interstitial pulmonary edema. 3. Dependent opacity at the lung bases, right greater than left, most consistent with atelectasis. No convincing pneumonia. 4. No acute findings  within the abdomen or pelvis. 5. Hepatic steatosis.  Status post hysterectomy. 6. Aortic atherosclerosis. Electronically Signed   By: Lajean Manes M.D.   On: 12/21/2017 13:17   Mr Thoracic Spine W Wo Contrast  Result Date: 12/20/2017 CLINICAL DATA:  Fever, constipation malaise. History of lumbar spine surgery. EXAM: MRI THORACIC AND LUMBAR SPINE WITHOUT AND WITH CONTRAST TECHNIQUE: Multiplanar and multiecho pulse sequences of the thoracic and lumbar spine were obtained without and with intravenous contrast. CONTRAST:  67mL MULTIHANCE GADOBENATE DIMEGLUMINE 529 MG/ML IV SOLN COMPARISON:  Lumbar spine radiographs January 10, 2017. CT abdomen and pelvis October 13, 2010 and MRI lumbar spine Jan 03, 2005. FINDINGS: MRI THORACIC SPINE FINDINGS-moderately motion degraded examination. ALIGNMENT: Maintenance of the thoracic kyphosis. C7-T1 anterolisthesis. Mild broad dextroscoliosis. VERTEBRAE/DISCS: Vertebral bodies are intact. Moderate to severe C5-6 through T7-8 degenerative discs with mild chronic discogenic endplate changes. No abnormal or acute bone marrow signal. Scattered old Schmorl's nodes. No abnormal osseous or disc enhancement. CORD: Thoracic spinal cord is normal  morphology and signal characteristics. No abnormal cord, leptomeningeal or epidural enhancement. PREVERTEBRAL AND PARASPINAL SOFT TISSUES: Nonacute. Large hiatal hernia. DISC LEVELS: Moderate T11-12 RIGHT central disc protrusion versus extrusion without significant migration, enhancing annular fissure. Mild T11-12 canal stenosis with encroachment upon the traversing RIGHT T12 nerve. No neural foraminal narrowing at any level. MRI LUMBAR SPINE FINDINGS-moderately motion degraded examination. SEGMENTATION: For the purposes of this report, the last well-formed intervertebral disc is reported as L5-S1. ALIGNMENT: Maintained lumbar lordosis. Grade 1 L4-5 anterolisthesis and grade 1 L5-S1 retrolisthesis. Lower lumbar scoliosis better demonstrated on prior  radiographs. VERTEBRAE: Vertebral bodies are intact. Severe progressed disc height loss L2-3 through L5-S1 with disc desiccation and moderate to severe chronic discogenic endplate changes. No acute or abnormal bone marrow signal. No abnormal osseous or disc enhancement. CONUS MEDULLARIS AND CAUDA EQUINA: Conus medullaris terminates at T12-L1 and demonstrates normal morphology and signal characteristics. Limited assessment of cauda equina due to patient motion. No abnormal cord, leptomeningeal or epidural enhancement. PARASPINAL AND OTHER SOFT TISSUES: Moderate to severe paraspinal muscle atrophy at and below the level surgical interventions. DISC LEVELS: T12-L1: No disc bulge, canal stenosis nor neural foraminal narrowing. L1-2: Old laminectomies. Mild facet arthropathy without canal stenosis or neural foraminal narrowing. L2-3: Old laminectomies. Small broad-based disc osteophyte complex. No canal stenosis. Mild LEFT neural foraminal narrowing. L3-4: Old laminectomies. Moderate broad-based disc osteophyte complex. Moderate canal stenosis. Mild RIGHT, severe LEFT neural foraminal narrowing. L4-5: Old laminectomies. Anterolisthesis. Small broad-based disc bulge. Severe facet arthropathy. Mild-to-moderate canal stenosis. Mild neural foraminal narrowing. L5-S1: Retrolisthesis. Small broad-based disc osteophyte complex, moderate facet arthropathy without canal stenosis. Mild RIGHT neural foraminal narrowing. IMPRESSION: MRI thoracic spine: 1. Moderately motion degraded examination. 2. No fracture, malalignment or acute osseous process. No suspicious enhancement. 3. Moderate T11-12 disc protrusion versus extrusion resulting in mild canal stenosis. MRI lumbar spine: 1. Moderately motion degraded examination. 2. No fracture or acute osseous process. No suspicious enhancement. 3. Grade 1 L4-5 anterolisthesis and grade 1 L5-S1 retrolisthesis. Old L1-2 through L4-5 laminectomies. 4. Moderate canal stenosis L3-4, mild to  moderate L4-5. 5. Neural foraminal narrowing L2-3 through L5-S1: Severe on the LEFT at L3-4. Electronically Signed   By: Elon Alas M.D.   On: 12/20/2017 18:45   Mr Lumbar Spine W Wo Contrast  Result Date: 12/20/2017 CLINICAL DATA:  Fever, constipation malaise. History of lumbar spine surgery. EXAM: MRI THORACIC AND LUMBAR SPINE WITHOUT AND WITH CONTRAST TECHNIQUE: Multiplanar and multiecho pulse sequences of the thoracic and lumbar spine were obtained without and with intravenous contrast. CONTRAST:  46mL MULTIHANCE GADOBENATE DIMEGLUMINE 529 MG/ML IV SOLN COMPARISON:  Lumbar spine radiographs January 10, 2017. CT abdomen and pelvis October 13, 2010 and MRI lumbar spine Jan 03, 2005. FINDINGS: MRI THORACIC SPINE FINDINGS-moderately motion degraded examination. ALIGNMENT: Maintenance of the thoracic kyphosis. C7-T1 anterolisthesis. Mild broad dextroscoliosis. VERTEBRAE/DISCS: Vertebral bodies are intact. Moderate to severe C5-6 through T7-8 degenerative discs with mild chronic discogenic endplate changes. No abnormal or acute bone marrow signal. Scattered old Schmorl's nodes. No abnormal osseous or disc enhancement. CORD: Thoracic spinal cord is normal morphology and signal characteristics. No abnormal cord, leptomeningeal or epidural enhancement. PREVERTEBRAL AND PARASPINAL SOFT TISSUES: Nonacute. Large hiatal hernia. DISC LEVELS: Moderate T11-12 RIGHT central disc protrusion versus extrusion without significant migration, enhancing annular fissure. Mild T11-12 canal stenosis with encroachment upon the traversing RIGHT T12 nerve. No neural foraminal narrowing at any level. MRI LUMBAR SPINE FINDINGS-moderately motion degraded examination. SEGMENTATION: For the purposes of this report, the  last well-formed intervertebral disc is reported as L5-S1. ALIGNMENT: Maintained lumbar lordosis. Grade 1 L4-5 anterolisthesis and grade 1 L5-S1 retrolisthesis. Lower lumbar scoliosis better demonstrated on prior radiographs.  VERTEBRAE: Vertebral bodies are intact. Severe progressed disc height loss L2-3 through L5-S1 with disc desiccation and moderate to severe chronic discogenic endplate changes. No acute or abnormal bone marrow signal. No abnormal osseous or disc enhancement. CONUS MEDULLARIS AND CAUDA EQUINA: Conus medullaris terminates at T12-L1 and demonstrates normal morphology and signal characteristics. Limited assessment of cauda equina due to patient motion. No abnormal cord, leptomeningeal or epidural enhancement. PARASPINAL AND OTHER SOFT TISSUES: Moderate to severe paraspinal muscle atrophy at and below the level surgical interventions. DISC LEVELS: T12-L1: No disc bulge, canal stenosis nor neural foraminal narrowing. L1-2: Old laminectomies. Mild facet arthropathy without canal stenosis or neural foraminal narrowing. L2-3: Old laminectomies. Small broad-based disc osteophyte complex. No canal stenosis. Mild LEFT neural foraminal narrowing. L3-4: Old laminectomies. Moderate broad-based disc osteophyte complex. Moderate canal stenosis. Mild RIGHT, severe LEFT neural foraminal narrowing. L4-5: Old laminectomies. Anterolisthesis. Small broad-based disc bulge. Severe facet arthropathy. Mild-to-moderate canal stenosis. Mild neural foraminal narrowing. L5-S1: Retrolisthesis. Small broad-based disc osteophyte complex, moderate facet arthropathy without canal stenosis. Mild RIGHT neural foraminal narrowing. IMPRESSION: MRI thoracic spine: 1. Moderately motion degraded examination. 2. No fracture, malalignment or acute osseous process. No suspicious enhancement. 3. Moderate T11-12 disc protrusion versus extrusion resulting in mild canal stenosis. MRI lumbar spine: 1. Moderately motion degraded examination. 2. No fracture or acute osseous process. No suspicious enhancement. 3. Grade 1 L4-5 anterolisthesis and grade 1 L5-S1 retrolisthesis. Old L1-2 through L4-5 laminectomies. 4. Moderate canal stenosis L3-4, mild to moderate L4-5. 5.  Neural foraminal narrowing L2-3 through L5-S1: Severe on the LEFT at L3-4. Electronically Signed   By: Elon Alas M.D.   On: 12/20/2017 18:45   US Abdomen Complete  Result Date: 12/21/2017 CLINICAL DATA:  Elevated transaminase level. EXAM: ABDOMEN ULTRASOUND COMPLETE COMPARISON:  CT scan of same day. FINDINGS: Gallbladder: 7 mm gallstone is noted. No gallbladder wall thickening or pericholecystic fluid is noted. No sonographic Murphy's sign is noted. Common bile duct: Diameter: 2 mm which is within normal limits. Liver: 1.3 cm cyst is noted centrally. Within normal limits in parenchymal echogenicity. Portal vein is patent on color Doppler imaging with normal direction of blood flow towards the liver. IVC: No abnormality visualized. Pancreas: Visualized portion unremarkable. Spleen: Size and appearance within normal limits. Right Kidney: Length: 11.1 cm. Increased echogenicity of renal parenchyma is noted. No mass or hydronephrosis visualized. Left Kidney: Length: 12.1 cm. Increased echogenicity of renal parenchyma is noted. No mass or hydronephrosis visualized. Abdominal aorta: No aneurysm visualized. Other findings: None. IMPRESSION: Small solitary gallstone.  No evidence of cholecystitis. Increased echogenicity of renal parenchyma is noted bilaterally suggesting medical renal disease. Electronically Signed   By: Marijo Conception, M.D.   On: 12/21/2017 14:32   Ct Abdomen Pelvis W Contrast  Result Date: 12/21/2017 CLINICAL DATA:  *Malaise - Pt presented with symptoms of low grade fever, malaise, nausea in setting of recent tick exposure. She does not recall a rash. She is being empirically treated with doxycycline for possible RMSF. Lab tests are pending. *FUO - obtain CT chest / abd / pelvis as part of work up for FUO. Pt is immunocompromised. *Sepsis - lactate trending down with hydration. Broaden antibiotic coverage pending culture and work up. *Leukopenia/thrombocytopenia - Holding plaquenil for  now. Follow CBC. *Elevated Transaminases EXAM: CT CHEST, ABDOMEN, AND PELVIS  WITH CONTRAST TECHNIQUE: Multidetector CT imaging of the chest, abdomen and pelvis was performed following the standard protocol during bolus administration of intravenous contrast. CONTRAST:  120mL ISOVUE-300 IOPAMIDOL (ISOVUE-300) INJECTION 61% COMPARISON:  Chest radiograph, 12/20/2017. FINDINGS: CT CHEST FINDINGS Cardiovascular: Heart is normal in size. No pericardial effusion. No coronary artery calcifications. Great vessels normal caliber. Mild aortic atherosclerosis. Mediastinum/Nodes: Moderate size hiatal hernia. No mediastinal or hilar masses. No discrete pathologically enlarged lymph nodes. Trachea is widely patent. No esophageal distension. No neck base or axillary masses or adenopathy. Lungs and pleura: Trace pleural effusions. There is bilateral interstitial thickening that is greater on the right. There are multiple right-sided pulmonary nodules. Largest is in the right upper lobe, centered on image 60, with ill-defined margins, measuring 16 x 8 mm transversely, mean 11 mm. All remaining nodules are subcentimeter. There is additional opacity lung bases, right greater than left, consistent with atelectasis. No pneumothorax. Musculoskeletal: No fracture or acute finding. No osteoblastic or osteolytic lesions. CT ABDOMEN PELVIS FINDINGS Hepatobiliary: Liver is normal in size. There is decreased attenuation of the liver consistent with fatty infiltration. No liver mass or focal lesion. Small dependent gallstone. Gallbladder is otherwise unremarkable. Common bile duct is prominent measuring 8 mm, demonstrating distal tapering. Pancreas: Unremarkable. No pancreatic ductal dilatation or surrounding inflammatory changes. Spleen: Normal in size without focal abnormality. Adrenals/Urinary Tract: No adrenal masses. Bilateral renal cortical thinning. There are small bilateral low-density renal masses consistent with cysts. No stones. No  hydronephrosis. Ureters are normal in course and in caliber. Bladder is unremarkable. Stomach/Bowel: Stomach unremarkable other than the hiatal hernia. Small bowel is normal in caliber. No wall thickening or inflammation. Bowel anastomosis staple line lies along the lower sigmoid colon just above the rectum. No colonic wall thickening or inflammation. No evidence of obstruction. Appendix not discretely seen. No findings of appendicitis. Vascular/Lymphatic: Aortic atherosclerosis. No enlarged abdominal or pelvic lymph nodes. Reproductive: Status post hysterectomy. No adnexal masses. Other: No abdominal wall hernia or abnormality. No abdominopelvic ascites. Musculoskeletal: No fracture or acute finding. Levoscoliosis of the lumbar spine. Degenerative changes noted of the lumbar spine. No osteoblastic or osteolytic lesions. IMPRESSION: 1. Lungs demonstrate multiple small nodules on the right, largest measuring a mean of 11 mm. Suspect that these are infectious/inflammatory in etiology given the history. Non-contrast chest CT at 3-6 months is recommended. If the nodules are stable at time of repeat CT, then future CT at 18-24 months (from today's scan) is considered optional for low-risk patients, but is recommended for high-risk patients. This recommendation follows the consensus statement: Guidelines for Management of Incidental Pulmonary Nodules Detected on CT Images: From the Fleischner Society 2017; Radiology 2017; 284:228-243. 2. Lungs show a right greater than left interstitial thickening with minimal pleural effusions. Findings are consistent with mild asymmetric interstitial pulmonary edema. 3. Dependent opacity at the lung bases, right greater than left, most consistent with atelectasis. No convincing pneumonia. 4. No acute findings within the abdomen or pelvis. 5. Hepatic steatosis.  Status post hysterectomy. 6. Aortic atherosclerosis. Electronically Signed   By: Lajean Manes M.D.   On: 12/21/2017 13:17    Dg Chest Port 1 View  Result Date: 12/22/2017 CLINICAL DATA:  Sepsis. EXAM: PORTABLE CHEST 1 VIEW COMPARISON:  CT chest from yesterday. Chest x-ray dated Dec 20, 2017. FINDINGS: The heart size and mediastinal contours are within normal limits. Diffusely increased interstitial markings, similar to prior CT. Small right pleural effusion, unchanged. Known small nodules in the right lung are not well visualized  by x-ray. No pneumothorax. No acute osseous abnormality. IMPRESSION: 1. Stable interstitial edema and small right pleural effusion. 2. Known small nodules in the right lung are not well visualized by x-ray. Electronically Signed   By: Titus Dubin M.D.   On: 12/22/2017 08:59   Dg Chest Port 1 View  Result Date: 12/20/2017 CLINICAL DATA:  Patient states abdominal pain, nausea, vomiting, and constipation since Sunday. Fever at home reached 103.9. Hx of HTN. EXAM: PORTABLE CHEST 1 VIEW COMPARISON:  10/25/2005 FINDINGS: Heart is enlarged. There are prominent interstitial markings consistent with mild pulmonary edema. Suspect hiatal hernia. IMPRESSION: 1. Cardiomegaly and mild interstitial edema. 2. Hiatal hernia. Electronically Signed   By: Nolon Nations M.D.   On: 12/20/2017 18:42     Scheduled Meds: . amLODipine  10 mg Oral Daily  . B-complex with vitamin C  1 tablet Oral Daily  . Chlorhexidine Gluconate Cloth  6 each Topical Q0600  . docusate sodium  200 mg Oral QHS  . furosemide  60 mg Intravenous Once  . gabapentin  300 mg Oral BID  . ketoconazole   Topical BID  . levothyroxine  112 mcg Oral QAC breakfast  . mouth rinse  15 mL Mouth Rinse BID  . multivitamin with minerals  1 tablet Oral Daily  . mupirocin ointment  1 application Nasal BID  . pantoprazole  40 mg Oral Daily  . polyethylene glycol  17 g Oral BID  . polyvinyl alcohol  1 drop Both Eyes BID  . potassium chloride  40 mEq Oral BID  . predniSONE  7.5 mg Oral Q breakfast  . psyllium  1 packet Oral BID  . timolol  1  drop Left Eye Daily   Continuous Infusions: . doxycycline (VIBRAMYCIN) IV 100 mg (12/22/17 0812)  . piperacillin-tazobactam (ZOSYN)  IV Stopped (12/22/17 0645)  . vancomycin Stopped (12/22/17 0032)    Principal Problem:   [facility] spotted fever Active Problems:   Rheumatoid arthritis ([place])   Essential hypertension, benign   Chronic pain syndrome   Hypothyroidism   LFTs abnormal   Leukopenia   Thrombocytopenia (HCC)   Hyponatremia   Time spent:   Irwin Brakeman, MD, FAAFP [facility] Pager [phone]  If 7PM-7AM, please contact night-coverage [email or web address] Password [email or web address] 12/22/2017, 9:04 AM    LOS: 2 days

## 2017-12-22 NOTE — Evaluation (Signed)
Physical Therapy Evaluation Patient Details Name: Sabrina Adams MRN: [id number] DOB: Oct 02, 1945 Today's Date: 12/22/2017   History of Present Illness  Sabrina Adams is a 72 y.o. female with medical history significant for rheumatoid arthritis, hypertension, hypothyroidism, chronic back pain, and chronic constipation, now presenting to the emergency department for evaluation of fevers, headache, nausea, and malaise.  Patient removed a tick from the medial aspect of her right upper extremity approximately 3 weeks ago, has been dealing with acute on chronic constipation since that time, and then developed headache with subjective fevers, nausea, and general malaise a few days ago.  Her condition worsened today with increased fever, severe nausea without much vomiting, and persistent headache.  She denies change in vision or hearing, and denies focal numbness, weakness, or confusion.  She denies cough, shortness of breath, or dysuria.  Denies rash.    Clinical Impression  Patient limited for functional mobility and gait as stated below secondary to BLE weakness, fatigue and poor standing balance.  Patient will benefit from continued physical therapy in hospital and recommended venue below to increase strength, balance, endurance for safe ADLs and gait.     Follow Up Recommendations SNF;Supervision/Assistance - 24 hour    Equipment Recommendations  Rolling walker with 5" wheels    Recommendations for Other Services       Precautions / Restrictions Precautions Precautions: Fall Precaution Comments: Patient normally wears shoe on left foot and not on right for household gait due to old left ankle surgery resulting in leg length discrepancey Restrictions Weight Bearing Restrictions: No      Mobility  Bed Mobility Overal bed mobility: Needs Assistance Bed Mobility: Supine to Sit     Supine to sit: Supervision     General bed mobility comments: with head of bed raised and use of  siderail  Transfers Overall transfer level: Needs assistance Equipment used: Straight cane;Rolling walker (2 wheeled) Transfers: Sit to/from [facility] Sit to Stand: Min assist Stand pivot transfers: Min assist       General transfer comment: had difficulty supporting her self using cane, had to use RW for safety  Ambulation/Gait Ambulation/Gait assistance: Min assist Ambulation Distance (Feet): 20 Feet Assistive device: Rolling walker (2 wheeled) Gait Pattern/deviations: Decreased step length - right;Decreased step length - left;Decreased stance time - left;Decreased stride length Gait velocity: slow   General Gait Details: unable to use cane for walking due to poor standing balance, required use of RW, demonstrates slow labored unsteady cadence had to lean against doorway to rest before walking back to bedside  Stairs            Wheelchair Mobility    Modified Rankin (Stroke Patients Only)       Balance Overall balance assessment: Needs assistance Sitting-balance support: Feet supported;No upper extremity supported Sitting balance-Leahy Scale: Good     Standing balance support: Single extremity supported;During functional activity Standing balance-Leahy Scale: Poor Standing balance comment: poor using cane, fair using RW                             Pertinent Vitals/Pain Pain Assessment: 0-10 Pain Score: 7  Pain Location: chronic low back Pain Descriptors / Indicators: Aching;Discomfort Pain Intervention(s): Limited activity within patient's tolerance;Monitored during session    Home Living Family/patient expects to be discharged to:: Private residence Living Arrangements: Spouse/significant other Available Help at Discharge: Family Type of Home: House Home Access: Stairs to enter Entrance Stairs-Rails:  Left Entrance Stairs-Number of Steps: 3 Home Layout: One level Home Equipment: Cane - single point;Wheelchair -  [employer];Shower seat Additional Comments: (patient has cane and stabilizing shoe for left ankle in her room)    Prior Function Level of Independence: Independent with assistive device(s)   Gait / Transfers Assistance Needed: household ambulation with single point cane (patient has cane and stabilizing shoe for left ankle in her room)  ADL's / Homemaking Assistance Needed: spouse assist        Hand Dominance        Extremity/Trunk Assessment   Upper Extremity Assessment Upper Extremity Assessment: Generalized weakness    Lower Extremity Assessment Lower Extremity Assessment: Generalized weakness    Cervical / Trunk Assessment Cervical / Trunk Assessment: Normal  Communication   Communication: No difficulties  Cognition Arousal/Alertness: Awake/alert Behavior During Therapy: WFL for tasks assessed/performed Overall Cognitive Status: Within Functional Limits for tasks assessed                                        General Comments      Exercises     Assessment/Plan    PT Assessment Patient needs continued PT services  PT Problem List Decreased strength;Decreased activity tolerance;Decreased balance;Decreased mobility       PT Treatment Interventions Gait training;Stair training;Functional mobility training;Therapeutic activities;Therapeutic exercise;Patient/family education    PT Goals (Current goals can be found in the Care Plan section)  Acute Rehab PT Goals Patient Stated Goal: return home PT Goal Formulation: With patient Time For Goal Achievement: 01/05/18 Potential to Achieve Goals: Good    Frequency Min 3X/week   Barriers to discharge        Co-evaluation               AM-PAC PT "6 Clicks" Daily Activity  Outcome Measure Difficulty turning over in bed (including adjusting bedclothes, sheets and blankets)?: None Difficulty moving from lying on back to sitting on the side of the bed? : A Little Difficulty  sitting down on and standing up from a chair with arms (e.g., wheelchair, bedside commode, etc,.)?: A Little Help needed moving to and from a bed to chair (including a wheelchair)?: A Little Help needed walking in hospital room?: A Lot Help needed climbing 3-5 steps with a railing? : A Lot 6 Click Score: 17    End of Session   Activity Tolerance: Patient limited by fatigue;Patient tolerated treatment well Patient left: in chair;with call bell/phone within reach Nurse Communication: Mobility status;Other (comment)(nursing staff notified patient left in chair) PT Visit Diagnosis: Unsteadiness on feet (R26.81);Other abnormalities of gait and mobility (R26.89);Muscle weakness (generalized) (M62.81)    Time: 0263-7858 PT Time Calculation (min) (ACUTE ONLY): 30 min   Charges:   PT Evaluation $PT Eval Moderate Complexity: 1 Mod PT Treatments $Therapeutic Activity: 23-37 mins   PT G Codes:        11:05 AM, 12-24-2017 Lonell Grandchild, MPT Physical Therapist with [facility] [phone] office [phone] mobile phone

## 2017-12-22 NOTE — Plan of Care (Signed)
  Problem: Acute Rehab PT Goals(only PT should resolve) Goal: Pt Will Go Supine/Side To Sit Outcome: Progressing Flowsheets (Taken 12/22/2017 1108) Pt will go Supine/Side to Sit: with modified independence Goal: Patient Will Transfer Sit To/From Stand Outcome: Progressing Flowsheets (Taken 12/22/2017 1108) Patient will transfer sit to/from stand: with min guard assist Goal: Pt Will Transfer Bed To Chair/Chair To Bed Outcome: Progressing Flowsheets (Taken 12/22/2017 1108) Pt will Transfer Bed to Chair/Chair to Bed: min guard assist Goal: Pt Will Ambulate Outcome: Progressing Flowsheets (Taken 12/22/2017 1108) Pt will Ambulate: 50 feet;with min guard assist;with rolling walker;with cane  11:09 AM, 12/22/17 Lonell Grandchild, MPT Physical Therapist with [facility] [phone] office [phone] mobile phone

## 2017-12-23 LAB — CBC WITH DIFFERENTIAL/PLATELET
Basophils Absolute: 0.1 10*3/uL (ref 0.0–0.1)
Basophils Relative: 3 %
EOS PCT: 0 %
Eosinophils Absolute: 0 10*3/uL (ref 0.0–0.7)
HEMATOCRIT: 37.4 % (ref 36.0–46.0)
Hemoglobin: 12.2 g/dL (ref 12.0–15.0)
LYMPHS ABS: 2 10*3/uL (ref 0.7–4.0)
Lymphocytes Relative: 46 %
MCH: 30.2 pg (ref 26.0–34.0)
MCHC: 32.6 g/dL (ref 30.0–36.0)
MCV: 92.6 fL (ref 78.0–100.0)
MONOS PCT: 13 %
Monocytes Absolute: 0.5 10*3/uL (ref 0.1–1.0)
NEUTROS ABS: 1.6 10*3/uL — AB (ref 1.7–7.7)
Neutrophils Relative %: 38 %
Platelets: 98 10*3/uL — ABNORMAL LOW (ref 150–400)
RBC: 4.04 MIL/uL (ref 3.87–5.11)
RDW: 14.9 % (ref 11.5–15.5)
WBC: 4.2 10*3/uL (ref 4.0–10.5)

## 2017-12-23 LAB — MAGNESIUM: MAGNESIUM: 1.7 mg/dL (ref 1.7–2.4)

## 2017-12-23 LAB — COMPREHENSIVE METABOLIC PANEL
ALT: 71 U/L — ABNORMAL HIGH (ref 14–54)
ANION GAP: 9 (ref 5–15)
AST: 145 U/L — ABNORMAL HIGH (ref 15–41)
Albumin: 2.5 g/dL — ABNORMAL LOW (ref 3.5–5.0)
Alkaline Phosphatase: 116 U/L (ref 38–126)
BILIRUBIN TOTAL: 0.7 mg/dL (ref 0.3–1.2)
BUN: 19 mg/dL (ref 6–20)
CO2: 31 mmol/L (ref 22–32)
Calcium: 7.6 mg/dL — ABNORMAL LOW (ref 8.9–10.3)
Chloride: 100 mmol/L — ABNORMAL LOW (ref 101–111)
Creatinine, Ser: 0.82 mg/dL (ref 0.44–1.00)
Glucose, Bld: 69 mg/dL (ref 65–99)
POTASSIUM: 3.7 mmol/L (ref 3.5–5.1)
Sodium: 140 mmol/L (ref 135–145)
TOTAL PROTEIN: 5.3 g/dL — AB (ref 6.5–8.1)

## 2017-12-23 MED ORDER — DOXYCYCLINE HYCLATE 100 MG PO TABS
100.0000 mg | ORAL_TABLET | Freq: Two times a day (BID) | ORAL | Status: DC
  Administered 2017-12-24: 100 mg via ORAL
  Filled 2017-12-23: qty 1

## 2017-12-23 NOTE — Progress Notes (Signed)
PROGRESS NOTE    Sabrina Adams  FTD:[id number]  DOB: [birth year]  DOA: 12/20/2017 PCP: Sabrina Drown, MD   Brief Admission Hx: Sabrina Adams is a 72 y.o. female with medical history significant for rheumatoid arthritis, hypertension, hypothyroidism, chronic back pain, and chronic constipation, now presenting to the emergency department for evaluation of fevers, headache, nausea, and malaise.   MDM/Assessment & Plan:   1. Malaise - Pt presented with symptoms of low grade fever, malaise, nausea in setting of recent tick exposure.  She does not recall a rash.  She remains fatigued.  She has a PT evaluation pending.  She was empirically treated with doxycycline for possible RMSF or lyme disease.  Lab tests are pending for lyme but RMSF testing has been negative. Keeping on doxycycline for now.   2. FUO - CT chest / abd / pelvis as part of work up for FUO did not show any specific infection.  Fever has responded to broad spectrum antibiotics and will plan to de-escalate antibiotics today.  Pt is immunocompromised.  3. Sepsis - resolved now.  lactate trending down with hydration.  Cultures have been negative to date, de-esacalating antibiotics 5/19.  4. Leukopenia/thrombocytopenia - Holding plaquenil for now.  Follow CBC.  Improving.  5. Elevated Transaminases - slightly improved.  Pt was noted to have hepatic steatosis on imaging.  Holding plaquenil.  Follow.  6. RA - reported as stable but her disease is moderate to severe.  Holding plaquenil temporarily.  Continue home prednisone therapy.  7. Hypothyroidism - Continue synthroid daily. 8. Chronic constipation - 1 BM reported, continue scheduled laxatives.  9. Pulmonary Edema - saline locked IV, IV lasix ordered and patient has diuresed.   10. Chronic back pain - stable, MRI done but no acute findings, multiple chronic findings.  PT eval recommending SNF placement, consulted social worker. Pt is agreeable to SNF rehab placement and  requesting [facility].  11. Hyponatremia - resolved now.   12. Essential Hypertension - stable. Continue home amlodipine.  13. Yeast Intertrigo - fluconazole given x 1 dose and topical ketoconazole creme 2% ordered for skin folds.   DVT prophylaxis: SCDs Code Status: Full  Family Communication: Husband Disposition Plan: inpatient treatment    Subjective: Pt says that she is feeling better today.  She remains weak.  She says that she had a bowel movement yesterday.  Objective: Vitals:   12/22/17 1623 12/22/17 2127 12/22/17 2146 12/23/17 0514  BP: (!) 125/56  124/64 127/72  Pulse: 77  78 72  Resp: 20  20 20   Temp: 97.8 F (36.6 C)  98.4 F (36.9 C) 97.8 F (36.6 C)  TempSrc: Oral  Oral Oral  SpO2: 95% 99% 98% 100%  Weight:    77.5 kg (170 lb 13.7 oz)  Height:        Intake/Output Summary (Last 24 hours) at 12/23/2017 0827 Last data filed at 12/23/2017 0617 Gross per 24 hour  Intake 960 ml  Output 1150 ml  Net -190 ml   Filed Weights   12/20/17 1304 12/23/17 0514  Weight: 79.4 kg (175 lb) 77.5 kg (170 lb 13.7 oz)   REVIEW OF SYSTEMS  As per history otherwise all reviewed and reported negative  Exam:  General exam: awake, alert, NAD. Cooperative.  Respiratory system: Clear. No increased work of breathing. Cardiovascular system: S1 & S2 heard, RRR. No JVD, murmurs, gallops, clicks or pedal edema. Gastrointestinal system: Abdomen is nondistended, soft and nontender. Normal bowel sounds heard. Central  nervous system: Alert and oriented. No focal neurological deficits. Extremities:  Chronic deformity of right ankle and right hand with severe arthritic changes,  No cyanosis. Skin: intertrigo seen in skin folds.   Data Reviewed: Basic Metabolic Panel: Recent Labs  Lab 12/20/17 1335 12/21/17 0507 12/22/17 0817 12/23/17 0603  NA 132* 130* 133* 140  K 3.8 3.8 3.2* 3.7  CL 93* 96* 96* 100*  CO2 27 23 26 31   GLUCOSE 99 72 88 69  BUN 20 16 13 19   CREATININE 0.88  0.71 0.68 0.82  CALCIUM 8.5* 7.4* 7.4* 7.6*  MG  --   --   --  1.7   Liver Function Tests: Recent Labs  Lab 12/20/17 1335 12/21/17 0507 12/22/17 0817 12/23/17 0603  AST 214* 205* 191* 145*  ALT 135* 103* 86* 71*  ALKPHOS 181* 147* 127* 116  BILITOT 0.7 1.0 0.8 0.7  PROT 6.4* 5.4* 5.6* 5.3*  ALBUMIN 3.4* 2.8* 2.8* 2.5*   Recent Labs  Lab 12/20/17 1335  LIPASE 32   No results for input(s): AMMONIA in the last 168 hours. CBC: Recent Labs  Lab 12/20/17 1335 12/21/17 0507 12/22/17 0817 12/23/17 0603  WBC 2.6* 3.6* 4.4 4.2  NEUTROABS  --  2.9 2.9 1.6*  HGB 14.1 12.9 13.1 12.2  HCT 43.3 39.3 39.8 37.4  MCV 93.1 93.3 90.5 92.6  PLT 69* 63* 78* 98*   Cardiac Enzymes: No results for input(s): CKTOTAL, CKMB, CKMBINDEX, TROPONINI in the last 168 hours. CBG (last 3)  No results for input(s): GLUCAP in the last 72 hours. Recent Results (from the past 240 hour(s))  Urine culture     Status: Abnormal   Collection Time: 12/20/17  4:36 PM  Result Value Ref Range Status   Specimen Description   Final    URINE, CLEAN CATCH Performed at [facility], [address] [place], [place] [address]    Special Requests   Final    NONE Performed at [facility], [address], [place], [place] [address]    Culture (A)  Final    <10,000 COLONIES/mL INSIGNIFICANT GROWTH Performed at [facility] [address], [place], [place] [address]    Report Status 12/22/2017 FINAL  Final  Blood Culture (routine x 2)     Status: None (Preliminary result)   Collection Time: 12/20/17  5:14 PM  Result Value Ref Range Status   Specimen Description BLOOD LEFT WRIST  Final   Special Requests   Final    BOTTLES DRAWN AEROBIC AND ANAEROBIC Blood Culture adequate volume   Culture   Final    NO GROWTH 3 DAYS Performed at [facility], [address] [place], [place] [address]    Report Status PENDING  Incomplete  Blood Culture (routine x 2)     Status: None (Preliminary result)   Collection  Time: 12/20/17  5:15 PM  Result Value Ref Range Status   Specimen Description LEFT ANTECUBITAL  Final   Special Requests   Final    BOTTLES DRAWN AEROBIC AND ANAEROBIC Blood Culture adequate volume   Culture   Final    NO GROWTH 3 DAYS Performed at [facility], [address], [place],  [address]    Report Status PENDING  Incomplete  MRSA PCR Screening     Status: Abnormal   Collection Time: 12/20/17 11:33 PM  Result Value Ref Range Status   MRSA by PCR POSITIVE (A) NEGATIVE Final    Comment:  The GeneXpert MRSA Assay (FDA approved for NASAL specimens only), is one component of a comprehensive MRSA colonization surveillance program. It is not intended to diagnose MRSA infection nor to guide or monitor treatment for MRSA infections. RESULT CALLED TO, READ BACK BY AND VERIFIED WITH: HOBBS A. AT [id number] ON [id number] BY THOMPSON S. Performed at [facility], [address], [place], [place] [address]      Studies: Ct Chest W Contrast  Result Date: 12/21/2017 CLINICAL DATA:  *Malaise - Pt presented with symptoms of low grade fever, malaise, nausea in setting of recent tick exposure. She does not recall a rash. She is being empirically treated with doxycycline for possible RMSF. Lab tests are pending. *FUO - obtain CT chest / abd / pelvis as part of work up for FUO. Pt is immunocompromised. *Sepsis - lactate trending down with hydration. Broaden antibiotic coverage pending culture and work up. *Leukopenia/thrombocytopenia - Holding plaquenil for now. Follow CBC. *Elevated Transaminases EXAM: CT CHEST, ABDOMEN, AND PELVIS WITH CONTRAST TECHNIQUE: Multidetector CT imaging of the chest, abdomen and pelvis was performed following the standard protocol during bolus administration of intravenous contrast. CONTRAST:  128mL ISOVUE-300 IOPAMIDOL (ISOVUE-300) INJECTION 61% COMPARISON:  Chest radiograph, 12/20/2017. FINDINGS: CT CHEST FINDINGS Cardiovascular: Heart is normal in size. No  pericardial effusion. No coronary artery calcifications. Great vessels normal caliber. Mild aortic atherosclerosis. Mediastinum/Nodes: Moderate size hiatal hernia. No mediastinal or hilar masses. No discrete pathologically enlarged lymph nodes. Trachea is widely patent. No esophageal distension. No neck base or axillary masses or adenopathy. Lungs and pleura: Trace pleural effusions. There is bilateral interstitial thickening that is greater on the right. There are multiple right-sided pulmonary nodules. Largest is in the right upper lobe, centered on image 60, with ill-defined margins, measuring 16 x 8 mm transversely, mean 11 mm. All remaining nodules are subcentimeter. There is additional opacity lung bases, right greater than left, consistent with atelectasis. No pneumothorax. Musculoskeletal: No fracture or acute finding. No osteoblastic or osteolytic lesions. CT ABDOMEN PELVIS FINDINGS Hepatobiliary: Liver is normal in size. There is decreased attenuation of the liver consistent with fatty infiltration. No liver mass or focal lesion. Small dependent gallstone. Gallbladder is otherwise unremarkable. Common bile duct is prominent measuring 8 mm, demonstrating distal tapering. Pancreas: Unremarkable. No pancreatic ductal dilatation or surrounding inflammatory changes. Spleen: Normal in size without focal abnormality. Adrenals/Urinary Tract: No adrenal masses. Bilateral renal cortical thinning. There are small bilateral low-density renal masses consistent with cysts. No stones. No hydronephrosis. Ureters are normal in course and in caliber. Bladder is unremarkable. Stomach/Bowel: Stomach unremarkable other than the hiatal hernia. Small bowel is normal in caliber. No wall thickening or inflammation. Bowel anastomosis staple line lies along the lower sigmoid colon just above the rectum. No colonic wall thickening or inflammation. No evidence of obstruction. Appendix not discretely seen. No findings of appendicitis.  Vascular/Lymphatic: Aortic atherosclerosis. No enlarged abdominal or pelvic lymph nodes. Reproductive: Status post hysterectomy. No adnexal masses. Other: No abdominal wall hernia or abnormality. No abdominopelvic ascites. Musculoskeletal: No fracture or acute finding. Levoscoliosis of the lumbar spine. Degenerative changes noted of the lumbar spine. No osteoblastic or osteolytic lesions. IMPRESSION: 1. Lungs demonstrate multiple small nodules on the right, largest measuring a mean of 11 mm. Suspect that these are infectious/inflammatory in etiology given the history. Non-contrast chest CT at 3-6 months is recommended. If the nodules are stable at time of repeat CT, then future CT at 18-24 months (from today's scan) is considered optional for low-risk patients, but  is recommended for high-risk patients. This recommendation follows the consensus statement: Guidelines for Management of Incidental Pulmonary Nodules Detected on CT Images: From the Fleischner Society 2017; Radiology 2017; 284:228-243. 2. Lungs show a right greater than left interstitial thickening with minimal pleural effusions. Findings are consistent with mild asymmetric interstitial pulmonary edema. 3. Dependent opacity at the lung bases, right greater than left, most consistent with atelectasis. No convincing pneumonia. 4. No acute findings within the abdomen or pelvis. 5. Hepatic steatosis.  Status post hysterectomy. 6. Aortic atherosclerosis. Electronically Signed   By: Lajean Manes M.D.   On: 12/21/2017 13:17   US Abdomen Complete  Result Date: 12/21/2017 CLINICAL DATA:  Elevated transaminase level. EXAM: ABDOMEN ULTRASOUND COMPLETE COMPARISON:  CT scan of same day. FINDINGS: Gallbladder: 7 mm gallstone is noted. No gallbladder wall thickening or pericholecystic fluid is noted. No sonographic Murphy's sign is noted. Common bile duct: Diameter: 2 mm which is within normal limits. Liver: 1.3 cm cyst is noted centrally. Within normal limits in  parenchymal echogenicity. Portal vein is patent on color Doppler imaging with normal direction of blood flow towards the liver. IVC: No abnormality visualized. Pancreas: Visualized portion unremarkable. Spleen: Size and appearance within normal limits. Right Kidney: Length: 11.1 cm. Increased echogenicity of renal parenchyma is noted. No mass or hydronephrosis visualized. Left Kidney: Length: 12.1 cm. Increased echogenicity of renal parenchyma is noted. No mass or hydronephrosis visualized. Abdominal aorta: No aneurysm visualized. Other findings: None. IMPRESSION: Small solitary gallstone.  No evidence of cholecystitis. Increased echogenicity of renal parenchyma is noted bilaterally suggesting medical renal disease. Electronically Signed   By: Marijo Conception, M.D.   On: 12/21/2017 14:32   Ct Abdomen Pelvis W Contrast  Result Date: 12/21/2017 CLINICAL DATA:  *Malaise - Pt presented with symptoms of low grade fever, malaise, nausea in setting of recent tick exposure. She does not recall a rash. She is being empirically treated with doxycycline for possible RMSF. Lab tests are pending. *FUO - obtain CT chest / abd / pelvis as part of work up for FUO. Pt is immunocompromised. *Sepsis - lactate trending down with hydration. Broaden antibiotic coverage pending culture and work up. *Leukopenia/thrombocytopenia - Holding plaquenil for now. Follow CBC. *Elevated Transaminases EXAM: CT CHEST, ABDOMEN, AND PELVIS WITH CONTRAST TECHNIQUE: Multidetector CT imaging of the chest, abdomen and pelvis was performed following the standard protocol during bolus administration of intravenous contrast. CONTRAST:  126mL ISOVUE-300 IOPAMIDOL (ISOVUE-300) INJECTION 61% COMPARISON:  Chest radiograph, 12/20/2017. FINDINGS: CT CHEST FINDINGS Cardiovascular: Heart is normal in size. No pericardial effusion. No coronary artery calcifications. Great vessels normal caliber. Mild aortic atherosclerosis. Mediastinum/Nodes: Moderate size hiatal  hernia. No mediastinal or hilar masses. No discrete pathologically enlarged lymph nodes. Trachea is widely patent. No esophageal distension. No neck base or axillary masses or adenopathy. Lungs and pleura: Trace pleural effusions. There is bilateral interstitial thickening that is greater on the right. There are multiple right-sided pulmonary nodules. Largest is in the right upper lobe, centered on image 60, with ill-defined margins, measuring 16 x 8 mm transversely, mean 11 mm. All remaining nodules are subcentimeter. There is additional opacity lung bases, right greater than left, consistent with atelectasis. No pneumothorax. Musculoskeletal: No fracture or acute finding. No osteoblastic or osteolytic lesions. CT ABDOMEN PELVIS FINDINGS Hepatobiliary: Liver is normal in size. There is decreased attenuation of the liver consistent with fatty infiltration. No liver mass or focal lesion. Small dependent gallstone. Gallbladder is otherwise unremarkable. Common bile duct is prominent measuring 8  mm, demonstrating distal tapering. Pancreas: Unremarkable. No pancreatic ductal dilatation or surrounding inflammatory changes. Spleen: Normal in size without focal abnormality. Adrenals/Urinary Tract: No adrenal masses. Bilateral renal cortical thinning. There are small bilateral low-density renal masses consistent with cysts. No stones. No hydronephrosis. Ureters are normal in course and in caliber. Bladder is unremarkable. Stomach/Bowel: Stomach unremarkable other than the hiatal hernia. Small bowel is normal in caliber. No wall thickening or inflammation. Bowel anastomosis staple line lies along the lower sigmoid colon just above the rectum. No colonic wall thickening or inflammation. No evidence of obstruction. Appendix not discretely seen. No findings of appendicitis. Vascular/Lymphatic: Aortic atherosclerosis. No enlarged abdominal or pelvic lymph nodes. Reproductive: Status post hysterectomy. No adnexal masses. Other:  No abdominal wall hernia or abnormality. No abdominopelvic ascites. Musculoskeletal: No fracture or acute finding. Levoscoliosis of the lumbar spine. Degenerative changes noted of the lumbar spine. No osteoblastic or osteolytic lesions. IMPRESSION: 1. Lungs demonstrate multiple small nodules on the right, largest measuring a mean of 11 mm. Suspect that these are infectious/inflammatory in etiology given the history. Non-contrast chest CT at 3-6 months is recommended. If the nodules are stable at time of repeat CT, then future CT at 18-24 months (from today's scan) is considered optional for low-risk patients, but is recommended for high-risk patients. This recommendation follows the consensus statement: Guidelines for Management of Incidental Pulmonary Nodules Detected on CT Images: From the Fleischner Society 2017; Radiology 2017; 284:228-243. 2. Lungs show a right greater than left interstitial thickening with minimal pleural effusions. Findings are consistent with mild asymmetric interstitial pulmonary edema. 3. Dependent opacity at the lung bases, right greater than left, most consistent with atelectasis. No convincing pneumonia. 4. No acute findings within the abdomen or pelvis. 5. Hepatic steatosis.  Status post hysterectomy. 6. Aortic atherosclerosis. Electronically Signed   By: Lajean Manes M.D.   On: 12/21/2017 13:17   Dg Chest Port 1 View  Result Date: 12/22/2017 CLINICAL DATA:  Sepsis. EXAM: PORTABLE CHEST 1 VIEW COMPARISON:  CT chest from yesterday. Chest x-ray dated Dec 20, 2017. FINDINGS: The heart size and mediastinal contours are within normal limits. Diffusely increased interstitial markings, similar to prior CT. Small right pleural effusion, unchanged. Known small nodules in the right lung are not well visualized by x-ray. No pneumothorax. No acute osseous abnormality. IMPRESSION: 1. Stable interstitial edema and small right pleural effusion. 2. Known small nodules in the right lung are not  well visualized by x-ray. Electronically Signed   By: Titus Dubin M.D.   On: 12/22/2017 08:59   Scheduled Meds: . amLODipine  10 mg Oral Daily  . B-complex with vitamin C  1 tablet Oral Daily  . Chlorhexidine Gluconate Cloth  6 each Topical Q0600  . docusate sodium  200 mg Oral QHS  . [START ON 12/24/2017] doxycycline  100 mg Oral Q12H  . gabapentin  300 mg Oral BID  . ketoconazole  1 application Topical BID  . levothyroxine  112 mcg Oral QAC breakfast  . mouth rinse  15 mL Mouth Rinse BID  . multivitamin with minerals  1 tablet Oral Daily  . mupirocin ointment  1 application Nasal BID  . pantoprazole  40 mg Oral Daily  . polyethylene glycol  17 g Oral BID  . polyvinyl alcohol  1 drop Both Eyes BID  . predniSONE  7.5 mg Oral Q breakfast  . psyllium  1 packet Oral BID  . timolol  1 drop Left Eye Daily   Continuous Infusions: .  doxycycline (VIBRAMYCIN) IV Stopped (12/23/17 0021)  . piperacillin-tazobactam (ZOSYN)  IV 3.375 g (12/23/17 0300)  . vancomycin Stopped (12/22/17 2233)    Principal Problem:   [facility] spotted fever Active Problems:   Rheumatoid arthritis ([place])   Essential hypertension, benign   Chronic pain syndrome   Hypothyroidism   LFTs abnormal   Leukopenia   Thrombocytopenia ([place])   Hyponatremia  Time spent:   Irwin Brakeman, MD, FAAFP [facility] Pager [phone]  If 7PM-7AM, please contact night-coverage [email or web address] Password [email or web address] 12/23/2017, 8:27 AM    LOS: 3 days

## 2017-12-24 MED ORDER — HYDROMORPHONE HCL 2 MG PO TABS: 2.0000 mg | ORAL_TABLET | Freq: Four times a day (QID) | ORAL | 0 refills | Status: DC | PRN

## 2017-12-24 MED ORDER — GABAPENTIN 300 MG PO CAPS: 300.0000 mg | ORAL_CAPSULE | Freq: Two times a day (BID) | ORAL | 3 refills | Status: DC

## 2017-12-24 MED ORDER — DOXYCYCLINE HYCLATE 100 MG PO TABS: 100.0000 mg | ORAL_TABLET | Freq: Two times a day (BID) | ORAL | 0 refills | Status: AC

## 2017-12-24 MED ORDER — TEMAZEPAM 30 MG PO CAPS: 30.0000 mg | ORAL_CAPSULE | Freq: Every evening | ORAL | 0 refills | Status: DC | PRN

## 2017-12-24 NOTE — Care Management Note (Signed)
Case Management Note  Patient Details  Name: Sabrina Adams MRN: [id number] Date of Birth: 1945-12-07  Patient elects to go home with home health PT instead of recommended SNF. She has ambulating with nursing staff today using a RW and tolerated well. Husband reports patient has BSC and RW at home. Offered choice of Home health agencies. No preference. Juliann Pulse of [facility] notified and will obtain orders via Epic. Patient/family aware [facility] has 48 hours to make initial assessment.    Expected Discharge Date:  12/24/17               Expected Discharge Plan:  [place]  In-House Referral:     Discharge planning Services  CM Consult  Post Acute Care Choice:  Home Health Choice offered to:  Patient, Spouse  DME Arranged:    DME Agency:     HH Arranged:  PT [place]:  [place]  Status of Service:  Completed, signed off  If discussed at [place] of Stay Meetings, dates discussed:    Additional Comments:  Sherah Lund, Chauncey [place], RN 12/24/2017, 1:01 PM

## 2017-12-24 NOTE — Discharge Instructions (Signed)
Follow with Primary MD  Kathyrn Drown, MD  and other consultants as instructed your Hospitalist MD  Please get a complete blood count and chemistry panel checked by your Primary MD at your next visit, and again as instructed by your Primary MD.  Get Medicines reviewed and adjusted: Please take all your medications with you for your next visit with your Primary MD  Laboratory/radiological data: Please request your Primary MD to go over all hospital tests and procedure/radiological results at the follow up, please ask your Primary MD to get all Hospital records sent to his/her office.  In some cases, they will be blood work, cultures and biopsy results pending at the time of your discharge. Please request that your primary care M.D. follows up on these results.  Also Note the following: If you experience worsening of your admission symptoms, develop shortness of breath, life threatening emergency, suicidal or homicidal thoughts you must seek medical attention immediately by calling 911 or calling your MD immediately  if symptoms less severe.  You must read complete instructions/literature along with all the possible adverse reactions/side effects for all the Medicines you take and that have been prescribed to you. Take any new Medicines after you have completely understood and accpet all the possible adverse reactions/side effects.   Do not drive when taking Pain medications or sleeping medications (Benzodaizepines)  Do not take more than prescribed Pain, Sleep and Anxiety Medications. It is not advisable to combine anxiety,sleep and pain medications without talking with your primary care practitioner  Special Instructions: If you have smoked or chewed Tobacco  in the last 2 yrs please stop smoking, stop any regular Alcohol  and or any Recreational drug use.  Wear Seat belts while driving.  Please note: You were cared for by a hospitalist during your hospital stay. Once you are discharged,  your primary care physician will handle any further medical issues. Please note that NO REFILLS for any discharge medications will be authorized once you are discharged, as it is imperative that you return to your primary care physician (or establish a relationship with a primary care physician if you do not have one) for your post hospital discharge needs so that they can reassess your need for medications and monitor your lab values.      Sepsis, Adult Sepsis is a serious bodily reaction to an infection. The infection that causes sepsis may be from a bacteria, a virus, a fungus, or a parasite. Sepsis can result from an infection in any part of the body. Infections that commonly lead to sepsis include skin, lung, and urinary tract infections. Sepsis is a medical emergency that requires immediate treatment at the hospital. In severe cases, it can lead to septic shock. Shock can weaken the heart and cause blood pressure to drop. This can make the central nervous system and the body's organs to stop working. What are the causes? This condition is caused by a severe reaction to a bacterial, viral, fungal, or parasitic infection. The germs that most commonly lead to sepsis include:  Escherichia coli (E. coli).  Staphylococcus aureus (staph).  The most common infections that lead to sepsis include infections of:  The skin.  The lung (pneumonia).  The gut.  The kidneys (urinary tract infection).  What increases the risk? You are more likely to develop this condition if:  You have a weakened disease-fighting (immune) system.  You are 30 or older.  You are female.  You had surgery, or you have been  hospitalized.  You have a catheter, breathing tube, or drainage tubes inserted into your body.  You are not getting enough nutrients from food (are malnourished).  You have other long-term (chronic) diseases, including: ? Cancer. ? AIDS. ? Liver disease. ? Lung  disease. ? Diabetes.  You have severe burns or injuries.  You inject drugs.  You have heart valve problems.  What are the signs or symptoms? Symptoms of this condition may include:  Fever.  Chills or feeling very cold.  Fast heart rate (tachycardia).  Rapid breathing (hyperventilation).  Shortness of breath.  Confusion or light-headedness.  Changes in skin color. Your skin may look blotchy, pale, or blue.  Cool, clammy skin or sweaty skin.  Skin rash.  Nausea and vomiting.  Urinating much less than usual.  How is this diagnosed? This condition is diagnosed based on:  Your symptoms.  Your medical history.  A physical exam.  Other tests may also be done to find out the cause of the infection and how severe the sepsis is. These tests may include:  Blood tests.  Urine tests.  Swabs from other areas of the body that may have an infection. These samples may be tested (cultured) to find out what type of bacteria is causing the infection.  Chest X-ray to check for pneumonia. Other imaging tests, such as a CT scan, may also be done.  Lumbar puncture. This is a procedure to remove a small amount of the fluid that surrounds the brain and spinal cord. The fluid is then examined for infection.  How is this treated? This condition is treated in a hospital with antibiotic medicines. You may also receive:  Fluids through an IV tube.  Oxygen and breathing assistance.  Kidney dialysis. This process cleans the blood if the kidneys have failed.  Surgery to remove infected tissue.  Medicines to increase your blood pressure.  Nutrients to correct imbalances in basic body function (metabolism). This may involve receiving important salts and minerals (electrolytes) through an IV and having your blood sugar level adjusted.  Steroid medicines to control your bodys reaction to the infection.  Follow these instructions at home: Medicines  Take over-the-counter and  prescription medicines only as told by your health care provider.  If you were prescribed an antibiotic or anti-fungal medicine, take it as told by your health care provider. Do not stop taking the antibiotic or anti-fungal medicine even if you start to feel better. Activity  Rest and gradually return to your normal activities. Ask your health care provider what activities are safe for you.  Try to set small, achievable goals each week, such as dressing yourself, bathing, or walking up stairs. It may take a while to rebuild your strength.  Try to exercise regularly, if you feel healthy enough to do so. Ask your health care provider what exercises are safe for you. General instructions  Drink enough fluid to keep your urine clear or pale yellow.  Eat a healthy, balanced diet. This includes plenty of fruits and vegetables, whole grains, and lowfat (lean) proteins. Ask your health care provider if you should avoid certain foods.  Keep all follow-up visits as told by your health care provider. This is important. Contact a health care provider if:  You do not feel like you are getting better or regaining strength.  You are having trouble coping with your recovery.  You frequently feel tired.  You feel worse or do not seem to get better after surgery.  You think you  may have an infection after surgery. Get help right away if:  You have any symptoms of sepsis.  You have difficulty breathing.  You have a rapid or skipping heartbeat.  You become confused.  You have a high fever.  Your skin becomes blotchy, pale, or blue. These symptoms may represent a serious problem that is an emergency. Do not wait to see if the symptoms will go away. Get medical help right away. Call your local emergency services (911 in the [place] Summary  Sepsis is a medical emergency that requires immediate treatment at the hospital.  This condition is caused by a severe reaction to a bacterial, viral,  fungal, or parasitic infection.  This condition is treated in a hospital with antibiotics. Treatment may also include IV fluids, breathing assistance, and kidney dialysis.  If you were prescribed an antibiotic or anti-fungal medicine, take it as told by your health care provider. Do not stop taking the antibiotic or anti-fungal medicine even if you start to feel better. This information is not intended to replace advice given to you by your health care provider. Make sure you discuss any questions you have with your health care provider. Document Released: 04/22/2003 Document Revised: 06/27/2016 Document Reviewed: 06/27/2016 Elsevier Interactive Patient Education  2018 [place], Adult Ticks are insects that can bite. Most ticks live in shrubs and grassy areas. They climb onto people and animals that go by. Then they bite. Some ticks carry germs that can make you sick. How can I prevent tick bites?  Use an insect repellent that has 20% or higher of the ingredients DEET, picaridin, or IR3535. Put this insect repellent on: ? Bare skin. ? The tops of your boots. ? Your pant legs. ? The ends of your sleeves.  If you use an insect repellent that has the ingredient permethrin, make sure to follow the instructions on the bottle. Treat the following: ? Clothing. ? Supplies. ? Boots. ? Tents.  Wear long sleeves, long pants, and light colors.  Tuck your pant legs into your socks.  Stay in the middle of the trail.  Try not to walk through long grass.  Before going inside your house, check your clothes, hair, and skin for ticks. Make sure to check your head, neck, armpits, waist, groin, and joint areas.  Check for ticks every day.  When you come indoors: ? Wash your clothes right away. ? Shower right away. ? Dry your clothes in a dryer on high heat for 60 minutes or more. What is the right way to remove a tick? Remove a tick from your skin as soon as  possible.  To remove a tick that is crawling on your skin: ? Go outdoors and brush the tick off. ? Use tape or a lint roller.  To remove a tick that is biting: ? Wash your hands. ? If you have latex gloves, put them on. ? Use tweezers, curved forceps, or a tick-removal tool to grasp the tick. Grasp the tick as close to your skin and as close to the tick's head as possible. ? Gently pull up until the tick lets go.  Try to keep the tick's head attached to its body.  Do not twist or jerk the tick.  Do not squeeze or crush the tick.  Do not try to remove a tick with heat, alcohol, petroleum jelly, or fingernail polish. How should I get rid of a tick? Here are some ways to get rid of  a tick that is alive:  Place the tick in rubbing alcohol.  Place the tick in a bag or container you can close tightly.  Wrap the tick tightly in tape.  Flush the tick down the toilet.  Contact a doctor if:  You have symptoms of a disease, such as: ? Pain in a muscle, joint, or bone. ? Trouble walking or moving your legs. ? Numbness in your legs. ? Inability to move (paralysis). ? A red rash that makes a circle (bull's-eye rash). ? Redness and swelling where the tick bit you. ? A fever. ? Throwing up (vomiting) over and over. ? Diarrhea. ? Weight loss. ? Tender and swollen lymph glands. ? Shortness of breath. ? Cough. ? Belly pain (abdominal pain). ? Headache. ? Being more tired than normal. ? A change in how alert (conscious) you are. ? Confusion. Get help right away if:  You cannot remove a tick.  A part of a tick breaks off and gets stuck in your skin.  You are feeling worse. Summary  Ticks may carry germs that can make you sick.  To prevent tick bites, wear long sleeves, long pants, and light colors. Use insect repellent. Follow the instructions on the bottle.  If the tick is biting, do not try to remove it with heat, alcohol, petroleum jelly, or fingernail polish.  Use  tweezers, curved forceps, or a tick-removal tool to grasp the tick. Gently pull up until the tick lets go. Do not twist or jerk the tick. Do not squeeze or crush the tick.  If you have symptoms, contact a doctor. This information is not intended to replace advice given to you by your health care provider. Make sure you discuss any questions you have with your health care provider. Document Released: 10/18/2009 Document Revised: 11/03/2016 Document Reviewed: 11/03/2016 Elsevier Interactive Patient Education  2018 [employer].    Antibiotic Medicine, Adult Antibiotic medicines are used to treat infections caused by bacteria, such as strep throat and urinary tract infection (UTI). Antibiotic medicines will not work for viral illnesses, such as colds or the flu (influenza). They work by killing the bacteria that is making you sick. Antibiotics can also have serious side effects. It is important that you take antibiotic medicines safely and only when needed. When do I need to take antibiotics? Antibiotics are medicines that treat bacterial infections. You may need antibiotics for:  UTI.  Strep throat.  Meningitis. This infection affects the spinal cord and brain.  Bacterial sinusitis.  Serious lung infection.  You may start antibiotics while your health care provider waits for test results to come back. Common tests may include throat, urine, blood, or mucus culture. Your health care provider may change or stop the antibiotic depending on your test results. When are antibiotics not needed? You do not need antibiotics for most common illnesses. These illnesses may be caused by a virus, not a bacteria. You do not need antibiotics for:  The common cold.  Influenza.  Sore throat.  Discolored mucus.  Bronchitis.  Antibiotics are not always needed for all bacterial infections. Many of these infections clear up without antibiotic treatment. Do not ask for or take antibiotics when they are  not necessary. How long should I take the antibiotic? You must take the entire prescription. Continue to take your antibiotic for as long as told by your health care provider. Do not stop taking it even if you start to feel better. If you stop taking it too soon:  You may start to feel sick again.  Your infection may become harder to treat.  Complications may develop.  Each course of antibiotics needs a different amount of time to work. Some antibiotic courses last only a few days. Some last about a week to 10 days. In some cases, you may need to take antibiotics for a few weeks to completely treat the infection. What if I miss a dose? Try not to miss any doses of medicine. If you miss a dose, call your health care provider or pharmacist for advice. Sometimes it is okay to take the missed dose as soon as possible. What are the risks of taking antibiotics? Most antibiotics can cause an infection called Clostridium difficile (C. difficile), which causes severe diarrhea. This infection happens when the antibiotics kill the healthy bacteria in your intestines. This allows C. difficile to grow. The infection needs to be treated right away. Let your health care provider know if:  You have diarrhea while taking an antibiotic.  You have diarrhea after you stop taking an antibiotic. C. difficile infection can start weeks after stopping the antibiotic.  Taking an antibiotic also puts you at risk for getting a bacteria that does not respond to medicine (antibiotic-resistant infection) in the future. Antibiotics can cause bacteria to change so that if the antibiotic is taken again, the medicine is not able to kill the bacteria. These infections can be more serious and, in some cases, life-threatening. Do antibiotics affect birth control? Birth control pills may not work while you are on antibiotics. If you are taking birth control pills, continue taking them as usual and use a second form of birth control,  such as a condom, to avoid unwanted pregnancy. Continue using the second form of birth control until your health care provider says you can stop. What else should I know about taking antibiotics? It is important for you to take antibiotics exactly as told. Make sure that you:  Take the entire course of antibiotic that was prescribed. Do not stop taking your antibiotics even if your symptoms improve.  Take the correct amount of medicine each day.  Ask your health care provider: ? How long to wait in between doses. ? If the antibiotic should be taken with food. ? If there are any foods, drinks, or medicines that you should avoid while taking the antibiotics. ? If there are any side effects you should be aware of.  Only use the antibiotics prescribed for you by your health care provider. Do not use antibiotics prescribed for someone else.  Drink a large glass of water along with the antibiotics.  Ask the pharmacist for a syringe, cup, or spoon that properly measures the antibiotics.  Throw away any leftover medicine.  Contact a health care provider if:  Your symptoms get worse.  You have new joint pain or muscle aches that begin after starting the antibiotic. When should I seek immediate medical care?  You have signs of a serious allergic reaction to antibiotics. If you have signs of a severe allergic reaction, stop taking the antibiotic right away. Signs may include: ? Hives, which are raised, itchy, red bumps on the skin. ? Skin rash. ? Trouble breathing. ? A wheezing sound when you breathe. ? Swelling anywhere on your body. ? Feeling dizzy. ? Vomiting.  Your urine turns dark or becomes blood-colored.  Your skin turns yellow.  You bruise or bleed easily.  You have severe diarrhea and abdominal cramps.  You have a severe headache.  Summary  Antibiotic medicines are used to treat infections caused by bacteria, such as strep throat and UTIs. It is important that you take  antibiotic medicines only when needed.  Your health care provider may change or stop the antibiotic depending on your test results.  Most antibiotics can cause an infection called Clostridium difficile (C. difficile), which causes severe diarrhea. Let your health care provider know if you develop diarrhea while taking an antibiotic.  Take the entire course of antibiotic that was prescribed. This information is not intended to replace advice given to you by your health care provider. Make sure you discuss any questions you have with your health care provider. Document Released: 04/05/2004 Document Revised: 07/25/2016 Document Reviewed: 07/25/2016 Elsevier Interactive Patient Education  [employer].

## 2017-12-24 NOTE — Progress Notes (Signed)
Patient discharged with personal belongings, signed prescriptions, and I removed and site intact.

## 2017-12-24 NOTE — Clinical Social Work Note (Signed)
Received CSW referral today to see pt for SNF STR placement. Met with pt this AM. Pt states she is feeling much better and would like to go home with [facility]. Discussed with pt's RN who removed pt's O2 and walked her. Per RN, pt did very well with a walker and did not need any assistance. Spoke with pt's husband by phone at pt request, and he expresses that he is very happy for her to come home with [facility] if she is ambulating that well. He states that he feels pt is doing better right now than she has been doing in the last month.   Updated RN CM, PT, and MD.  Will sign off. Please re-consult if needs change.

## 2017-12-24 NOTE — Discharge Summary (Signed)
Physician Discharge Summary  Sabrina Adams QJJ:[id number] DOB: [birth year] DOA: 12/20/2017  PCP: Sabrina Drown, MD  Admit date: 12/20/2017 Discharge date: 12/24/2017  Admitted From: HOME  Disposition: SKILLED REHAB  Recommendations for Outpatient Follow-up:  1. Follow up with PCP in 1 weeks 2. Follow with rheumatologist as scheduled.  3. Follow up with Sabrina Adams as scheduled.  4. Please obtain BMP/CBC in one week 5. Please follow up on the following pending results: LYME DISEASE TESTING and FINAL CULTURES  Discharge Condition: STABLE   CODE STATUS: FULL    Brief Hospitalization Summary: Please see all hospital notes, images, labs for full details of the hospitalization.  HPI: Sabrina Adams is a 72 y.o. female with medical history significant for rheumatoid arthritis, hypertension, hypothyroidism, chronic back pain, and chronic constipation, now presenting to the emergency department for evaluation of fevers, headache, nausea, and malaise.  Patient removed a tick from the medial aspect of her right upper extremity approximately 3 weeks ago, has been dealing with acute on chronic constipation since that time, and then developed headache with subjective fevers, nausea, and general malaise a few days ago.  Her condition worsened today with increased fever, severe nausea without much vomiting, and persistent headache.  She denies change in vision or hearing, and denies focal numbness, weakness, or confusion.  She denies cough, shortness of breath, or dysuria.  Denies rash.  ED Course: Upon arrival to the ED, patient is found to be febrile to 39.3 C slightly tachycardic, and with vitals otherwise, stable.  Chemistry panel is notable for a hyponatremia to 132 and elevations in transaminases with AST 214 and ALT 135.  CBC is notable for leukopenia to 2600 and a thrombocytopenia with platelets 69,000.  Lactic acid is mildly elevated to 2.08.  Blood cultures were collected, RMSF and Lyme  serologies were sent, 2 L of normal saline was administered, and the patient was treated with Tylenol, doxycycline, fentanyl, and Zofran in the ED.  She remains hemodynamically stable and will be admitted to the medical-surgical unit for ongoing evaluation and management of suspected RMSF.   Brief Admission Hx: Sabrina Adams a 72 y.o.femalewith medical history significant forrheumatoid arthritis, hypertension, hypothyroidism, chronic back pain, and chronic constipation, now presenting to the emergency department for evaluation of fevers, headache, nausea, and malaise.   MDM/Assessment & Plan:   1. Malaise - Clinically improving.  Pt presented with symptoms of low grade fever, malaise, nausea in setting of recent tick exposure.  She does not recall a rash.  She remains fatigued.  She has a PT evaluation pending.  She was empirically treated with doxycycline for possible RMSF or lyme disease.  Lab tests are pending for lyme but RMSF testing has been negative. Continue doxycycline for 10 more days pending lyme testing. Follow up with PCP to get final results of lyme testing.   2. Fever Unknown Origin  - RESOLVED now.  CT chest / abd / pelvis as part of work up for FUO did not show any specific infection.  Fever has responded to broad spectrum antibiotics and de-escalated antibiotics to doxycycline BID.  Pt is immunocompromised.  3. Sepsis - resolved now.  lactate down with hydration.  Cultures have been negative to date, de-esacalated antibiotics 5/19 and no recurrence of fever.  4. Leukopenia/thrombocytopenia - Held plaquenil in hospital.  Followed CBC.  Improved.  WBC now normal. Platelets up to 98.  5. Elevated Transaminases - improved as LFTs trending down now.  Pt was noted  to have hepatic steatosis on imaging.  Recheck outpatient with PCP.   6. RA - reported as stable but her disease is moderate to severe. Resume home  plaquenil and Continue home prednisone therapy.  7. Hypothyroidism -  Continue synthroid daily. 8. Chronic constipation - Multiple BMs reported, continue laxatives.  9. Pulmonary Edema - treated.  saline locked IV, IV lasix ordered and patient has diuresed.   10. Chronic back pain - stable, MRI done but no acute findings, multiple chronic findings.  PT eval recommending SNF placement, consulted social worker. Pt is agreeable to SNF rehab placement and requesting [facility].  11. Hyponatremia - resolved now.   12. Essential Hypertension - stable. Continue home amlodipine.  13. Yeast Intertrigo - fluconazole given x 1 dose and topical ketoconazole creme 2% ordered for skin folds while in hospital.   DVT prophylaxis: SCDs Code Status: Full  Family Communication: Husband Disposition Plan: SNF rehab     Discharge Diagnoses:  Principal Problem:   [place] spotted fever Active Problems:   Rheumatoid arthritis ([place])   Essential hypertension, benign   Chronic pain syndrome   Hypothyroidism   LFTs abnormal   Leukopenia   Thrombocytopenia ([facility])   Hyponatremia  Discharge Instructions: Discharge Instructions    Call MD for:  difficulty breathing, headache or visual disturbances   Complete by:  As directed    Call MD for:  extreme fatigue   Complete by:  As directed    Call MD for:  persistant dizziness or light-headedness   Complete by:  As directed    Call MD for:  persistant nausea and vomiting   Complete by:  As directed    Call MD for:  redness, tenderness, or signs of infection (pain, swelling, redness, odor or green/yellow discharge around incision site)   Complete by:  As directed    Call MD for:  severe uncontrolled pain   Complete by:  As directed    Increase activity slowly   Complete by:  As directed      Allergies as of 12/24/2017      Reactions   Betadine [povidone Iodine] Itching   "Burns"   Cinnamon Anaphylaxis   Throat and Tongue    Codeine Nausea Only   Patient can become woozy   Other Nausea And Vomiting, Other (See  Comments)   Pt states "all narcotic pain medications make me sick to my stomach and sometimes make me woozy   Actonel [risedronate Sodium]    stomach   Adhesive [tape] Other (See Comments)   Irritates skin    Lidocaine    Methotrexate Derivatives    Elevated liver enzymes      Medication List    STOP taking these medications   promethazine 25 MG tablet Commonly known as:  PHENERGAN   traMADol 50 MG tablet Commonly known as:  ULTRAM     TAKE these medications   acetaminophen 650 MG CR tablet Commonly known as:  TYLENOL Take 650 mg by mouth 2 (two) times daily. What changed:  Another medication with the same name was removed. Continue taking this medication, and follow the directions you see here.   amLODipine 10 MG tablet Commonly known as:  NORVASC Take 1 tablet (10 mg total) by mouth daily.   b complex vitamins tablet Take 1 tablet by mouth daily.   CALCIUM 600 + D PO Take 1 tablet by mouth daily.   docusate sodium 100 MG capsule Commonly known as:  COLACE Take 200  mg by mouth at bedtime.   doxycycline 100 MG tablet Commonly known as:  VIBRA-TABS Take 1 tablet (100 mg total) by mouth every 12 (twelve) hours for 10 days.   gabapentin 300 MG capsule Commonly known as:  NEURONTIN Take 1 capsule (300 mg total) by mouth 2 (two) times daily. TAKE 1 CAPSULE three TIMES DAILY What changed:    how much to take  how to take this  when to take this   HYDROmorphone 2 MG tablet Commonly known as:  DILAUDID Take 1 tablet (2 mg total) by mouth every 6 (six) hours as needed for severe pain. One po four times daily as needed What changed:    how much to take  how to take this  when to take this  reasons to take this   hydroxychloroquine 200 MG tablet Commonly known as:  PLAQUENIL Take 1 tablet (200 mg total) by mouth 2 (two) times daily.   levothyroxine 112 MCG tablet Commonly known as:  SYNTHROID, LEVOTHROID Take 1 tablet (112 mcg total) by mouth daily.    multivitamin with minerals Tabs tablet Take 1 tablet by mouth daily.   pantoprazole 40 MG tablet Commonly known as:  PROTONIX Take 1 tablet (40 mg total) by mouth daily.   polyethylene glycol packet Commonly known as:  MIRALAX / GLYCOLAX Take 17 g by mouth daily as needed for mild constipation. Constipation   predniSONE 2.5 MG tablet Commonly known as:  DELTASONE Take 7.5 mg by mouth daily with breakfast.   PROBIOTIC DAILY PO Take by mouth daily.   psyllium 0.52 g capsule Commonly known as:  REGULOID Take 1.04 g by mouth 2 (two) times daily. 2 tablets twice daily.   RECLAST IV Inject into the vein. yearly   SYSTANE BALANCE OP Apply to eye 2 (two) times daily. Patient states that she uses 1 drop in both eye.   temazepam 30 MG capsule Commonly known as:  RESTORIL Take 1 capsule (30 mg total) by mouth at bedtime as needed for sleep. Sleep What changed:  reasons to take this   timolol 0.5 % ophthalmic solution Commonly known as:  TIMOPTIC Place 1 drop into the left eye daily.   tiZANidine 2 MG tablet Commonly known as:  ZANAFLEX Take 1 tablet (2 mg total) by mouth every 8 (eight) hours as needed.   Vitamin D3 5000 units Tabs Take by mouth. Once a week      Follow-up Information    Luking, Elayne Snare, MD. Schedule an appointment as soon as possible for a visit in 2 week(s).   Specialty:  Family Medicine Contact information: [place] [id number] [phone]          Allergies  Allergen Reactions  . Betadine [Povidone Iodine] Itching    "Burns"  . Cinnamon Anaphylaxis    Throat and Tongue   . Codeine Nausea Only    Patient can become woozy  . Other Nausea And Vomiting and Other (See Comments)    Pt states "all narcotic pain medications make me sick to my stomach and sometimes make me woozy  . Actonel [Risedronate Sodium]     stomach  . Adhesive [Tape] Other (See Comments)    Irritates skin   . Lidocaine   . Methotrexate  Derivatives     Elevated liver enzymes   Allergies as of 12/24/2017      Reactions   Betadine [povidone Iodine] Itching   "Burns"   Cinnamon Anaphylaxis  Throat and Tongue    Codeine Nausea Only   Patient can become woozy   Other Nausea And Vomiting, Other (See Comments)   Pt states "all narcotic pain medications make me sick to my stomach and sometimes make me woozy   Actonel [risedronate Sodium]    stomach   Adhesive [tape] Other (See Comments)   Irritates skin    Lidocaine    Methotrexate Derivatives    Elevated liver enzymes      Medication List    STOP taking these medications   promethazine 25 MG tablet Commonly known as:  PHENERGAN   traMADol 50 MG tablet Commonly known as:  ULTRAM     TAKE these medications   acetaminophen 650 MG CR tablet Commonly known as:  TYLENOL Take 650 mg by mouth 2 (two) times daily. What changed:  Another medication with the same name was removed. Continue taking this medication, and follow the directions you see here.   amLODipine 10 MG tablet Commonly known as:  NORVASC Take 1 tablet (10 mg total) by mouth daily.   b complex vitamins tablet Take 1 tablet by mouth daily.   CALCIUM 600 + D PO Take 1 tablet by mouth daily.   docusate sodium 100 MG capsule Commonly known as:  COLACE Take 200 mg by mouth at bedtime.   doxycycline 100 MG tablet Commonly known as:  VIBRA-TABS Take 1 tablet (100 mg total) by mouth every 12 (twelve) hours for 10 days.   gabapentin 300 MG capsule Commonly known as:  NEURONTIN Take 1 capsule (300 mg total) by mouth 2 (two) times daily. TAKE 1 CAPSULE three TIMES DAILY What changed:    how much to take  how to take this  when to take this   HYDROmorphone 2 MG tablet Commonly known as:  DILAUDID Take 1 tablet (2 mg total) by mouth every 6 (six) hours as needed for severe pain. One po four times daily as needed What changed:    how much to take  how to take this  when to take  this  reasons to take this   hydroxychloroquine 200 MG tablet Commonly known as:  PLAQUENIL Take 1 tablet (200 mg total) by mouth 2 (two) times daily.   levothyroxine 112 MCG tablet Commonly known as:  SYNTHROID, LEVOTHROID Take 1 tablet (112 mcg total) by mouth daily.   multivitamin with minerals Tabs tablet Take 1 tablet by mouth daily.   pantoprazole 40 MG tablet Commonly known as:  PROTONIX Take 1 tablet (40 mg total) by mouth daily.   polyethylene glycol packet Commonly known as:  MIRALAX / GLYCOLAX Take 17 g by mouth daily as needed for mild constipation. Constipation   predniSONE 2.5 MG tablet Commonly known as:  DELTASONE Take 7.5 mg by mouth daily with breakfast.   PROBIOTIC DAILY PO Take by mouth daily.   psyllium 0.52 g capsule Commonly known as:  REGULOID Take 1.04 g by mouth 2 (two) times daily. 2 tablets twice daily.   RECLAST IV Inject into the vein. yearly   SYSTANE BALANCE OP Apply to eye 2 (two) times daily. Patient states that she uses 1 drop in both eye.   temazepam 30 MG capsule Commonly known as:  RESTORIL Take 1 capsule (30 mg total) by mouth at bedtime as needed for sleep. Sleep What changed:  reasons to take this   timolol 0.5 % ophthalmic solution Commonly known as:  TIMOPTIC Place 1 drop into the left eye daily.  tiZANidine 2 MG tablet Commonly known as:  ZANAFLEX Take 1 tablet (2 mg total) by mouth every 8 (eight) hours as needed.   Vitamin D3 5000 units Tabs Take by mouth. Once a week       Procedures/Studies: Ct Chest W Contrast  Result Date: 12/21/2017 CLINICAL DATA:  *Malaise - Pt presented with symptoms of low grade fever, malaise, nausea in setting of recent tick exposure. She does not recall a rash. She is being empirically treated with doxycycline for possible RMSF. Lab tests are pending. *FUO - obtain CT chest / abd / pelvis as part of work up for FUO. Pt is immunocompromised. *Sepsis - lactate trending down with  hydration. Broaden antibiotic coverage pending culture and work up. *Leukopenia/thrombocytopenia - Holding plaquenil for now. Follow CBC. *Elevated Transaminases EXAM: CT CHEST, ABDOMEN, AND PELVIS WITH CONTRAST TECHNIQUE: Multidetector CT imaging of the chest, abdomen and pelvis was performed following the standard protocol during bolus administration of intravenous contrast. CONTRAST:  176mL ISOVUE-300 IOPAMIDOL (ISOVUE-300) INJECTION 61% COMPARISON:  Chest radiograph, 12/20/2017. FINDINGS: CT CHEST FINDINGS Cardiovascular: Heart is normal in size. No pericardial effusion. No coronary artery calcifications. Great vessels normal caliber. Mild aortic atherosclerosis. Mediastinum/Nodes: Moderate size hiatal hernia. No mediastinal or hilar masses. No discrete pathologically enlarged lymph nodes. Trachea is widely patent. No esophageal distension. No neck base or axillary masses or adenopathy. Lungs and pleura: Trace pleural effusions. There is bilateral interstitial thickening that is greater on the right. There are multiple right-sided pulmonary nodules. Largest is in the right upper lobe, centered on image 60, with ill-defined margins, measuring 16 x 8 mm transversely, mean 11 mm. All remaining nodules are subcentimeter. There is additional opacity lung bases, right greater than left, consistent with atelectasis. No pneumothorax. Musculoskeletal: No fracture or acute finding. No osteoblastic or osteolytic lesions. CT ABDOMEN PELVIS FINDINGS Hepatobiliary: Liver is normal in size. There is decreased attenuation of the liver consistent with fatty infiltration. No liver mass or focal lesion. Small dependent gallstone. Gallbladder is otherwise unremarkable. Common bile duct is prominent measuring 8 mm, demonstrating distal tapering. Pancreas: Unremarkable. No pancreatic ductal dilatation or surrounding inflammatory changes. Spleen: Normal in size without focal abnormality. Adrenals/Urinary Tract: No adrenal masses.  Bilateral renal cortical thinning. There are small bilateral low-density renal masses consistent with cysts. No stones. No hydronephrosis. Ureters are normal in course and in caliber. Bladder is unremarkable. Stomach/Bowel: Stomach unremarkable other than the hiatal hernia. Small bowel is normal in caliber. No wall thickening or inflammation. Bowel anastomosis staple line lies along the lower sigmoid colon just above the rectum. No colonic wall thickening or inflammation. No evidence of obstruction. Appendix not discretely seen. No findings of appendicitis. Vascular/Lymphatic: Aortic atherosclerosis. No enlarged abdominal or pelvic lymph nodes. Reproductive: Status post hysterectomy. No adnexal masses. Other: No abdominal wall hernia or abnormality. No abdominopelvic ascites. Musculoskeletal: No fracture or acute finding. Levoscoliosis of the lumbar spine. Degenerative changes noted of the lumbar spine. No osteoblastic or osteolytic lesions. IMPRESSION: 1. Lungs demonstrate multiple small nodules on the right, largest measuring a mean of 11 mm. Suspect that these are infectious/inflammatory in etiology given the history. Non-contrast chest CT at 3-6 months is recommended. If the nodules are stable at time of repeat CT, then future CT at 18-24 months (from today's scan) is considered optional for low-risk patients, but is recommended for high-risk patients. This recommendation follows the consensus statement: Guidelines for Management of Incidental Pulmonary Nodules Detected on CT Images: From the Fleischner Society 2017; Radiology 2017;  [id number]. 2. Lungs show a right greater than left interstitial thickening with minimal pleural effusions. Findings are consistent with mild asymmetric interstitial pulmonary edema. 3. Dependent opacity at the lung bases, right greater than left, most consistent with atelectasis. No convincing pneumonia. 4. No acute findings within the abdomen or pelvis. 5. Hepatic steatosis.   Status post hysterectomy. 6. Aortic atherosclerosis. Electronically Signed   By: Lajean Manes M.D.   On: 12/21/2017 13:17   Mr Thoracic Spine W Wo Contrast  Result Date: 12/20/2017 CLINICAL DATA:  Fever, constipation malaise. History of lumbar spine surgery. EXAM: MRI THORACIC AND LUMBAR SPINE WITHOUT AND WITH CONTRAST TECHNIQUE: Multiplanar and multiecho pulse sequences of the thoracic and lumbar spine were obtained without and with intravenous contrast. CONTRAST:  78mL MULTIHANCE GADOBENATE DIMEGLUMINE 529 MG/ML IV SOLN COMPARISON:  Lumbar spine radiographs January 10, 2017. CT abdomen and pelvis October 13, 2010 and MRI lumbar spine Jan 03, 2005. FINDINGS: MRI THORACIC SPINE FINDINGS-moderately motion degraded examination. ALIGNMENT: Maintenance of the thoracic kyphosis. C7-T1 anterolisthesis. Mild broad dextroscoliosis. VERTEBRAE/DISCS: Vertebral bodies are intact. Moderate to severe C5-6 through T7-8 degenerative discs with mild chronic discogenic endplate changes. No abnormal or acute bone marrow signal. Scattered old Schmorl's nodes. No abnormal osseous or disc enhancement. CORD: Thoracic spinal cord is normal morphology and signal characteristics. No abnormal cord, leptomeningeal or epidural enhancement. PREVERTEBRAL AND PARASPINAL SOFT TISSUES: Nonacute. Large hiatal hernia. DISC LEVELS: Moderate T11-12 RIGHT central disc protrusion versus extrusion without significant migration, enhancing annular fissure. Mild T11-12 canal stenosis with encroachment upon the traversing RIGHT T12 nerve. No neural foraminal narrowing at any level. MRI LUMBAR SPINE FINDINGS-moderately motion degraded examination. SEGMENTATION: For the purposes of this report, the last well-formed intervertebral disc is reported as L5-S1. ALIGNMENT: Maintained lumbar lordosis. Grade 1 L4-5 anterolisthesis and grade 1 L5-S1 retrolisthesis. Lower lumbar scoliosis better demonstrated on prior radiographs. VERTEBRAE: Vertebral bodies are intact.  Severe progressed disc height loss L2-3 through L5-S1 with disc desiccation and moderate to severe chronic discogenic endplate changes. No acute or abnormal bone marrow signal. No abnormal osseous or disc enhancement. CONUS MEDULLARIS AND CAUDA EQUINA: Conus medullaris terminates at T12-L1 and demonstrates normal morphology and signal characteristics. Limited assessment of cauda equina due to patient motion. No abnormal cord, leptomeningeal or epidural enhancement. PARASPINAL AND OTHER SOFT TISSUES: Moderate to severe paraspinal muscle atrophy at and below the level surgical interventions. DISC LEVELS: T12-L1: No disc bulge, canal stenosis nor neural foraminal narrowing. L1-2: Old laminectomies. Mild facet arthropathy without canal stenosis or neural foraminal narrowing. L2-3: Old laminectomies. Small broad-based disc osteophyte complex. No canal stenosis. Mild LEFT neural foraminal narrowing. L3-4: Old laminectomies. Moderate broad-based disc osteophyte complex. Moderate canal stenosis. Mild RIGHT, severe LEFT neural foraminal narrowing. L4-5: Old laminectomies. Anterolisthesis. Small broad-based disc bulge. Severe facet arthropathy. Mild-to-moderate canal stenosis. Mild neural foraminal narrowing. L5-S1: Retrolisthesis. Small broad-based disc osteophyte complex, moderate facet arthropathy without canal stenosis. Mild RIGHT neural foraminal narrowing. IMPRESSION: MRI thoracic spine: 1. Moderately motion degraded examination. 2. No fracture, malalignment or acute osseous process. No suspicious enhancement. 3. Moderate T11-12 disc protrusion versus extrusion resulting in mild canal stenosis. MRI lumbar spine: 1. Moderately motion degraded examination. 2. No fracture or acute osseous process. No suspicious enhancement. 3. Grade 1 L4-5 anterolisthesis and grade 1 L5-S1 retrolisthesis. Old L1-2 through L4-5 laminectomies. 4. Moderate canal stenosis L3-4, mild to moderate L4-5. 5. Neural foraminal narrowing L2-3 through  L5-S1: Severe on the LEFT at L3-4. Electronically Signed   By: Elon Alas  M.D.   On: 12/20/2017 18:45   Mr Lumbar Spine W Wo Contrast  Result Date: 12/20/2017 CLINICAL DATA:  Fever, constipation malaise. History of lumbar spine surgery. EXAM: MRI THORACIC AND LUMBAR SPINE WITHOUT AND WITH CONTRAST TECHNIQUE: Multiplanar and multiecho pulse sequences of the thoracic and lumbar spine were obtained without and with intravenous contrast. CONTRAST:  66mL MULTIHANCE GADOBENATE DIMEGLUMINE 529 MG/ML IV SOLN COMPARISON:  Lumbar spine radiographs January 10, 2017. CT abdomen and pelvis October 13, 2010 and MRI lumbar spine Jan 03, 2005. FINDINGS: MRI THORACIC SPINE FINDINGS-moderately motion degraded examination. ALIGNMENT: Maintenance of the thoracic kyphosis. C7-T1 anterolisthesis. Mild broad dextroscoliosis. VERTEBRAE/DISCS: Vertebral bodies are intact. Moderate to severe C5-6 through T7-8 degenerative discs with mild chronic discogenic endplate changes. No abnormal or acute bone marrow signal. Scattered old Schmorl's nodes. No abnormal osseous or disc enhancement. CORD: Thoracic spinal cord is normal morphology and signal characteristics. No abnormal cord, leptomeningeal or epidural enhancement. PREVERTEBRAL AND PARASPINAL SOFT TISSUES: Nonacute. Large hiatal hernia. DISC LEVELS: Moderate T11-12 RIGHT central disc protrusion versus extrusion without significant migration, enhancing annular fissure. Mild T11-12 canal stenosis with encroachment upon the traversing RIGHT T12 nerve. No neural foraminal narrowing at any level. MRI LUMBAR SPINE FINDINGS-moderately motion degraded examination. SEGMENTATION: For the purposes of this report, the last well-formed intervertebral disc is reported as L5-S1. ALIGNMENT: Maintained lumbar lordosis. Grade 1 L4-5 anterolisthesis and grade 1 L5-S1 retrolisthesis. Lower lumbar scoliosis better demonstrated on prior radiographs. VERTEBRAE: Vertebral bodies are intact. Severe progressed  disc height loss L2-3 through L5-S1 with disc desiccation and moderate to severe chronic discogenic endplate changes. No acute or abnormal bone marrow signal. No abnormal osseous or disc enhancement. CONUS MEDULLARIS AND CAUDA EQUINA: Conus medullaris terminates at T12-L1 and demonstrates normal morphology and signal characteristics. Limited assessment of cauda equina due to patient motion. No abnormal cord, leptomeningeal or epidural enhancement. PARASPINAL AND OTHER SOFT TISSUES: Moderate to severe paraspinal muscle atrophy at and below the level surgical interventions. DISC LEVELS: T12-L1: No disc bulge, canal stenosis nor neural foraminal narrowing. L1-2: Old laminectomies. Mild facet arthropathy without canal stenosis or neural foraminal narrowing. L2-3: Old laminectomies. Small broad-based disc osteophyte complex. No canal stenosis. Mild LEFT neural foraminal narrowing. L3-4: Old laminectomies. Moderate broad-based disc osteophyte complex. Moderate canal stenosis. Mild RIGHT, severe LEFT neural foraminal narrowing. L4-5: Old laminectomies. Anterolisthesis. Small broad-based disc bulge. Severe facet arthropathy. Mild-to-moderate canal stenosis. Mild neural foraminal narrowing. L5-S1: Retrolisthesis. Small broad-based disc osteophyte complex, moderate facet arthropathy without canal stenosis. Mild RIGHT neural foraminal narrowing. IMPRESSION: MRI thoracic spine: 1. Moderately motion degraded examination. 2. No fracture, malalignment or acute osseous process. No suspicious enhancement. 3. Moderate T11-12 disc protrusion versus extrusion resulting in mild canal stenosis. MRI lumbar spine: 1. Moderately motion degraded examination. 2. No fracture or acute osseous process. No suspicious enhancement. 3. Grade 1 L4-5 anterolisthesis and grade 1 L5-S1 retrolisthesis. Old L1-2 through L4-5 laminectomies. 4. Moderate canal stenosis L3-4, mild to moderate L4-5. 5. Neural foraminal narrowing L2-3 through L5-S1: Severe on  the LEFT at L3-4. Electronically Signed   By: Elon Alas M.D.   On: 12/20/2017 18:45   US Abdomen Complete  Result Date: 12/21/2017 CLINICAL DATA:  Elevated transaminase level. EXAM: ABDOMEN ULTRASOUND COMPLETE COMPARISON:  CT scan of same day. FINDINGS: Gallbladder: 7 mm gallstone is noted. No gallbladder wall thickening or pericholecystic fluid is noted. No sonographic Murphy's sign is noted. Common bile duct: Diameter: 2 mm which is within normal limits. Liver: 1.3 cm cyst is  noted centrally. Within normal limits in parenchymal echogenicity. Portal vein is patent on color Doppler imaging with normal direction of blood flow towards the liver. IVC: No abnormality visualized. Pancreas: Visualized portion unremarkable. Spleen: Size and appearance within normal limits. Right Kidney: Length: 11.1 cm. Increased echogenicity of renal parenchyma is noted. No mass or hydronephrosis visualized. Left Kidney: Length: 12.1 cm. Increased echogenicity of renal parenchyma is noted. No mass or hydronephrosis visualized. Abdominal aorta: No aneurysm visualized. Other findings: None. IMPRESSION: Small solitary gallstone.  No evidence of cholecystitis. Increased echogenicity of renal parenchyma is noted bilaterally suggesting medical renal disease. Electronically Signed   By: Marijo Conception, M.D.   On: 12/21/2017 14:32   Ct Abdomen Pelvis W Contrast  Result Date: 12/21/2017 CLINICAL DATA:  *Malaise - Pt presented with symptoms of low grade fever, malaise, nausea in setting of recent tick exposure. She does not recall a rash. She is being empirically treated with doxycycline for possible RMSF. Lab tests are pending. *FUO - obtain CT chest / abd / pelvis as part of work up for FUO. Pt is immunocompromised. *Sepsis - lactate trending down with hydration. Broaden antibiotic coverage pending culture and work up. *Leukopenia/thrombocytopenia - Holding plaquenil for now. Follow CBC. *Elevated Transaminases EXAM: CT CHEST,  ABDOMEN, AND PELVIS WITH CONTRAST TECHNIQUE: Multidetector CT imaging of the chest, abdomen and pelvis was performed following the standard protocol during bolus administration of intravenous contrast. CONTRAST:  153mL ISOVUE-300 IOPAMIDOL (ISOVUE-300) INJECTION 61% COMPARISON:  Chest radiograph, 12/20/2017. FINDINGS: CT CHEST FINDINGS Cardiovascular: Heart is normal in size. No pericardial effusion. No coronary artery calcifications. Great vessels normal caliber. Mild aortic atherosclerosis. Mediastinum/Nodes: Moderate size hiatal hernia. No mediastinal or hilar masses. No discrete pathologically enlarged lymph nodes. Trachea is widely patent. No esophageal distension. No neck base or axillary masses or adenopathy. Lungs and pleura: Trace pleural effusions. There is bilateral interstitial thickening that is greater on the right. There are multiple right-sided pulmonary nodules. Largest is in the right upper lobe, centered on image 60, with ill-defined margins, measuring 16 x 8 mm transversely, mean 11 mm. All remaining nodules are subcentimeter. There is additional opacity lung bases, right greater than left, consistent with atelectasis. No pneumothorax. Musculoskeletal: No fracture or acute finding. No osteoblastic or osteolytic lesions. CT ABDOMEN PELVIS FINDINGS Hepatobiliary: Liver is normal in size. There is decreased attenuation of the liver consistent with fatty infiltration. No liver mass or focal lesion. Small dependent gallstone. Gallbladder is otherwise unremarkable. Common bile duct is prominent measuring 8 mm, demonstrating distal tapering. Pancreas: Unremarkable. No pancreatic ductal dilatation or surrounding inflammatory changes. Spleen: Normal in size without focal abnormality. Adrenals/Urinary Tract: No adrenal masses. Bilateral renal cortical thinning. There are small bilateral low-density renal masses consistent with cysts. No stones. No hydronephrosis. Ureters are normal in course and in  caliber. Bladder is unremarkable. Stomach/Bowel: Stomach unremarkable other than the hiatal hernia. Small bowel is normal in caliber. No wall thickening or inflammation. Bowel anastomosis staple line lies along the lower sigmoid colon just above the rectum. No colonic wall thickening or inflammation. No evidence of obstruction. Appendix not discretely seen. No findings of appendicitis. Vascular/Lymphatic: Aortic atherosclerosis. No enlarged abdominal or pelvic lymph nodes. Reproductive: Status post hysterectomy. No adnexal masses. Other: No abdominal wall hernia or abnormality. No abdominopelvic ascites. Musculoskeletal: No fracture or acute finding. Levoscoliosis of the lumbar spine. Degenerative changes noted of the lumbar spine. No osteoblastic or osteolytic lesions. IMPRESSION: 1. Lungs demonstrate multiple small nodules on the right, largest measuring  a mean of 11 mm. Suspect that these are infectious/inflammatory in etiology given the history. Non-contrast chest CT at 3-6 months is recommended. If the nodules are stable at time of repeat CT, then future CT at 18-24 months (from today's scan) is considered optional for low-risk patients, but is recommended for high-risk patients. This recommendation follows the consensus statement: Guidelines for Management of Incidental Pulmonary Nodules Detected on CT Images: From the Fleischner Society 2017; Radiology 2017; 284:228-243. 2. Lungs show a right greater than left interstitial thickening with minimal pleural effusions. Findings are consistent with mild asymmetric interstitial pulmonary edema. 3. Dependent opacity at the lung bases, right greater than left, most consistent with atelectasis. No convincing pneumonia. 4. No acute findings within the abdomen or pelvis. 5. Hepatic steatosis.  Status post hysterectomy. 6. Aortic atherosclerosis. Electronically Signed   By: Lajean Manes M.D.   On: 12/21/2017 13:17   Dg Chest Port 1 View  Result Date:  12/22/2017 CLINICAL DATA:  Sepsis. EXAM: PORTABLE CHEST 1 VIEW COMPARISON:  CT chest from yesterday. Chest x-ray dated Dec 20, 2017. FINDINGS: The heart size and mediastinal contours are within normal limits. Diffusely increased interstitial markings, similar to prior CT. Small right pleural effusion, unchanged. Known small nodules in the right lung are not well visualized by x-ray. No pneumothorax. No acute osseous abnormality. IMPRESSION: 1. Stable interstitial edema and small right pleural effusion. 2. Known small nodules in the right lung are not well visualized by x-ray. Electronically Signed   By: Titus Dubin M.D.   On: 12/22/2017 08:59   Dg Chest Port 1 View  Result Date: 12/20/2017 CLINICAL DATA:  Patient states abdominal pain, nausea, vomiting, and constipation since Sunday. Fever at home reached 103.9. Hx of HTN. EXAM: PORTABLE CHEST 1 VIEW COMPARISON:  10/25/2005 FINDINGS: Heart is enlarged. There are prominent interstitial markings consistent with mild pulmonary edema. Suspect hiatal hernia. IMPRESSION: 1. Cardiomegaly and mild interstitial edema. 2. Hiatal hernia. Electronically Signed   By: Nolon Nations M.D.   On: 12/20/2017 18:42      Subjective: Pt says she feels a lot better.  She has no fever or chills.  Her weakness is slightly improved.   Discharge Exam: Vitals:   12/23/17 2156 12/24/17 0602  BP: (!) 114/55 135/81  Pulse: 74 89  Resp: 20 18  Temp: 98.6 F (37 C) 98.3 F (36.8 C)  SpO2: 95% 98%   Vitals:   12/23/17 1030 12/23/17 2039 12/23/17 2156 12/24/17 0602  BP: 119/60  (!) 114/55 135/81  Pulse: 73  74 89  Resp:   20 18  Temp: 97.9 F (36.6 C)  98.6 F (37 C) 98.3 F (36.8 C)  TempSrc: Oral  Oral Oral  SpO2: 96% 98% 95% 98%  Weight:      Height:        General exam: awake, alert, NAD. Cooperative.  Respiratory system: Clear. No increased work of breathing. Cardiovascular system: S1 & S2 heard, RRR. No JVD, murmurs, gallops, clicks or pedal  edema. Gastrointestinal system: Abdomen is nondistended, soft and nontender. Normal bowel sounds heard. Central nervous system: Alert and oriented. No focal neurological deficits. Extremities:  Chronic deformity of right ankle and right hand with severe arthritic changes,  No cyanosis.   The results of significant diagnostics from this hospitalization (including imaging, microbiology, ancillary and laboratory) are listed below for reference.     Microbiology: Recent Results (from the past 240 hour(s))  Urine culture     Status: Abnormal  Collection Time: 12/20/17  4:36 PM  Result Value Ref Range Status   Specimen Description   Final    URINE, CLEAN CATCH Performed at [facility], [address], [place], [place] [address]    Special Requests   Final    NONE Performed at [facility], [address] [place], [place] [address]    Culture (A)  Final    <10,000 COLONIES/mL INSIGNIFICANT GROWTH Performed at [facility] [address] [place], [place] [address]    Report Status 12/22/2017 FINAL  Final  Blood Culture (routine x 2)     Status: None (Preliminary result)   Collection Time: 12/20/17  5:14 PM  Result Value Ref Range Status   Specimen Description BLOOD LEFT WRIST  Final   Special Requests   Final    BOTTLES DRAWN AEROBIC AND ANAEROBIC Blood Culture adequate volume   Culture   Final    NO GROWTH 4 DAYS Performed at [facility], [address] [place], [place] [address]    Report Status PENDING  Incomplete  Blood Culture (routine x 2)     Status: None (Preliminary result)   Collection Time: 12/20/17  5:15 PM  Result Value Ref Range Status   Specimen Description LEFT ANTECUBITAL  Final   Special Requests   Final    BOTTLES DRAWN AEROBIC AND ANAEROBIC Blood Culture adequate volume   Culture   Final    NO GROWTH 4 DAYS Performed at [facility], [address], [place], [place] [address]    Report Status PENDING  Incomplete  MRSA PCR Screening     Status:  Abnormal   Collection Time: 12/20/17 11:33 PM  Result Value Ref Range Status   MRSA by PCR POSITIVE (A) NEGATIVE Final    Comment:        The GeneXpert MRSA Assay (FDA approved for NASAL specimens only), is one component of a comprehensive MRSA colonization surveillance program. It is not intended to diagnose MRSA infection nor to guide or monitor treatment for MRSA infections. RESULT CALLED TO, READ BACK BY AND VERIFIED WITH: HOBBS A. AT 0803A ON [id number] BY THOMPSON S. Performed at [facility], [address] [place], [place] [address]      Labs: BNP (last 3 results) No results for input(s): BNP in the last 8760 hours. Basic Metabolic Panel: Recent Labs  Lab 12/20/17 1335 12/21/17 0507 12/22/17 0817 12/23/17 0603  NA 132* 130* 133* 140  K 3.8 3.8 3.2* 3.7  CL 93* 96* 96* 100*  CO2 27 23 26 31   GLUCOSE 99 72 88 69  BUN 20 16 13 19   CREATININE 0.88 0.71 0.68 0.82  CALCIUM 8.5* 7.4* 7.4* 7.6*  MG  --   --   --  1.7   Liver Function Tests: Recent Labs  Lab 12/20/17 1335 12/21/17 0507 12/22/17 0817 12/23/17 0603  AST 214* 205* 191* 145*  ALT 135* 103* 86* 71*  ALKPHOS 181* 147* 127* 116  BILITOT 0.7 1.0 0.8 0.7  PROT 6.4* 5.4* 5.6* 5.3*  ALBUMIN 3.4* 2.8* 2.8* 2.5*   Recent Labs  Lab 12/20/17 1335  LIPASE 32   No results for input(s): AMMONIA in the last 168 hours. CBC: Recent Labs  Lab 12/20/17 1335 12/21/17 0507 12/22/17 0817 12/23/17 0603  WBC 2.6* 3.6* 4.4 4.2  NEUTROABS  --  2.9 2.9 1.6*  HGB 14.1 12.9 13.1 12.2  HCT 43.3 39.3 39.8 37.4  MCV 93.1 93.3 90.5 92.6  PLT 69* 63*  78* 98*   Cardiac Enzymes: No results for input(s): CKTOTAL, CKMB, CKMBINDEX, TROPONINI in the last 168 hours. BNP: Invalid input(s): POCBNP CBG: No results for input(s): GLUCAP in the last 168 hours. D-Dimer No results for input(s): DDIMER in the last 72 hours. Hgb A1c No results for input(s): HGBA1C in the last 72 hours. Lipid Profile No results for input(s):  CHOL, HDL, LDLCALC, TRIG, CHOLHDL, LDLDIRECT in the last 72 hours. Thyroid function studies No results for input(s): TSH, T4TOTAL, T3FREE, THYROIDAB in the last 72 hours.  Invalid input(s): FREET3 Anemia work up No results for input(s): VITAMINB12, FOLATE, FERRITIN, TIBC, IRON, RETICCTPCT in the last 72 hours. Urinalysis    Component Value Date/Time   COLORURINE AMBER (A) 12/20/2017 1608   APPEARANCEUR HAZY (A) 12/20/2017 1608   LABSPEC 1.024 12/20/2017 1608   PHURINE 5.0 12/20/2017 1608   GLUCOSEU NEGATIVE 12/20/2017 1608   HGBUR NEGATIVE 12/20/2017 1608   BILIRUBINUR NEGATIVE 12/20/2017 1608   KETONESUR NEGATIVE 12/20/2017 1608   PROTEINUR 30 (A) 12/20/2017 1608   NITRITE NEGATIVE 12/20/2017 1608   LEUKOCYTESUR NEGATIVE 12/20/2017 1608   Sepsis Labs Invalid input(s): PROCALCITONIN,  WBC,  LACTICIDVEN Microbiology Recent Results (from the past 240 hour(s))  Urine culture     Status: Abnormal   Collection Time: 12/20/17  4:36 PM  Result Value Ref Range Status   Specimen Description   Final    URINE, CLEAN CATCH Performed at [facility], [address], [place], [place] [address]    Special Requests   Final    NONE Performed at [facility], [address], [place], [place] [address]    Culture (A)  Final    <10,000 COLONIES/mL INSIGNIFICANT GROWTH Performed at [facility], [place] [address] [place], [place] [address]    Report Status 12/22/2017 FINAL  Final  Blood Culture (routine x 2)     Status: None (Preliminary result)   Collection Time: 12/20/17  5:14 PM  Result Value Ref Range Status   Specimen Description BLOOD LEFT WRIST  Final   Special Requests   Final    BOTTLES DRAWN AEROBIC AND ANAEROBIC Blood Culture adequate volume   Culture   Final    NO GROWTH 4 DAYS Performed at [facility], [address] [place], [place] [address]    Report Status PENDING  Incomplete  Blood Culture (routine x 2)     Status: None (Preliminary result)   Collection Time:  12/20/17  5:15 PM  Result Value Ref Range Status   Specimen Description LEFT ANTECUBITAL  Final   Special Requests   Final    BOTTLES DRAWN AEROBIC AND ANAEROBIC Blood Culture adequate volume   Culture   Final    NO GROWTH 4 DAYS Performed at [facility], [address], [place], [place] [address]    Report Status PENDING  Incomplete  MRSA PCR Screening     Status: Abnormal   Collection Time: 12/20/17 11:33 PM  Result Value Ref Range Status   MRSA by PCR POSITIVE (A) NEGATIVE Final    Comment:        The GeneXpert MRSA Assay (FDA approved for NASAL specimens only), is one component of a comprehensive MRSA colonization surveillance program. It is not intended to diagnose MRSA infection nor to guide or monitor treatment for MRSA infections. RESULT CALLED TO, READ BACK BY AND VERIFIED WITH: HOBBS A. AT [id number] ON [id number] BY THOMPSON S. Performed at [facility], [address], [place], [place] [address]  Time coordinating discharge: 36 minutes  SIGNED:  Irwin Brakeman, MD  [facility] 12/24/2017, 11:15 AM Pager [phone]  If 7PM-7AM, please contact night-coverage [email or web address] Password [email or web address]

## 2017-12-24 NOTE — Care Management Important Message (Signed)
Important Message  Patient Details  Name: Sabrina Adams MRN: [id number] Date of Birth: [birth year]   Medicare Important Message Given:  Yes    Shelda Altes 12/24/2017, 11:29 AM

## 2017-12-24 NOTE — Progress Notes (Signed)
Patient walked down the hall to the nurses station and back to her room with minimal assistance using walker.

## 2017-12-24 NOTE — Progress Notes (Signed)
12/24/2017 12:56 PM  Pt changed her mind and decided not to go to SNF.  Will go home with home health PT.  [place] orders completed.  Murvin Natal MD

## 2017-12-25 LAB — CULTURE, BLOOD (ROUTINE X 2)
Culture: NO GROWTH
Culture: NO GROWTH
Special Requests: ADEQUATE
Special Requests: ADEQUATE

## 2017-12-27 DIAGNOSIS — Z8673 Personal history of transient ischemic attack (TIA), and cerebral infarction without residual deficits: Secondary | ICD-10-CM | POA: Diagnosis not present

## 2017-12-27 DIAGNOSIS — M069 Rheumatoid arthritis, unspecified: Secondary | ICD-10-CM | POA: Diagnosis not present

## 2017-12-27 DIAGNOSIS — Z7952 Long term (current) use of systemic steroids: Secondary | ICD-10-CM | POA: Diagnosis not present

## 2017-12-27 DIAGNOSIS — G894 Chronic pain syndrome: Secondary | ICD-10-CM | POA: Diagnosis not present

## 2017-12-27 DIAGNOSIS — D696 Thrombocytopenia, unspecified: Secondary | ICD-10-CM | POA: Diagnosis not present

## 2017-12-27 DIAGNOSIS — G4730 Sleep apnea, unspecified: Secondary | ICD-10-CM | POA: Diagnosis not present

## 2017-12-27 DIAGNOSIS — K5909 Other constipation: Secondary | ICD-10-CM | POA: Diagnosis not present

## 2017-12-27 DIAGNOSIS — E039 Hypothyroidism, unspecified: Secondary | ICD-10-CM | POA: Diagnosis not present

## 2017-12-27 DIAGNOSIS — I10 Essential (primary) hypertension: Secondary | ICD-10-CM | POA: Diagnosis not present

## 2017-12-27 DIAGNOSIS — Z79899 Other long term (current) drug therapy: Secondary | ICD-10-CM | POA: Diagnosis not present

## 2017-12-27 DIAGNOSIS — K219 Gastro-esophageal reflux disease without esophagitis: Secondary | ICD-10-CM | POA: Diagnosis not present

## 2017-12-27 DIAGNOSIS — D649 Anemia, unspecified: Secondary | ICD-10-CM | POA: Diagnosis not present

## 2017-12-27 DIAGNOSIS — G629 Polyneuropathy, unspecified: Secondary | ICD-10-CM | POA: Diagnosis not present

## 2017-12-27 DIAGNOSIS — Z79891 Long term (current) use of opiate analgesic: Secondary | ICD-10-CM | POA: Diagnosis not present

## 2017-12-27 DIAGNOSIS — A770 Spotted fever due to Rickettsia rickettsii: Secondary | ICD-10-CM | POA: Diagnosis not present

## 2017-12-28 DIAGNOSIS — M069 Rheumatoid arthritis, unspecified: Secondary | ICD-10-CM | POA: Diagnosis not present

## 2017-12-28 DIAGNOSIS — G629 Polyneuropathy, unspecified: Secondary | ICD-10-CM | POA: Diagnosis not present

## 2017-12-28 DIAGNOSIS — I10 Essential (primary) hypertension: Secondary | ICD-10-CM | POA: Diagnosis not present

## 2017-12-28 DIAGNOSIS — D696 Thrombocytopenia, unspecified: Secondary | ICD-10-CM | POA: Diagnosis not present

## 2017-12-28 DIAGNOSIS — G894 Chronic pain syndrome: Secondary | ICD-10-CM | POA: Diagnosis not present

## 2017-12-28 DIAGNOSIS — A770 Spotted fever due to Rickettsia rickettsii: Secondary | ICD-10-CM | POA: Diagnosis not present

## 2017-12-31 DIAGNOSIS — A770 Spotted fever due to Rickettsia rickettsii: Secondary | ICD-10-CM | POA: Diagnosis not present

## 2017-12-31 DIAGNOSIS — G894 Chronic pain syndrome: Secondary | ICD-10-CM | POA: Diagnosis not present

## 2017-12-31 DIAGNOSIS — I10 Essential (primary) hypertension: Secondary | ICD-10-CM | POA: Diagnosis not present

## 2017-12-31 DIAGNOSIS — M069 Rheumatoid arthritis, unspecified: Secondary | ICD-10-CM | POA: Diagnosis not present

## 2017-12-31 DIAGNOSIS — G629 Polyneuropathy, unspecified: Secondary | ICD-10-CM | POA: Diagnosis not present

## 2017-12-31 DIAGNOSIS — D696 Thrombocytopenia, unspecified: Secondary | ICD-10-CM | POA: Diagnosis not present

## 2018-01-01 ENCOUNTER — Telehealth: Payer: Self-pay | Admitting: Family Medicine

## 2018-01-01 NOTE — Telephone Encounter (Signed)
Please write out an order for this I will sign it, diagnosis weakness, ataxia, [facility] spotted fever

## 2018-01-01 NOTE — Telephone Encounter (Signed)
Patient is needing Rx for a walker that can go through a narrow walkway.  The name is EZ fold N go walker.  [place] [facility]

## 2018-01-02 NOTE — Telephone Encounter (Addendum)
Order faxed to [place]. Patient notified.

## 2018-01-03 DIAGNOSIS — M069 Rheumatoid arthritis, unspecified: Secondary | ICD-10-CM | POA: Diagnosis not present

## 2018-01-03 DIAGNOSIS — A770 Spotted fever due to Rickettsia rickettsii: Secondary | ICD-10-CM | POA: Diagnosis not present

## 2018-01-03 DIAGNOSIS — G894 Chronic pain syndrome: Secondary | ICD-10-CM | POA: Diagnosis not present

## 2018-01-03 DIAGNOSIS — G629 Polyneuropathy, unspecified: Secondary | ICD-10-CM | POA: Diagnosis not present

## 2018-01-03 DIAGNOSIS — D696 Thrombocytopenia, unspecified: Secondary | ICD-10-CM | POA: Diagnosis not present

## 2018-01-03 DIAGNOSIS — I10 Essential (primary) hypertension: Secondary | ICD-10-CM | POA: Diagnosis not present

## 2018-01-07 DIAGNOSIS — A770 Spotted fever due to Rickettsia rickettsii: Secondary | ICD-10-CM | POA: Diagnosis not present

## 2018-01-07 DIAGNOSIS — G629 Polyneuropathy, unspecified: Secondary | ICD-10-CM | POA: Diagnosis not present

## 2018-01-07 DIAGNOSIS — M069 Rheumatoid arthritis, unspecified: Secondary | ICD-10-CM | POA: Diagnosis not present

## 2018-01-07 DIAGNOSIS — G894 Chronic pain syndrome: Secondary | ICD-10-CM | POA: Diagnosis not present

## 2018-01-07 DIAGNOSIS — D696 Thrombocytopenia, unspecified: Secondary | ICD-10-CM | POA: Diagnosis not present

## 2018-01-07 DIAGNOSIS — I10 Essential (primary) hypertension: Secondary | ICD-10-CM | POA: Diagnosis not present

## 2018-01-09 ENCOUNTER — Ambulatory Visit (INDEPENDENT_AMBULATORY_CARE_PROVIDER_SITE_OTHER): Payer: Medicare Other | Admitting: Family Medicine

## 2018-01-09 ENCOUNTER — Encounter: Payer: Self-pay | Admitting: Family Medicine

## 2018-01-09 VITALS — BP 130/72 | Ht 65.0 in | Wt 175.0 lb

## 2018-01-09 DIAGNOSIS — I10 Essential (primary) hypertension: Secondary | ICD-10-CM | POA: Diagnosis not present

## 2018-01-09 DIAGNOSIS — D696 Thrombocytopenia, unspecified: Secondary | ICD-10-CM

## 2018-01-09 DIAGNOSIS — G894 Chronic pain syndrome: Secondary | ICD-10-CM

## 2018-01-09 DIAGNOSIS — R945 Abnormal results of liver function studies: Secondary | ICD-10-CM

## 2018-01-09 DIAGNOSIS — E038 Other specified hypothyroidism: Secondary | ICD-10-CM | POA: Diagnosis not present

## 2018-01-09 DIAGNOSIS — R7989 Other specified abnormal findings of blood chemistry: Secondary | ICD-10-CM

## 2018-01-09 MED ORDER — TRAMADOL HCL 50 MG PO TABS: 50.0000 mg | ORAL_TABLET | Freq: Four times a day (QID) | ORAL | 5 refills | Status: DC

## 2018-01-09 MED ORDER — PROMETHAZINE HCL 25 MG PO TABS: 25.0000 mg | ORAL_TABLET | Freq: Two times a day (BID) | ORAL | 5 refills | Status: DC | PRN

## 2018-01-09 MED ORDER — GABAPENTIN 300 MG PO CAPS: 300.0000 mg | ORAL_CAPSULE | Freq: Two times a day (BID) | ORAL | 5 refills | Status: DC

## 2018-01-09 NOTE — Progress Notes (Signed)
   Subjective:    Patient ID: Sabrina Adams, female    DOB: 04-Dec-1945, 72 y.o.   MRN: [id number]  HPI Patient arrives for a recent hospital follow up. [place] awaiting results of Lymes disease panel.    Review of Systems  Constitutional: Negative for activity change, appetite change and fatigue.  HENT: Negative for congestion and rhinorrhea.   Respiratory: Negative for cough and shortness of breath.   Cardiovascular: Negative for chest pain and leg swelling.  Gastrointestinal: Negative for abdominal pain, constipation and diarrhea.  Endocrine: Negative for polydipsia and polyphagia.  Genitourinary: Negative for dysuria, flank pain and frequency.  Musculoskeletal: Positive for arthralgias, back pain and gait problem.  Skin: Negative for color change.  Neurological: Negative for dizziness and weakness.  Psychiatric/Behavioral: Negative for agitation and confusion.       Objective:   Physical Exam  Constitutional: She appears well-developed and well-nourished. No distress.  HENT:  Head: Normocephalic and atraumatic.  Eyes: Right eye exhibits no discharge. Left eye exhibits no discharge.  Neck: No tracheal deviation present.  Cardiovascular: Normal rate, regular rhythm and normal heart sounds.  No murmur heard. Pulmonary/Chest: Effort normal and breath sounds normal. No respiratory distress.  Musculoskeletal: She exhibits deformity. She exhibits no edema or tenderness.  Lymphadenopathy:    She has no cervical adenopathy.  Neurological: She is alert. She exhibits normal muscle tone.  Skin: Skin is warm and dry.  Psychiatric: She has a normal mood and affect. Her behavior is normal.  Vitals reviewed.  25 minutes was spent with the patient.  This statement verifies that 25 minutes was indeed spent with the patient. Greater than half the time was spent in discussion, counseling and answering questions  regarding the issues that the patient came in for today as reflected in the  diagnosis (s) please refer to documentation for further details.  Time spent discussing her pain medicine discussing her hospital stay discussing lab results it is apparent that they mention LYME Titer but never did do the test this was explained to the patient Greater than half the time spent in discussing and answering questions     Assessment & Plan:  Chronic back pain also pain from rheumatoid arthritis uses tramadol 4 times per day safely.  Reinstate this  Uses Dilaudid intermittently patient aware of drowsiness  HTN- Patient was seen today as part of a visit regarding hypertension. The importance of healthy diet and regular physical activity was discussed. The importance of compliance with medications discussed.  Ideal goal is to keep blood pressure low elevated levels certainly below 597/41 when possible.  The patient was counseled that keeping blood pressure under control lessen his risk of complications.  The importance of regular follow-ups was discussed with the patient.  Low-salt diet such as DASH recommended.  Regular physical activity was recommended as well.  Patient was advised to keep regular follow-ups.  Thyroid-hypothyroidism continue current medication watch diet closely  Recent infection probable tick related illness does need follow-up lab work to look at liver enzymes Also had thrombocytopenia needs follow-up lab work Also has chronic insomnia uses temazepam as needed Follow-up 4 months

## 2018-01-10 ENCOUNTER — Telehealth: Payer: Self-pay | Admitting: Family Medicine

## 2018-01-10 DIAGNOSIS — M069 Rheumatoid arthritis, unspecified: Secondary | ICD-10-CM | POA: Diagnosis not present

## 2018-01-10 DIAGNOSIS — G629 Polyneuropathy, unspecified: Secondary | ICD-10-CM | POA: Diagnosis not present

## 2018-01-10 DIAGNOSIS — I10 Essential (primary) hypertension: Secondary | ICD-10-CM | POA: Diagnosis not present

## 2018-01-10 DIAGNOSIS — G894 Chronic pain syndrome: Secondary | ICD-10-CM | POA: Diagnosis not present

## 2018-01-10 DIAGNOSIS — A770 Spotted fever due to Rickettsia rickettsii: Secondary | ICD-10-CM | POA: Diagnosis not present

## 2018-01-10 DIAGNOSIS — D696 Thrombocytopenia, unspecified: Secondary | ICD-10-CM | POA: Diagnosis not present

## 2018-01-10 LAB — CBC WITH DIFFERENTIAL/PLATELET
BASOS: 0 %
Basophils Absolute: 0 10*3/uL (ref 0.0–0.2)
EOS (ABSOLUTE): 0 10*3/uL (ref 0.0–0.4)
EOS: 0 %
HEMOGLOBIN: 14.4 g/dL (ref 11.1–15.9)
Hematocrit: 42.8 % (ref 34.0–46.6)
IMMATURE GRANS (ABS): 0 10*3/uL (ref 0.0–0.1)
IMMATURE GRANULOCYTES: 0 %
LYMPHS: 27 %
Lymphocytes Absolute: 1.6 10*3/uL (ref 0.7–3.1)
MCH: 30.7 pg (ref 26.6–33.0)
MCHC: 33.6 g/dL (ref 31.5–35.7)
MCV: 91 fL (ref 79–97)
MONOS ABS: 0.4 10*3/uL (ref 0.1–0.9)
Monocytes: 7 %
NEUTROS PCT: 66 %
Neutrophils Absolute: 3.8 10*3/uL (ref 1.4–7.0)
Platelets: 286 10*3/uL (ref 150–450)
RBC: 4.69 x10E6/uL (ref 3.77–5.28)
RDW: 14.8 % (ref 12.3–15.4)
WBC: 5.9 10*3/uL (ref 3.4–10.8)

## 2018-01-10 LAB — BASIC METABOLIC PANEL
BUN/Creatinine Ratio: 18 (ref 12–28)
BUN: 16 mg/dL (ref 8–27)
CALCIUM: 10.1 mg/dL (ref 8.7–10.3)
CO2: 24 mmol/L (ref 20–29)
CREATININE: 0.87 mg/dL (ref 0.57–1.00)
Chloride: 100 mmol/L (ref 96–106)
GFR, EST AFRICAN AMERICAN: 79 mL/min/{1.73_m2} (ref >59)
GFR, EST NON AFRICAN AMERICAN: 68 mL/min/{1.73_m2} (ref >59)
Glucose: 104 mg/dL — ABNORMAL HIGH (ref 65–99)
Potassium: 4.5 mmol/L (ref 3.5–5.2)
Sodium: 141 mmol/L (ref 134–144)

## 2018-01-10 LAB — HEPATIC FUNCTION PANEL
ALT: 22 IU/L (ref 0–32)
AST: 33 IU/L (ref 0–40)
Albumin: 4.2 g/dL (ref 3.6–4.8)
Alkaline Phosphatase: 111 IU/L (ref 39–117)
BILIRUBIN TOTAL: 0.4 mg/dL (ref 0.0–1.2)
BILIRUBIN, DIRECT: 0.11 mg/dL (ref 0.00–0.40)
Total Protein: 7.2 g/dL (ref 6.0–8.5)

## 2018-01-10 NOTE — Telephone Encounter (Signed)
Review lab results in results folder from [place].

## 2018-01-11 NOTE — Telephone Encounter (Signed)
Notified patient, pt verbalized understanding.

## 2018-01-11 NOTE — Telephone Encounter (Signed)
Nurses-please let the patient be aware that her kidney functions look normal liver enzymes are back to normal and also her platelets are normal white blood count normal hemoglobin normal.  Therefore I feel that she is back to her normal baseline when it comes to her labs-this is good news.

## 2018-01-15 DIAGNOSIS — G894 Chronic pain syndrome: Secondary | ICD-10-CM | POA: Diagnosis not present

## 2018-01-15 DIAGNOSIS — A770 Spotted fever due to Rickettsia rickettsii: Secondary | ICD-10-CM | POA: Diagnosis not present

## 2018-01-15 DIAGNOSIS — D696 Thrombocytopenia, unspecified: Secondary | ICD-10-CM | POA: Diagnosis not present

## 2018-01-15 DIAGNOSIS — M069 Rheumatoid arthritis, unspecified: Secondary | ICD-10-CM | POA: Diagnosis not present

## 2018-01-15 DIAGNOSIS — G629 Polyneuropathy, unspecified: Secondary | ICD-10-CM | POA: Diagnosis not present

## 2018-01-15 DIAGNOSIS — I10 Essential (primary) hypertension: Secondary | ICD-10-CM | POA: Diagnosis not present

## 2018-01-17 DIAGNOSIS — M069 Rheumatoid arthritis, unspecified: Secondary | ICD-10-CM | POA: Diagnosis not present

## 2018-01-17 DIAGNOSIS — A770 Spotted fever due to Rickettsia rickettsii: Secondary | ICD-10-CM | POA: Diagnosis not present

## 2018-01-17 DIAGNOSIS — D696 Thrombocytopenia, unspecified: Secondary | ICD-10-CM | POA: Diagnosis not present

## 2018-01-17 DIAGNOSIS — I10 Essential (primary) hypertension: Secondary | ICD-10-CM | POA: Diagnosis not present

## 2018-01-17 DIAGNOSIS — G894 Chronic pain syndrome: Secondary | ICD-10-CM | POA: Diagnosis not present

## 2018-01-17 DIAGNOSIS — G629 Polyneuropathy, unspecified: Secondary | ICD-10-CM | POA: Diagnosis not present

## 2018-01-22 DIAGNOSIS — A770 Spotted fever due to Rickettsia rickettsii: Secondary | ICD-10-CM | POA: Diagnosis not present

## 2018-02-14 DIAGNOSIS — H401121 Primary open-angle glaucoma, left eye, mild stage: Secondary | ICD-10-CM | POA: Diagnosis not present

## 2018-03-07 DIAGNOSIS — H401121 Primary open-angle glaucoma, left eye, mild stage: Secondary | ICD-10-CM | POA: Diagnosis not present

## 2018-03-13 ENCOUNTER — Ambulatory Visit (INDEPENDENT_AMBULATORY_CARE_PROVIDER_SITE_OTHER): Payer: Medicare Other | Admitting: Family Medicine

## 2018-03-13 ENCOUNTER — Encounter: Payer: Self-pay | Admitting: Family Medicine

## 2018-03-13 VITALS — BP 138/88 | Ht 65.0 in | Wt 166.0 lb

## 2018-03-13 DIAGNOSIS — D696 Thrombocytopenia, unspecified: Secondary | ICD-10-CM

## 2018-03-13 DIAGNOSIS — Z79891 Long term (current) use of opiate analgesic: Secondary | ICD-10-CM | POA: Diagnosis not present

## 2018-03-13 DIAGNOSIS — E782 Mixed hyperlipidemia: Secondary | ICD-10-CM | POA: Diagnosis not present

## 2018-03-13 DIAGNOSIS — E038 Other specified hypothyroidism: Secondary | ICD-10-CM | POA: Diagnosis not present

## 2018-03-13 DIAGNOSIS — I10 Essential (primary) hypertension: Secondary | ICD-10-CM

## 2018-03-13 MED ORDER — AMLODIPINE BESYLATE 5 MG PO TABS: 5.0000 mg | ORAL_TABLET | Freq: Every day | ORAL | 3 refills | Status: DC

## 2018-03-13 MED ORDER — TIZANIDINE HCL 2 MG PO TABS: 2.0000 mg | ORAL_TABLET | Freq: Three times a day (TID) | ORAL | 1 refills | Status: DC | PRN

## 2018-03-13 MED ORDER — HYDROMORPHONE HCL 2 MG PO TABS: ORAL_TABLET | ORAL | 0 refills | Status: DC

## 2018-03-13 NOTE — Progress Notes (Signed)
   Subjective:    Patient ID: Sabrina Adams, female    DOB: 12/14/1945, 72 y.o.   MRN: [id number]  HPI This patient was seen today for chronic pain  The medication list was reviewed and updated.   -Compliance with medication: yes  - Number patient states they take daily:  Tramadol 3-4 times a day; Dilaudid no more than 3 times a week.   -when was the last dose patient took? Last evening.  The patient was advised the importance of maintaining medication and not using illegal substances with these.  Here for refills and follow up  The patient was educated that we can provide 3 monthly scripts for their medication, it is their responsibility to follow the instructions.  Side effects or complications from medications: Takes phenergan with med to keep from being nauseated.    Patient is aware that pain medications are meant to minimize the severity of the pain to allow their pain levels to improve to allow for better function. They are aware of that pain medications cannot totally remove their pain.  Due for UDT ( at least once per year) : done today  Also has history of hyperthyroidism hyperlipidemia and also thrombocytopenia during the illness we will be rechecking lab work  15 minutes was spent with patient today discussing healthcare issues which they came.  More than 50% of this visit-total duration of visit-was spent in counseling and coordination of care.  Please see diagnosis regarding the focus of this coordination and care      Review of Systems  Constitutional: Negative for activity change and appetite change.  HENT: Negative for congestion and rhinorrhea.   Respiratory: Negative for cough and shortness of breath.   Cardiovascular: Negative for chest pain and leg swelling.  Gastrointestinal: Negative for abdominal pain, nausea and vomiting.  Skin: Negative for color change.  Neurological: Negative for dizziness and weakness.  Psychiatric/Behavioral: Negative for  agitation and confusion.       Objective:   Physical Exam  Constitutional: She appears well-nourished. No distress.  HENT:  Head: Normocephalic and atraumatic.  Eyes: Right eye exhibits no discharge. Left eye exhibits no discharge.  Neck: No tracheal deviation present.  Cardiovascular: Normal rate, regular rhythm and normal heart sounds.  No murmur heard. Pulmonary/Chest: Effort normal and breath sounds normal. No respiratory distress.  Musculoskeletal: She exhibits no edema.  Lymphadenopathy:    She has no cervical adenopathy.  Neurological: She is alert. Coordination normal.  Skin: Skin is warm and dry.  Psychiatric: She has a normal mood and affect. Her behavior is normal.  Vitals reviewed.         Assessment & Plan:  Pain management She has chronic pain in her back and radiates into the legs Uses Dilaudid Also has chronic pain in her joints from rheumatoid arthritis Dilaudid she uses 3 or 4 times per week Otherwise uses tramadol 4 times daily States overall she is stable The medicine helps her function she denies abusing it prescriptions were reviewed and renewed patient to do lab work before next visit follow-up in 3 months  Significant joint pain back pain

## 2018-03-20 LAB — TOXASSURE SELECT 13 (MW), URINE

## 2018-04-02 DIAGNOSIS — M549 Dorsalgia, unspecified: Secondary | ICD-10-CM | POA: Diagnosis not present

## 2018-04-02 DIAGNOSIS — M79643 Pain in unspecified hand: Secondary | ICD-10-CM | POA: Diagnosis not present

## 2018-04-02 DIAGNOSIS — M810 Age-related osteoporosis without current pathological fracture: Secondary | ICD-10-CM | POA: Diagnosis not present

## 2018-04-02 DIAGNOSIS — M1990 Unspecified osteoarthritis, unspecified site: Secondary | ICD-10-CM | POA: Diagnosis not present

## 2018-04-02 DIAGNOSIS — M0579 Rheumatoid arthritis with rheumatoid factor of multiple sites without organ or systems involvement: Secondary | ICD-10-CM | POA: Diagnosis not present

## 2018-04-02 DIAGNOSIS — M25439 Effusion, unspecified wrist: Secondary | ICD-10-CM | POA: Diagnosis not present

## 2018-04-02 DIAGNOSIS — M545 Low back pain: Secondary | ICD-10-CM | POA: Diagnosis not present

## 2018-04-16 DIAGNOSIS — M810 Age-related osteoporosis without current pathological fracture: Secondary | ICD-10-CM | POA: Diagnosis not present

## 2018-04-17 ENCOUNTER — Other Ambulatory Visit: Payer: Self-pay | Admitting: *Deleted

## 2018-04-17 ENCOUNTER — Telehealth: Payer: Self-pay | Admitting: Family Medicine

## 2018-04-17 ENCOUNTER — Other Ambulatory Visit: Payer: Self-pay | Admitting: Family Medicine

## 2018-04-17 DIAGNOSIS — Z1231 Encounter for screening mammogram for malignant neoplasm of breast: Secondary | ICD-10-CM

## 2018-04-17 MED ORDER — LEVOTHYROXINE SODIUM 112 MCG PO TABS: 112.0000 ug | ORAL_TABLET | Freq: Every day | ORAL | 1 refills | Status: DC

## 2018-04-17 NOTE — Telephone Encounter (Signed)
Refill sent per dr Nicki Reaper. Pt notified.

## 2018-04-17 NOTE — Telephone Encounter (Signed)
Please send Rx for pt's levothyroxine (SYNTHROID, LEVOTHROID) 112 MCG tablet  Pt states this was supposed to have been ordered at her last OV  Please send 90 day supply to [facility]  Call pt when done

## 2018-04-23 ENCOUNTER — Ambulatory Visit (INDEPENDENT_AMBULATORY_CARE_PROVIDER_SITE_OTHER): Payer: Medicare Other | Admitting: Internal Medicine

## 2018-04-29 ENCOUNTER — Encounter (INDEPENDENT_AMBULATORY_CARE_PROVIDER_SITE_OTHER): Payer: Self-pay | Admitting: Internal Medicine

## 2018-04-29 ENCOUNTER — Ambulatory Visit (INDEPENDENT_AMBULATORY_CARE_PROVIDER_SITE_OTHER): Payer: Medicare Other | Admitting: Internal Medicine

## 2018-04-29 VITALS — BP 140/94 | HR 64 | Temp 98.3°F | Resp 18 | Ht 65.0 in | Wt 164.3 lb

## 2018-04-29 DIAGNOSIS — K5900 Constipation, unspecified: Secondary | ICD-10-CM

## 2018-04-29 DIAGNOSIS — K219 Gastro-esophageal reflux disease without esophagitis: Secondary | ICD-10-CM | POA: Diagnosis not present

## 2018-04-29 NOTE — Patient Instructions (Signed)
Take polyethylene glycol daily.  Can alternate dose between 8.5 and 17 g every other day. Call if you have right upper quadrant abdominal pain. Follow with Dr. Sallee Lange regarding lower back pain.

## 2018-04-29 NOTE — Progress Notes (Signed)
Presenting complaint;  Follow-up for GERD and constipation.  Database and subjective:  Patient is 72 year old Caucasian female who has chronic GERD and history of constipation was last seen 4 months ago. Patient returns for scheduled visit accompanied by her husband. Patient states 1 day after her last office visit she was admitted to [facility](12/20/2017) with abdominal pain and fever.  She provided history of tick bite 2 weeks earlier.  Her transaminases are elevated.  She was noted to be septic.  She was hospitalized and treated with doxycycline.  CT revealed cholelithiasis and mildly dilated bile duct measuring 8.4 mm and ultrasound few hours later revealed bile duct of less than 3 mm.  RMSF IgG and IgM titers were negative.  Patient improved and discharged on doxycycline which she took for 2 weeks.  She followed up with Dr. Wolfgang Phoenix about 2 weeks later and her transaminases are normal. She has not had any more episode of pain or fever. She says her heartburn is well controlled with therapy.  She denies nausea vomiting or dysphagia.  She remains with constipation.  She has 2-3 bowel movements per week.  She has at least one good evacuation a week.  She denies melena or rectal bleeding.  She is using polyethylene glycol on as-needed basis but not daily.  She has lost 11 pounds since her last visit. She takes 2600 mg of Tylenol every day and lately she has been taking Dilaudid twice a day for lower back pain.  Pain is described to be intractable when she tries to lie supine.  She points to right lumbosacral area as a site of pain.  She had MRI back in May but no definite etiology was found. She is on Plaquenil 400 mg daily and prednisone 7.5 mg daily for rheumatoid arthritis.   Current Medications: Outpatient Encounter Medications as of 04/29/2018  Medication Sig  . acetaminophen (TYLENOL) 650 MG CR tablet Take 650 mg by mouth 2 (two) times daily.   [name] amLODipine (NORVASC) 5 MG tablet Take 1 tablet (5 mg  total) by mouth daily.  [name] b complex vitamins tablet Take 1 tablet by mouth daily.  . Calcium Carbonate-Vitamin D (CALCIUM 600 + D PO) Take 1 tablet by mouth daily.   . Cholecalciferol (VITAMIN D3) 5000 UNITS TABS Take by mouth. Once a week  . docusate sodium (COLACE) 100 MG capsule Take 200 mg by mouth at bedtime.  . gabapentin (NEURONTIN) 300 MG capsule Take 1 capsule (300 mg total) by mouth 2 (two) times daily. 3rd dose if needed  . HYDROmorphone (DILAUDID) 2 MG tablet One po three times daily as needed  . hydroxychloroquine (PLAQUENIL) 200 MG tablet Take 1 tablet (200 mg total) by mouth 2 (two) times daily.  [name] levothyroxine (SYNTHROID, LEVOTHROID) 112 MCG tablet Take 1 tablet (112 mcg total) by mouth daily.  . Multiple Vitamin (MULTIVITAMIN WITH MINERALS) TABS Take 1 tablet by mouth daily.  . pantoprazole (PROTONIX) 40 MG tablet Take 1 tablet (40 mg total) by mouth daily.  . polyethylene glycol (MIRALAX / GLYCOLAX) packet Take 17 g by mouth daily as needed for mild constipation. Constipation   . predniSONE (DELTASONE) 2.5 MG tablet Take 7.5 mg by mouth daily with breakfast.  . Probiotic Product (PROBIOTIC DAILY PO) Take by mouth daily.  . promethazine (PHENERGAN) 25 MG tablet Take 1 tablet (25 mg total) by mouth 2 (two) times daily as needed for nausea or vomiting.  [name] Propylene Glycol (SYSTANE BALANCE OP) Apply to eye 2 (two) times  daily. Patient states that she uses 1 drop in both eye.  . psyllium (REGULOID) 0.52 G capsule Take 1.04 g by mouth 2 (two) times daily. 2 tablets twice daily.  . temazepam (RESTORIL) 30 MG capsule Take 1 capsule (30 mg total) by mouth at bedtime as needed for sleep. Sleep  . timolol (TIMOPTIC) 0.5 % ophthalmic solution Place 1 drop into the left eye daily.   [name] tiZANidine (ZANAFLEX) 2 MG tablet Take 1 tablet (2 mg total) by mouth every 8 (eight) hours as needed.  . traMADol (ULTRAM) 50 MG tablet Take 1 tablet (50 mg total) by mouth 4 (four) times daily.  .  Zoledronic Acid (RECLAST IV) Inject into the vein. yearly   No facility-administered encounter medications on file as of 04/29/2018.      Objective: Blood pressure (!) 140/94, pulse 64, temperature 98.3 F (36.8 C), temperature source Oral, resp. rate 18, height 5\' 5"  (1.651 m), weight 164 lb 4.8 oz (74.5 kg). Patient is alert and in no acute distress. Conjunctiva is pink. Sclera is nonicteric Oropharyngeal mucosa is normal. No neck masses or thyromegaly noted. Cardiac exam with regular rhythm normal S1 and S2. No murmur or gallop noted. Lungs are clear to auscultation. Abdomen was examined in sitting position.  Patient was unable to lie supine because of severe back pain.  Abdomen is soft and nontender and no masses noted. No LE edema or clubbing noted.  She has swelling to  metacarpophalangeal joints of both hands.  Labs/studies Results:  Lab data from 01/09/2018  Bilirubin 0.4, AP 111, AST 33, ALT 22, total protein 7.2 and albumin 4.2. WBC 5.9, H&H 14.4 and 42.8 and platelet count 286K  Abdominopelvic CT and ultrasound images reviewed.  Post studies were performed on 12/21/2017 2 hours apart.  Ultrasound was done later.  Assessment:  #1.  Chronic GERD.  She appears to be doing well with dietary measures and pantoprazole.  She is not having any side effects with PPI.  No changes in therapy will be made at this time.  #2.  Chronic constipation.  Constipation is primarily due to her medications.  She needs to take polyethylene glycol daily rather than as needed.  #3.  Febrile illness of May 2019 may have been due to cholangitis considering that she had mildly dilated bile duct on CT and bile duct was normal on follow-up ultrasound to 2 hours later.  She possibly passed a stone between the first and the second study.  If she has an another episode of right upper quadrant abdominal pain cholecystectomy should be considered.  #4.  Persistent pain in right lumbosacral region affecting  patient's quality of life.  Patient advised not to take more than current dose of Tylenol which is 2600 mg daily.   Plan:  Patient will continue stool softener or fiber supplement as before. She will continue pantoprazole at 40 mg p.o. every morning. Patient advised to take polyethylene glycol daily.  She can titrate the dose.  She may want to try 8.5 g alternating with 17 g every other day and if it is not enough she can take 17 g daily. Review imaging studies with the radiologist. Patient advised to call office should she have right upper quadrant abdominal pain or fever. I also contacted Dr. Sallee Lange in informed him of persistent pain in right lumbosacral region as she may need further evaluation. Office visit in 6 months.

## 2018-05-06 ENCOUNTER — Ambulatory Visit (HOSPITAL_COMMUNITY)
Admission: RE | Admit: 2018-05-06 | Discharge: 2018-05-06 | Disposition: A | Discharge status: Home / Self Care | Payer: Medicare Other | Source: Ambulatory Visit | Attending: Family Medicine | Admitting: Family Medicine

## 2018-05-06 ENCOUNTER — Ambulatory Visit (INDEPENDENT_AMBULATORY_CARE_PROVIDER_SITE_OTHER): Payer: Medicare Other | Admitting: Family Medicine

## 2018-05-06 ENCOUNTER — Encounter: Payer: Self-pay | Admitting: Family Medicine

## 2018-05-06 ENCOUNTER — Telehealth: Payer: Self-pay | Admitting: *Deleted

## 2018-05-06 VITALS — BP 130/82 | Ht 65.0 in | Wt 164.2 lb

## 2018-05-06 DIAGNOSIS — M545 Low back pain, unspecified: Secondary | ICD-10-CM

## 2018-05-06 DIAGNOSIS — M4316 Spondylolisthesis, lumbar region: Secondary | ICD-10-CM | POA: Diagnosis not present

## 2018-05-06 DIAGNOSIS — R918 Other nonspecific abnormal finding of lung field: Secondary | ICD-10-CM

## 2018-05-06 DIAGNOSIS — M419 Scoliosis, unspecified: Secondary | ICD-10-CM | POA: Insufficient documentation

## 2018-05-06 DIAGNOSIS — I700 Atherosclerosis of aorta: Secondary | ICD-10-CM | POA: Diagnosis not present

## 2018-05-06 NOTE — Progress Notes (Signed)
   Subjective:    Patient ID: Sabrina Adams, female    DOB: 1946-04-29, 72 y.o.   MRN: [id number]  Back Pain  This is a recurrent problem. The current episode started in the past 7 days. Pain location: low back hip area on right side. The pain is at a severity of 10/10. The symptoms are aggravated by twisting. She has tried heat for the symptoms.   Worse in the even Severe pain 10/10 Effects ADL No falls in past  Stays in the right lower pain lowr back Tramadol or dilaudid helps occas Using Dilaudid 3 times a day at times Pain medicine helps some but not enough Relates severe pain with rotation and bending  Patient had a CAT scan earlier this year which showed pulmonary nodules that needs a follow-up within 3 to 6 months this CAT scan was done in May 2019 so therefore it is now due patient is not a smoker her risk for lung cancer is low but it is recommended to follow this up based upon guidelines   Review of Systems  Musculoskeletal: Positive for back pain.  Patient denies any coughing difficulty breathing fever chills sweats vomiting hemoptysis     Objective:   Physical Exam Low back significant pain tenderness with subjective discomfort down the legs respiratory rate normal vital signs good 15 minutes was spent with patient today discussing healthcare issues which they came.  More than 50% of this visit-total duration of visit-was spent in counseling and coordination of care.  Please see diagnosis regarding the focus of this coordination and care        Assessment & Plan:  Severe lumbar pain recommend that the patient have x-rays of the lumbar spine she had an MRI back in May she will need spinal injections we will get this through neurosurgery Dilaudid may use 3-4 times per day when she needs a new prescription she will let us know Keep all regular follow-up visits  History of pulmonary nodules per protocols patient needs a follow-up CT scan of the chest without  contrast

## 2018-05-06 NOTE — Progress Notes (Signed)
Referral ordered in EPIC. 

## 2018-05-06 NOTE — Addendum Note (Signed)
Addended by: Dairl Ponder on: 05/06/2018 04:04 PM   Modules accepted: Orders

## 2018-05-06 NOTE — Telephone Encounter (Signed)
Results of stat xrays are available for review.

## 2018-05-07 NOTE — Telephone Encounter (Signed)
X-rays were reviewed see message for results

## 2018-05-08 ENCOUNTER — Other Ambulatory Visit: Payer: Self-pay | Admitting: *Deleted

## 2018-05-08 DIAGNOSIS — M129 Arthropathy, unspecified: Secondary | ICD-10-CM

## 2018-05-10 DIAGNOSIS — M47816 Spondylosis without myelopathy or radiculopathy, lumbar region: Secondary | ICD-10-CM | POA: Diagnosis not present

## 2018-05-20 ENCOUNTER — Ambulatory Visit (HOSPITAL_COMMUNITY)
Admission: RE | Admit: 2018-05-20 | Discharge: 2018-05-20 | Disposition: A | Discharge status: Home / Self Care | Payer: Medicare Other | Source: Ambulatory Visit | Attending: Family Medicine | Admitting: Family Medicine

## 2018-05-20 DIAGNOSIS — K449 Diaphragmatic hernia without obstruction or gangrene: Secondary | ICD-10-CM | POA: Diagnosis not present

## 2018-05-20 DIAGNOSIS — R918 Other nonspecific abnormal finding of lung field: Secondary | ICD-10-CM | POA: Diagnosis not present

## 2018-05-20 DIAGNOSIS — I700 Atherosclerosis of aorta: Secondary | ICD-10-CM | POA: Diagnosis not present

## 2018-05-29 ENCOUNTER — Ambulatory Visit (HOSPITAL_COMMUNITY)
Admission: RE | Admit: 2018-05-29 | Discharge: 2018-05-29 | Disposition: A | Discharge status: Home / Self Care | Payer: Medicare Other | Source: Ambulatory Visit | Attending: Family Medicine | Admitting: Family Medicine

## 2018-05-29 ENCOUNTER — Encounter (HOSPITAL_COMMUNITY): Payer: Self-pay

## 2018-05-29 DIAGNOSIS — Z1231 Encounter for screening mammogram for malignant neoplasm of breast: Secondary | ICD-10-CM

## 2018-06-05 DIAGNOSIS — I10 Essential (primary) hypertension: Secondary | ICD-10-CM | POA: Diagnosis not present

## 2018-06-05 DIAGNOSIS — E782 Mixed hyperlipidemia: Secondary | ICD-10-CM | POA: Diagnosis not present

## 2018-06-05 DIAGNOSIS — D696 Thrombocytopenia, unspecified: Secondary | ICD-10-CM | POA: Diagnosis not present

## 2018-06-05 DIAGNOSIS — E038 Other specified hypothyroidism: Secondary | ICD-10-CM | POA: Diagnosis not present

## 2018-06-06 LAB — CBC WITH DIFFERENTIAL/PLATELET
BASOS ABS: 0 10*3/uL (ref 0.0–0.2)
Basos: 1 %
EOS (ABSOLUTE): 0.1 10*3/uL (ref 0.0–0.4)
EOS: 2 %
HEMATOCRIT: 44.2 % (ref 34.0–46.6)
HEMOGLOBIN: 14.9 g/dL (ref 11.1–15.9)
Immature Grans (Abs): 0 10*3/uL (ref 0.0–0.1)
Immature Granulocytes: 0 %
LYMPHS ABS: 1.6 10*3/uL (ref 0.7–3.1)
Lymphs: 40 %
MCH: 30.3 pg (ref 26.6–33.0)
MCHC: 33.7 g/dL (ref 31.5–35.7)
MCV: 90 fL (ref 79–97)
MONOCYTES: 13 %
Monocytes Absolute: 0.5 10*3/uL (ref 0.1–0.9)
NEUTROS ABS: 1.9 10*3/uL (ref 1.4–7.0)
Neutrophils: 44 %
Platelets: 254 10*3/uL (ref 150–450)
RBC: 4.91 x10E6/uL (ref 3.77–5.28)
RDW: 12.8 % (ref 12.3–15.4)
WBC: 4.1 10*3/uL (ref 3.4–10.8)

## 2018-06-06 LAB — LIPID PANEL
Chol/HDL Ratio: 3.1 ratio (ref 0.0–4.4)
Cholesterol, Total: 221 mg/dL — ABNORMAL HIGH (ref 100–199)
HDL: 71 mg/dL (ref >39)
LDL CALC: 113 mg/dL — AB (ref 0–99)
Triglycerides: 185 mg/dL — ABNORMAL HIGH (ref 0–149)
VLDL Cholesterol Cal: 37 mg/dL (ref 5–40)

## 2018-06-06 LAB — BASIC METABOLIC PANEL
BUN / CREAT RATIO: 29 — AB (ref 12–28)
BUN: 21 mg/dL (ref 8–27)
CO2: 24 mmol/L (ref 20–29)
CREATININE: 0.73 mg/dL (ref 0.57–1.00)
Calcium: 9 mg/dL (ref 8.7–10.3)
Chloride: 103 mmol/L (ref 96–106)
GFR calc Af Amer: 96 mL/min/{1.73_m2} (ref >59)
GFR calc non Af Amer: 84 mL/min/{1.73_m2} (ref >59)
Glucose: 80 mg/dL (ref 65–99)
Potassium: 3.8 mmol/L (ref 3.5–5.2)
SODIUM: 140 mmol/L (ref 134–144)

## 2018-06-06 LAB — TSH: TSH: 0.954 u[IU]/mL (ref 0.450–4.500)

## 2018-06-13 ENCOUNTER — Encounter: Payer: Self-pay | Admitting: Family Medicine

## 2018-06-13 ENCOUNTER — Ambulatory Visit (INDEPENDENT_AMBULATORY_CARE_PROVIDER_SITE_OTHER): Payer: Medicare Other | Admitting: Family Medicine

## 2018-06-13 VITALS — BP 144/88 | Ht 65.0 in | Wt 165.0 lb

## 2018-06-13 DIAGNOSIS — E782 Mixed hyperlipidemia: Secondary | ICD-10-CM

## 2018-06-13 DIAGNOSIS — I10 Essential (primary) hypertension: Secondary | ICD-10-CM | POA: Diagnosis not present

## 2018-06-13 DIAGNOSIS — I700 Atherosclerosis of aorta: Secondary | ICD-10-CM | POA: Diagnosis not present

## 2018-06-13 DIAGNOSIS — G894 Chronic pain syndrome: Secondary | ICD-10-CM | POA: Diagnosis not present

## 2018-06-13 MED ORDER — HYDROMORPHONE HCL 2 MG PO TABS: ORAL_TABLET | ORAL | 0 refills | Status: DC

## 2018-06-13 MED ORDER — TRAMADOL HCL 50 MG PO TABS: 50.0000 mg | ORAL_TABLET | Freq: Four times a day (QID) | ORAL | 5 refills | Status: DC

## 2018-06-13 NOTE — Progress Notes (Signed)
Subjective:    Patient ID: Sabrina Adams, female    DOB: 1946-02-21, 72 y.o.   MRN: [id number]  HPI  This patient was seen today for chronic pain She has severe arthritis she also has severe back troubles she is tried an injection which really did not seem to help much The medication list was reviewed and updated.   -Compliance with medication: Yes  - Number patient states they take daily: Ultram she takes no more than 3 and the Dilaudid takes 0-1 per day  -when was the last dose patient took? Ultram this am and the Dilaudid yesterday afternoon  The patient was advised the importance of maintaining medication and not using illegal substances with these.  Here for refills and follow up No  The patient was educated that we can provide 3 monthly scripts for their medication, it is their responsibility to follow the instructions.  Side effects or complications from medications: Dry mouth and Dilaudid giving headaches, constipation  Patient is aware that pain medications are meant to minimize the severity of the pain to allow their pain levels to improve to allow for better function. They are aware of that pain medications cannot totally remove their pain.  Due for UDT ( at least once per year) : 03/2019  Patient is also here to discuss her chronic health issues. She has htn and takes Norvasc 5 mg daily.   Review of Systems  Constitutional: Negative for activity change, appetite change and fatigue.  HENT: Negative for congestion and rhinorrhea.   Respiratory: Negative for cough and shortness of breath.   Cardiovascular: Negative for chest pain and leg swelling.  Gastrointestinal: Negative for abdominal pain and diarrhea.  Endocrine: Negative for polydipsia and polyphagia.  Skin: Negative for color change.  Neurological: Negative for dizziness and weakness.  Psychiatric/Behavioral: Negative for behavioral problems and confusion.       Objective:   Physical Exam    Constitutional: She appears well-nourished. No distress.  HENT:  Head: Normocephalic and atraumatic.  Eyes: Right eye exhibits no discharge. Left eye exhibits no discharge.  Neck: No tracheal deviation present.  Cardiovascular: Normal rate, regular rhythm and normal heart sounds.  No murmur heard. Pulmonary/Chest: Effort normal and breath sounds normal. No respiratory distress.  Musculoskeletal: She exhibits no edema.  Lymphadenopathy:    She has no cervical adenopathy.  Neurological: She is alert. Coordination normal.  Skin: Skin is warm and dry.  Psychiatric: She has a normal mood and affect. Her behavior is normal.  Vitals reviewed.   I reviewed over her recent CAT scan which showed the pulmonary nodules went away he did show aortic atherosclerosis we are doing the best we can keep in her cholesterol under control and did show calcification of the aortic valve recently she does relate that she gets weak and short of breath with activity but admits she cannot do physical activity on a regular basis because of her severe arthritis it does raise into question though whether or not she is developing diastolic dysfunction CHF or aortic insufficiency/aortic stenosis therefore we will go ahead and order echo      Assessment & Plan:  Aortic calcification along with DOE I recommend the echo await the results will discuss with the patient once results return follow-up in several weeks  Blood pressure slightly elevated important for the patient to have this in good range I rechecked it several times even with her resting and yet it was still slightly elevated we will recheck  it again in several weeks if it is still elevated we will adjust her medicines  The patient was seen in followup for chronic pain. A review over at their current pain status was discussed. Drug registry was checked. Prescriptions were given. Discussion was held regarding the importance of compliance with medication as well  as pain medication contract.  Time for questions regarding pain management plan occurred. Importance of regular followup visits was discussed. Patient was informed that medication may cause drowsiness and should not be combined  with other medications/alcohol or street drugs. Patient was cautioned that medication could cause drowsiness. If the patient feels medication is causing altered alertness then do not drive or operate dangerous equipment.  Patient with significant chronic low back pain recent injection has not helped much has to use Dilaudid once or twice a day new prescription sent in otherwise uses tramadol multiple times a day  25 minutes was spent with the patient.  This statement verifies that 25 minutes was indeed spent with the patient.  More than 50% of this visit-total duration of the visit-was spent in counseling and coordination of care. The issues that the patient came in for today as reflected in the diagnosis (s) please refer to documentation for further details.

## 2018-06-19 ENCOUNTER — Ambulatory Visit (HOSPITAL_COMMUNITY)
Admission: RE | Admit: 2018-06-19 | Discharge: 2018-06-19 | Disposition: A | Discharge status: Home / Self Care | Payer: Medicare Other | Source: Ambulatory Visit | Attending: Family Medicine | Admitting: Family Medicine

## 2018-06-19 DIAGNOSIS — I700 Atherosclerosis of aorta: Secondary | ICD-10-CM | POA: Diagnosis not present

## 2018-06-19 NOTE — Progress Notes (Signed)
*  PRELIMINARY RESULTS* Echocardiogram 2D Echocardiogram has been performed.  Sabrina Adams 06/19/2018, 2:45 PM

## 2018-06-21 ENCOUNTER — Telehealth: Payer: Self-pay

## 2018-06-24 ENCOUNTER — Other Ambulatory Visit: Payer: Self-pay | Admitting: Family Medicine

## 2018-06-27 ENCOUNTER — Other Ambulatory Visit: Payer: Self-pay

## 2018-07-09 ENCOUNTER — Ambulatory Visit (INDEPENDENT_AMBULATORY_CARE_PROVIDER_SITE_OTHER): Payer: Medicare Other | Admitting: Family Medicine

## 2018-07-09 VITALS — BP 130/72 | Wt 163.0 lb

## 2018-07-09 DIAGNOSIS — I10 Essential (primary) hypertension: Secondary | ICD-10-CM | POA: Diagnosis not present

## 2018-07-09 DIAGNOSIS — M545 Low back pain, unspecified: Secondary | ICD-10-CM

## 2018-07-09 DIAGNOSIS — G894 Chronic pain syndrome: Secondary | ICD-10-CM | POA: Diagnosis not present

## 2018-07-09 DIAGNOSIS — Z79891 Long term (current) use of opiate analgesic: Secondary | ICD-10-CM

## 2018-07-09 DIAGNOSIS — Z23 Encounter for immunization: Secondary | ICD-10-CM | POA: Diagnosis not present

## 2018-07-09 DIAGNOSIS — Z0000 Encounter for general adult medical examination without abnormal findings: Secondary | ICD-10-CM | POA: Diagnosis not present

## 2018-07-09 MED ORDER — HYDROMORPHONE HCL 2 MG PO TABS: ORAL_TABLET | ORAL | 0 refills | Status: DC

## 2018-07-09 NOTE — Progress Notes (Signed)
Subjective:    Patient ID: Sabrina Adams, female    DOB: 1946-01-26, 72 y.o.   MRN: [id number]  Hypertension  This is a chronic problem. Pertinent negatives include no chest pain or shortness of breath. Compliance problems: takes meds every day, eats healty.    Needs refill on tramadol.  We did discuss wellness Patient doing a good job with diet Unable to do much exercise because of her rheumatoid arthritis She is currently on prednisone Her rheumatoid doctor follows her bone density testing  Wants to get flu vaccine.   AWV- Annual Wellness Visit  The patient was seen for their annual wellness visit. The patient's past medical history, surgical history, and family history were reviewed. Pertinent vaccines were reviewed ( tetanus, pneumonia, shingles, flu) The patient's medication list was reviewed and updated.  The height and weight were entered.  BMI recorded in electronic record elsewhere  Cognitive screening was completed. Outcome of Mini - Cog: pass   Falls /depression screening electronically recorded within record elsewhere  Current tobacco usage: none (All patients who use tobacco were given written and verbal information on quitting)  Recent listing of emergency department/hospitalizations over the past year were reviewed.  current specialist the patient sees on a regular basis:    Medicare annual wellness visit patient questionnaire was reviewed.  A written screening schedule for the patient for the next 5-10 years was given. Appropriate discussion of followup regarding next visit was discussed.  This patient was seen today for chronic pain  The medication list was reviewed and updated.   -Compliance with medication: Takes medication as necessary for her pain  - Number patient states they take daily: Takes 3 daily when the pain is bad  -when was the last dose patient took?  Earlier today  The patient was advised the importance of maintaining  medication and not using illegal substances with these.  Here for refills and follow up  The patient was educated that we can provide 3 monthly scripts for their medication, it is their responsibility to follow the instructions.  Side effects or complications from medications: Denies side effects or nausea  Patient is aware that pain medications are meant to minimize the severity of the pain to allow their pain levels to improve to allow for better function. They are aware of that pain medications cannot totally remove their pain.  Due for UDT ( at least once per year) : Takes medication on a regular basis gets a yearly drug screen  Drug registry was reviewed         Review of Systems  Constitutional: Negative for activity change, appetite change and fatigue.  HENT: Negative for congestion and rhinorrhea.   Respiratory: Negative for cough and shortness of breath.   Cardiovascular: Negative for chest pain and leg swelling.  Gastrointestinal: Negative for abdominal pain and diarrhea.  Endocrine: Negative for polydipsia and polyphagia.  Musculoskeletal: Positive for arthralgias, back pain and gait problem.  Skin: Negative for color change.  Neurological: Negative for dizziness and weakness.  Psychiatric/Behavioral: Negative for behavioral problems and confusion.       Objective:   Physical Exam  Constitutional: She appears well-nourished. No distress.  HENT:  Head: Normocephalic and atraumatic.  Eyes: Right eye exhibits no discharge. Left eye exhibits no discharge.  Neck: No tracheal deviation present.  Cardiovascular: Normal rate, regular rhythm and normal heart sounds.  No murmur heard. Pulmonary/Chest: Effort normal and breath sounds normal. No respiratory distress.  Musculoskeletal: She exhibits no  edema.  Lymphadenopathy:    She has no cervical adenopathy.  Neurological: She is alert. Coordination normal.  Skin: Skin is warm and dry.  Psychiatric: She has a normal  mood and affect. Her behavior is normal.  Vitals reviewed.         Assessment & Plan:  Adult wellness-complete.wellness physical was conducted today. Importance of diet and exercise were discussed in detail.  In addition to this a discussion regarding safety was also covered. We also reviewed over immunizations and gave recommendations regarding current immunization needed for age.  In addition to this additional areas were also touched on including: Preventative health exams needed:  Colonoscopy up-to-date next one is 2027  Patient was advised yearly wellness exam Flu shot today  Shin Grix prescription recommended  The patient was seen in followup for chronic pain. A review over at their current pain status was discussed. Drug registry was checked. Prescriptions were given. Discussion was held regarding the importance of compliance with medication as well as pain medication contract.  Time for questions regarding pain management plan occurred. Importance of regular followup visits was discussed. Patient was informed that medication may cause drowsiness and should not be combined  with other medications/alcohol or street drugs. Patient was cautioned that medication could cause drowsiness. If the patient feels medication is causing altered alertness then do not drive or operate dangerous equipment.

## 2018-08-15 DIAGNOSIS — H16223 Keratoconjunctivitis sicca, not specified as Sjogren's, bilateral: Secondary | ICD-10-CM | POA: Diagnosis not present

## 2018-08-15 DIAGNOSIS — H401121 Primary open-angle glaucoma, left eye, mild stage: Secondary | ICD-10-CM | POA: Diagnosis not present

## 2018-10-03 DIAGNOSIS — M1990 Unspecified osteoarthritis, unspecified site: Secondary | ICD-10-CM | POA: Diagnosis not present

## 2018-10-03 DIAGNOSIS — M25439 Effusion, unspecified wrist: Secondary | ICD-10-CM | POA: Diagnosis not present

## 2018-10-03 DIAGNOSIS — M549 Dorsalgia, unspecified: Secondary | ICD-10-CM | POA: Diagnosis not present

## 2018-10-03 DIAGNOSIS — M0579 Rheumatoid arthritis with rheumatoid factor of multiple sites without organ or systems involvement: Secondary | ICD-10-CM | POA: Diagnosis not present

## 2018-10-03 DIAGNOSIS — Z7952 Long term (current) use of systemic steroids: Secondary | ICD-10-CM | POA: Diagnosis not present

## 2018-10-03 DIAGNOSIS — M810 Age-related osteoporosis without current pathological fracture: Secondary | ICD-10-CM | POA: Diagnosis not present

## 2018-10-03 DIAGNOSIS — M79643 Pain in unspecified hand: Secondary | ICD-10-CM | POA: Diagnosis not present

## 2018-10-09 ENCOUNTER — Ambulatory Visit (INDEPENDENT_AMBULATORY_CARE_PROVIDER_SITE_OTHER): Payer: Medicare Other | Admitting: Family Medicine

## 2018-10-09 ENCOUNTER — Encounter: Payer: Self-pay | Admitting: Family Medicine

## 2018-10-09 VITALS — BP 128/86 | Ht 65.0 in | Wt 166.0 lb

## 2018-10-09 DIAGNOSIS — G894 Chronic pain syndrome: Secondary | ICD-10-CM

## 2018-10-09 MED ORDER — LEVOTHYROXINE SODIUM 112 MCG PO TABS: 112.0000 ug | ORAL_TABLET | Freq: Every day | ORAL | 1 refills | Status: DC

## 2018-10-09 MED ORDER — PANTOPRAZOLE SODIUM 40 MG PO TBEC: 40.0000 mg | DELAYED_RELEASE_TABLET | Freq: Every day | ORAL | 1 refills | Status: DC

## 2018-10-09 MED ORDER — TRAMADOL HCL 50 MG PO TABS: 50.0000 mg | ORAL_TABLET | Freq: Four times a day (QID) | ORAL | 5 refills | Status: DC

## 2018-10-09 NOTE — Progress Notes (Signed)
Subjective:    Patient ID: Sabrina Adams, female    DOB: [birth year], 73 y.o.   MRN: [id number]  HPI  Patient is here today to follow up on her Chronic health issues and her pain management.  Hypertension: On Amlodipine 5 mg once per day Patient for blood pressure check up.  The patient does have hypertension.  The patient is on medication.  Patient relates compliance with meds. Todays BP reviewed with the patient. Patient denies issues with medication. Patient relates reasonable diet. Patient tries to minimize salt. Patient aware of BP goals.  Hypothyroidism:Levothyroxine 112 mcg once per day, needs refills Patient has thyroid condition.  Takes thyroid medication on a regular basis.  States that the proper way.  Relates compliance.  States no negative side effects.  States condition seems to be under good control.  Gerd:Protonix 40 mg once per day, Needs refills.  This patient was seen today for chronic pain  The medication list was reviewed and updated.   -Compliance with medication: Tramadol 50 mg QID,Hydromorphone 2 mg none up to three per day  - Number patient states they take daily: 0-3 per day  -when was the last dose patient took? A month ago  The patient was advised the importance of maintaining medication and not using illegal substances with these.  Here for refills and follow up  The patient was educated that we can provide 3 monthly scripts for their medication, it is their responsibility to follow the instructions.  Side effects or complications from medications:Sleepy, nauseated takes phenergan with when she takes it.  Patient is aware that pain medications are meant to minimize the severity of the pain to allow their pain levels to improve to allow for better function. They are aware of that pain medications cannot totally remove their pain.  Due for UDT ( at least once per year) : 03/14/2019       Review of Systems  Constitutional: Negative for  activity change, appetite change and fatigue.  HENT: Negative for congestion and rhinorrhea.   Respiratory: Negative for cough and shortness of breath.   Cardiovascular: Negative for chest pain and leg swelling.  Gastrointestinal: Negative for abdominal pain and diarrhea.  Endocrine: Negative for polydipsia and polyphagia.  Skin: Negative for color change.  Neurological: Negative for dizziness and weakness.  Psychiatric/Behavioral: Negative for behavioral problems and confusion.       Objective:   Physical Exam Vitals signs reviewed.  Constitutional:      General: She is not in acute distress. HENT:     Head: Normocephalic and atraumatic.  Eyes:     General:        Right eye: No discharge.        Left eye: No discharge.  Neck:     Trachea: No tracheal deviation.  Cardiovascular:     Rate and Rhythm: Normal rate and regular rhythm.     Heart sounds: Normal heart sounds. No murmur.  Pulmonary:     Effort: Pulmonary effort is normal. No respiratory distress.     Breath sounds: Normal breath sounds.  Lymphadenopathy:     Cervical: No cervical adenopathy.  Skin:    General: Skin is warm and dry.  Neurological:     Mental Status: She is alert.     Coordination: Coordination normal.  Psychiatric:        Behavior: Behavior normal.           Assessment & Plan:  Severe rheumatoid arthritis She is  on Plaquenil She had serious infection with biologic agent so therefore she does not want to be on those   Chronic pain using tramadol 3-4 times per day using Dilaudid only occasionally refills given  Patient to follow-up in 3 to 4 months lab work at that visit

## 2018-10-24 ENCOUNTER — Encounter (INDEPENDENT_AMBULATORY_CARE_PROVIDER_SITE_OTHER): Payer: Self-pay

## 2018-10-29 ENCOUNTER — Telehealth (INDEPENDENT_AMBULATORY_CARE_PROVIDER_SITE_OTHER): Payer: Medicare Other | Admitting: Internal Medicine

## 2018-10-29 ENCOUNTER — Other Ambulatory Visit: Payer: Self-pay

## 2018-10-29 DIAGNOSIS — K5900 Constipation, unspecified: Secondary | ICD-10-CM

## 2018-10-29 DIAGNOSIS — K219 Gastro-esophageal reflux disease without esophagitis: Secondary | ICD-10-CM | POA: Diagnosis not present

## 2018-10-29 MED ORDER — PANTOPRAZOLE SODIUM 40 MG PO TBEC: 40.0000 mg | DELAYED_RELEASE_TABLET | Freq: Every day | ORAL | 3 refills | Status: DC

## 2018-10-29 NOTE — Progress Notes (Addendum)
Virtual Visit via Telephone Note  I connected with Sabrina Adams on 10/29/18 at  2:00 PM EDT by telephone and verified that I am speaking with the correct person using two identifiers.   I discussed the limitations, risks, security and privacy concerns of performing an evaluation and management service by telephone and the availability of in person appointments. I also discussed with the patient that there may be a patient responsible charge related to this service. The patient expressed understanding and agreed to proceed with telephone visit. She is unable to do video visit. Patient requested telephone visit be arranged due to COVID-19. Sabrina Adams is at home and I am in the office. Thomas Hoff, LPN was also present for telephone visit.   History of Present Illness:  Patient is 73 year old Caucasian female who has history of GERD and chronic constipation who was last seen on 04/29/2018. Patient reports doing much better.  She has been watching her diet and she has lost 16 pounds since she was last seen in the office.  She is watching her diet and taking stool softener every day with good results.  She has not used polyethylene glycol in the last several weeks.  Similarly she reports satisfactory control of her heartburn as long as she takes her medication.  If she skips a dose she has heartburn and burning in her epigastrium by the evening.  She is not having any side effects with medication. She denies melena or rectal bleeding nausea vomiting or dysphagia. She is on pain medication for chronic ankle pain. She request new prescription for pantoprazole be sent to her mail order pharmacy.     Observations/Objective:  Patient reports her weight to be 159 pounds from this morning.  Assessment and Plan:  #1. Chronic GERD.  She is doing well with therapy.  She will continue PPI at current dose until her next visit in 1 year.  New prescription for pantoprazole 40 mg p.o. daily sent to her  pharmacy for 90 with 3 refills.  #2.  Chronic constipation.  She is doing well with dietary measures and stool softener and not having to use polyethylene glycol often.  #3.  Weight loss appears to be voluntary and therefore of no concern.   Follow Up Instructions:  Office visit in 1 year.   I discussed the assessment and treatment plan with the patient. The patient was provided an opportunity to ask questions and all were answered. The patient agreed with the plan and demonstrated an understanding of the instructions.   The patient was advised to call back or seek an in-person evaluation if the symptoms worsen or if the condition fails to improve as anticipated.  I provided 10 minutes of non-face-to-face time during this encounter.   Hildred Laser, MD

## 2018-11-30 ENCOUNTER — Encounter: Payer: Self-pay | Admitting: Family Medicine

## 2018-12-02 ENCOUNTER — Other Ambulatory Visit: Payer: Self-pay

## 2018-12-02 MED ORDER — GABAPENTIN 300 MG PO CAPS: 300.0000 mg | ORAL_CAPSULE | Freq: Two times a day (BID) | ORAL | 3 refills | Status: DC

## 2018-12-02 NOTE — Telephone Encounter (Signed)
May have 90 day with 3 rf

## 2018-12-03 ENCOUNTER — Other Ambulatory Visit: Payer: Self-pay

## 2018-12-03 MED ORDER — GABAPENTIN 300 MG PO CAPS: 300.0000 mg | ORAL_CAPSULE | Freq: Two times a day (BID) | ORAL | 1 refills | Status: DC

## 2019-02-12 ENCOUNTER — Ambulatory Visit (INDEPENDENT_AMBULATORY_CARE_PROVIDER_SITE_OTHER): Payer: Medicare Other | Admitting: Family Medicine

## 2019-02-12 ENCOUNTER — Other Ambulatory Visit: Payer: Self-pay

## 2019-02-12 DIAGNOSIS — I10 Essential (primary) hypertension: Secondary | ICD-10-CM | POA: Diagnosis not present

## 2019-02-12 DIAGNOSIS — E782 Mixed hyperlipidemia: Secondary | ICD-10-CM

## 2019-02-12 DIAGNOSIS — G894 Chronic pain syndrome: Secondary | ICD-10-CM | POA: Diagnosis not present

## 2019-02-12 DIAGNOSIS — E038 Other specified hypothyroidism: Secondary | ICD-10-CM

## 2019-02-12 MED ORDER — AMLODIPINE BESYLATE 2.5 MG PO TABS: 2.5000 mg | ORAL_TABLET | Freq: Every day | ORAL | 3 refills | Status: DC

## 2019-02-12 MED ORDER — HYDROMORPHONE HCL 2 MG PO TABS: ORAL_TABLET | ORAL | 0 refills | Status: DC

## 2019-02-12 MED ORDER — TRAMADOL HCL 50 MG PO TABS: 50.0000 mg | ORAL_TABLET | Freq: Four times a day (QID) | ORAL | 5 refills | Status: DC

## 2019-02-12 NOTE — Progress Notes (Signed)
   Subjective:    Patient ID: Sabrina Adams, female    DOB: 05/30/1946, 73 y.o.   MRN: [id number]  HPI Pt here for 4 month follow up. Pt states she is taking Tramadol 50 mg 2-3 tablets daily.Take Dilaudid 2 mg prn. Pt states she is doing well.   1 Patient does have chronic pain and discomfort in her back as well as rheumatoid arthritis she takes her Dilaudid medication intermittently she uses tramadol on a regular basis denies abusing it it does help her function Pt states she has been having some burning/ numbness in feet; going on for about a month. Also having some occasional numbness at fingertips.   Pt has bad arthritis at base of thumb.   Virtual Visit via Video Note  I connected with Sabrina Adams on 02/12/19 at  2:00 PM EDT by a video enabled telemedicine application and verified that I am speaking with the correct person using two identifiers.  Location: Patient: home Provider: office   I discussed the limitations of evaluation and management by telemedicine and the availability of in person appointments. The patient expressed understanding and agreed to proceed.  History of Present Illness:    Observations/Objective:   Assessment and Plan:   Follow Up Instructions:    I discussed the assessment and treatment plan with the patient. The patient was provided an opportunity to ask questions and all were answered. The patient agreed with the plan and demonstrated an understanding of the instructions.   The patient was advised to call back or seek an in-person evaluation if the symptoms worsen or if the condition fails to improve as anticipated.  I provided 15 minutes of non-face-to-face time during this encounter.   Vicente Males, LPN    Review of Systems  Constitutional: Negative for activity change and appetite change.  HENT: Negative for congestion and rhinorrhea.   Respiratory: Negative for cough and shortness of breath.   Cardiovascular: Negative  for chest pain and leg swelling.  Gastrointestinal: Negative for abdominal pain, nausea and vomiting.  Musculoskeletal: Positive for arthralgias and back pain.  Skin: Negative for color change.  Neurological: Negative for dizziness and weakness.  Psychiatric/Behavioral: Negative for agitation and confusion.       Objective:   Physical Exam   Today's visit was via telephone Physical exam was not possible for this visit      Assessment & Plan:  Chronic low back pain uses Dilaudid sparingly a new prescription was sent in she also uses tramadol 3 times daily occasional 4 times daily denies abusing it drug registry checked it does help her function  Rheumatoid arthritis follows up with her specialist on this on a regular basis  Peripheral neuropathy unlikely due to diabetes her recent lab work in October showed sugar was normal I offered B12 and neurology testing patient defers currently  She will let us know if it gets worse  Blood pressure decent control she states she is lost weight does not need a stronger medicine she is reduced at 2.5 mg she will continue that dosing

## 2019-04-03 DIAGNOSIS — Z7952 Long term (current) use of systemic steroids: Secondary | ICD-10-CM | POA: Diagnosis not present

## 2019-04-03 DIAGNOSIS — M25439 Effusion, unspecified wrist: Secondary | ICD-10-CM | POA: Diagnosis not present

## 2019-04-03 DIAGNOSIS — M79643 Pain in unspecified hand: Secondary | ICD-10-CM | POA: Diagnosis not present

## 2019-04-03 DIAGNOSIS — M0579 Rheumatoid arthritis with rheumatoid factor of multiple sites without organ or systems involvement: Secondary | ICD-10-CM | POA: Diagnosis not present

## 2019-04-03 DIAGNOSIS — M1990 Unspecified osteoarthritis, unspecified site: Secondary | ICD-10-CM | POA: Diagnosis not present

## 2019-04-03 DIAGNOSIS — M810 Age-related osteoporosis without current pathological fracture: Secondary | ICD-10-CM | POA: Diagnosis not present

## 2019-04-03 DIAGNOSIS — M549 Dorsalgia, unspecified: Secondary | ICD-10-CM | POA: Diagnosis not present

## 2019-04-07 DIAGNOSIS — Z961 Presence of intraocular lens: Secondary | ICD-10-CM | POA: Diagnosis not present

## 2019-04-07 DIAGNOSIS — H401121 Primary open-angle glaucoma, left eye, mild stage: Secondary | ICD-10-CM | POA: Diagnosis not present

## 2019-04-07 DIAGNOSIS — H16223 Keratoconjunctivitis sicca, not specified as Sjogren's, bilateral: Secondary | ICD-10-CM | POA: Diagnosis not present

## 2019-04-22 DIAGNOSIS — M810 Age-related osteoporosis without current pathological fracture: Secondary | ICD-10-CM | POA: Diagnosis not present

## 2019-04-24 IMAGING — CT CT CHEST W/ CM
2 of 5 series · 12 of 36 positions shown, 15 images · IV contrast (Isovue)
Comparison: Chest radiograph, 12/20/2017.

CLINICAL DATA: *Malaise - Pt presented with symptoms of low grade
fever, malaise, nausea in setting of recent tick exposure. She does
not recall a rash. She is being empirically treated with doxycycline
for possible RMSF. Lab tests are pending.
*FUO - obtain CT chest / abd / pelvis as part of work up for FUO. Pt
is immunocompromised.
*Sepsis - lactate trending down with hydration. Broaden antibiotic
coverage pending culture and work up.
*Leukopenia/thrombocytopenia - Holding plaquenil for now. Follow
CBC.
*Elevated Transaminases

EXAM:
CT CHEST, ABDOMEN, AND PELVIS WITH CONTRAST
TECHNIQUE: Multidetector CT imaging of the chest, abdomen and pelvis was
performed following the standard protocol during bolus
administration of intravenous contrast.
CONTRAST:  100mL [id number] IOPAMIDOL ([id number]) INJECTION 61%

[Series 2: cap with · axial · 0.80mm/px · z∈[-634,-124]mm · 9 of 126 slices shown, 12 images]
[im 12/126  mediastinal]
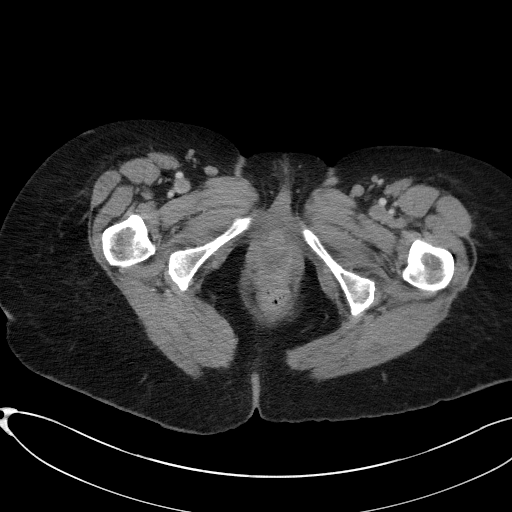
[im 12/126  lung]
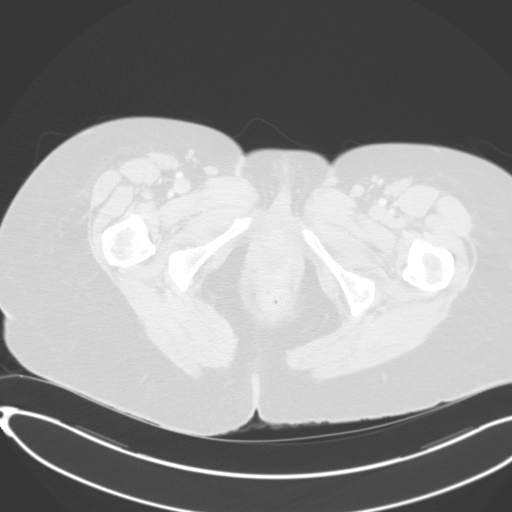
[im 23/126  lung]
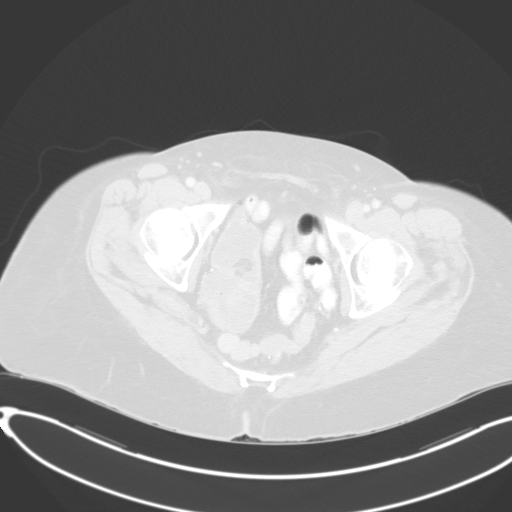
[im 35/126  lung]
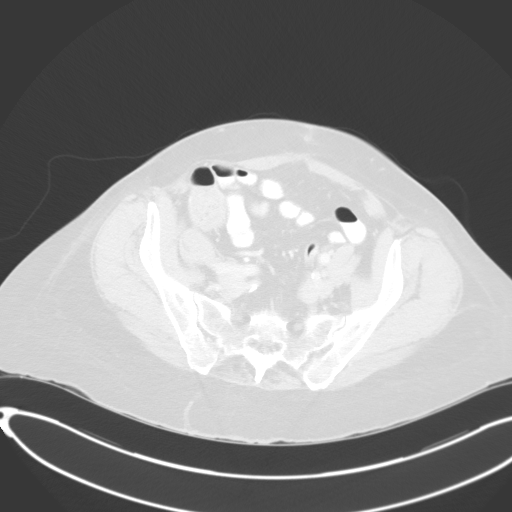
[im 46/126  lung]
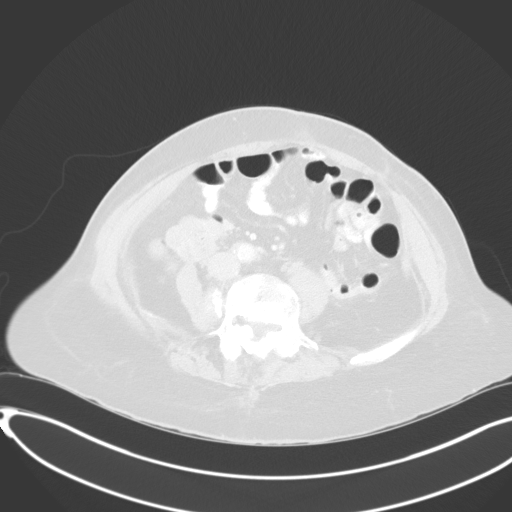
[im 69/126  mediastinal]
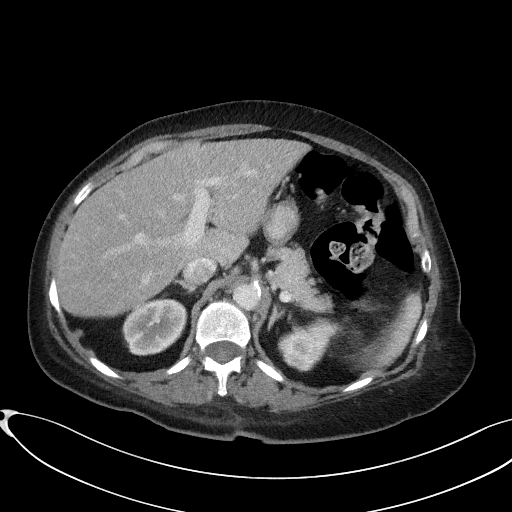
[im 69/126  lung]
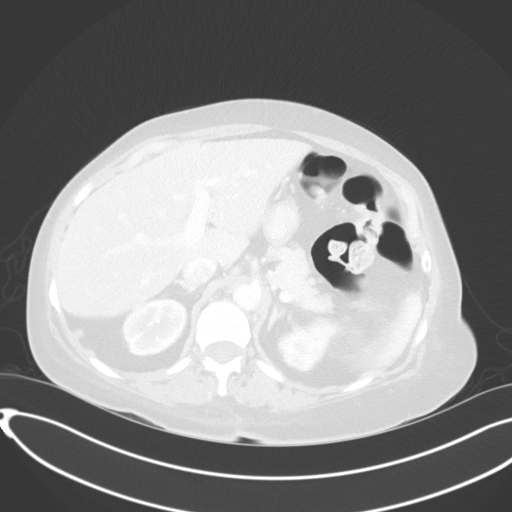
[im 80/126  lung]
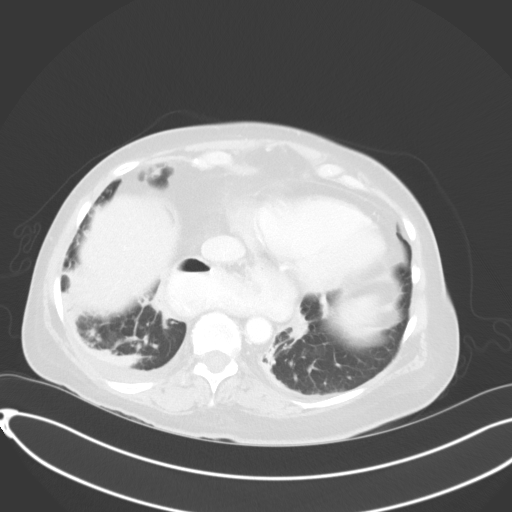
[im 91/126  lung]
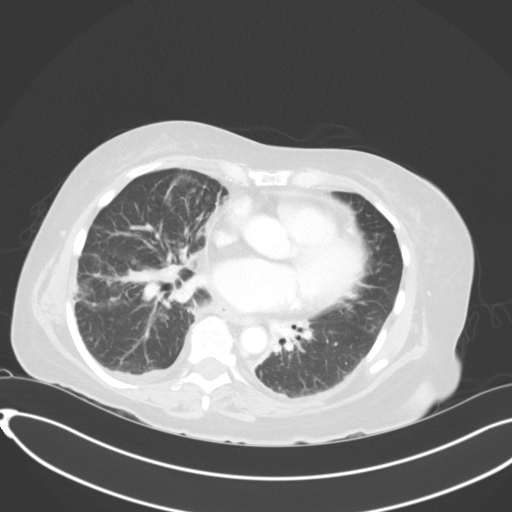
[im 103/126  lung]
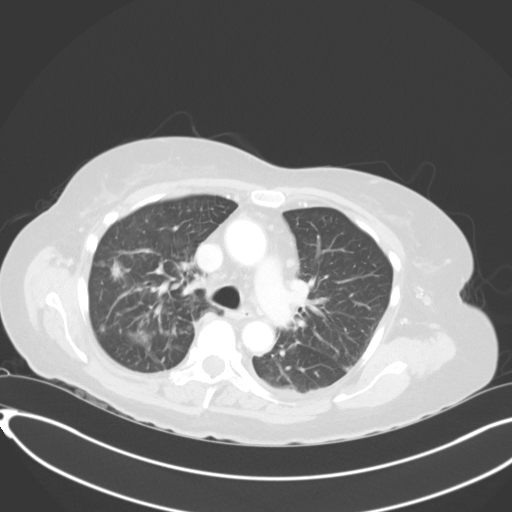
[im 114/126  mediastinal]
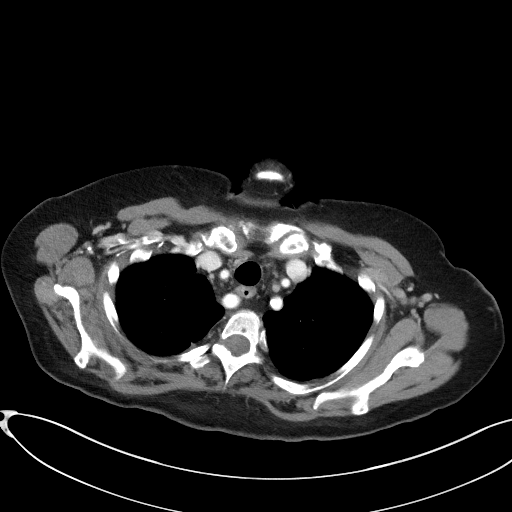
[im 114/126  lung]
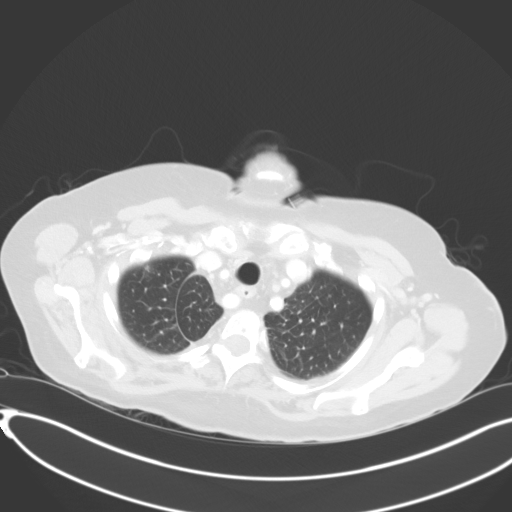

[Series 4: coronals · coronal · 0.82mm/px · 3 of 130 slices shown]
[im 26/130  lung]
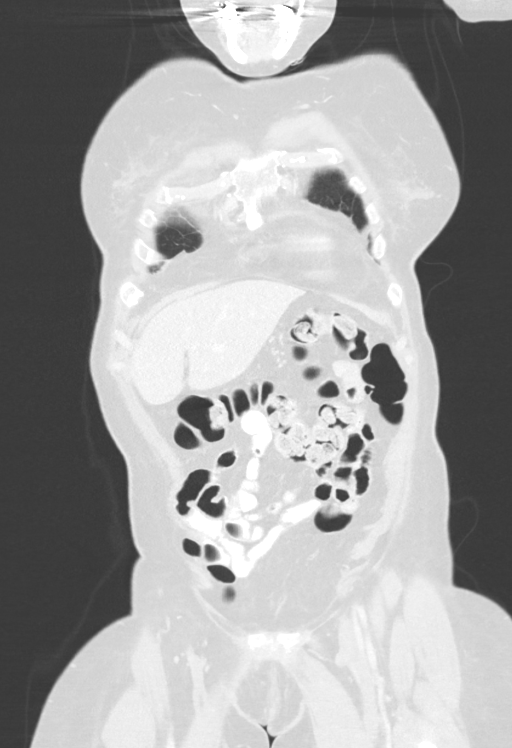
[im 52/130  lung]
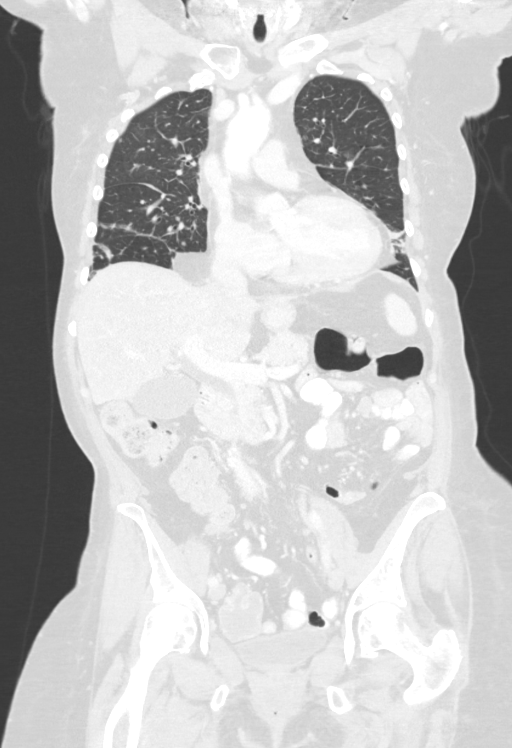
[im 78/130  lung]
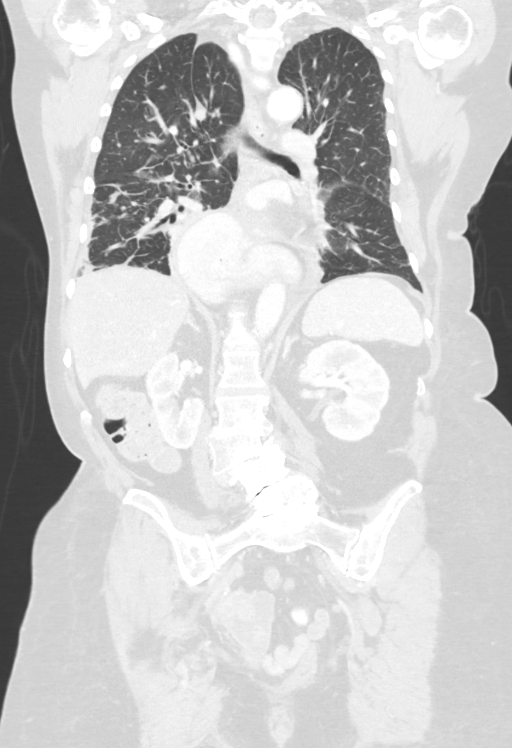

[12 of 36 positions shown; findings below may reference images not displayed]

FINDINGS: CT CHEST FINDINGS

Cardiovascular: Heart is normal in size. No pericardial effusion. No
coronary artery calcifications. Great vessels normal caliber. Mild
aortic atherosclerosis.

Mediastinum/Nodes: Moderate size hiatal hernia. No mediastinal or
hilar masses. No discrete pathologically enlarged lymph nodes.
Trachea is widely patent. No esophageal distension. No neck base or
axillary masses or adenopathy.

Lungs and pleura: Trace pleural effusions. There is bilateral
interstitial thickening that is greater on the right. There are
multiple right-sided pulmonary nodules. Largest is in the right
upper lobe, centered on image 60, with ill-defined margins,
measuring 16 x 8 mm transversely, mean 11 mm. All remaining nodules
are subcentimeter. There is additional opacity lung bases, right
greater than left, consistent with atelectasis.

No pneumothorax.

Musculoskeletal: No fracture or acute finding. No osteoblastic or
osteolytic lesions.

CT ABDOMEN PELVIS FINDINGS

Hepatobiliary: Liver is normal in size. There is decreased
attenuation of the liver consistent with fatty infiltration. No
liver mass or focal lesion. Small dependent gallstone. Gallbladder
is otherwise unremarkable. Common bile duct is prominent measuring 8
mm, demonstrating distal tapering.

Pancreas: Unremarkable. No pancreatic ductal dilatation or
surrounding inflammatory changes.

Spleen: Normal in size without focal abnormality.

Adrenals/Urinary Tract: No adrenal masses.

Bilateral renal cortical thinning. There are small bilateral
low-density renal masses consistent with cysts. No stones. No
hydronephrosis. Ureters are normal in course and in caliber. Bladder
is unremarkable.

Stomach/Bowel: Stomach unremarkable other than the hiatal hernia.
Small bowel is normal in caliber. No wall thickening or
inflammation. Bowel anastomosis staple line lies along the lower
sigmoid colon just above the rectum. No colonic wall thickening or
inflammation. No evidence of obstruction. Appendix not discretely
seen. No findings of appendicitis.

Vascular/Lymphatic: Aortic atherosclerosis. No enlarged abdominal or
pelvic lymph nodes.

Reproductive: Status post hysterectomy. No adnexal masses.

Other: No abdominal wall hernia or abnormality. No abdominopelvic
ascites.

Musculoskeletal: No fracture or acute finding. Levoscoliosis of the
lumbar spine. Degenerative changes noted of the lumbar spine. No
osteoblastic or osteolytic lesions.
IMPRESSION: 1. Lungs demonstrate multiple small nodules on the right, largest
measuring a mean of 11 mm. Suspect that these are
infectious/inflammatory in etiology given the history. Non-contrast
chest CT at 3-6 months is recommended. If the nodules are stable at
time of repeat CT, then future CT at 18-24 months (from today's
scan) is considered optional for low-risk patients, but is
recommended for high-risk patients. This recommendation follows the
consensus statement: Guidelines for Management of Incidental
Pulmonary Nodules Detected on CT Images: From the [HOSPITAL]
2. Lungs show a right greater than left interstitial thickening with
minimal pleural effusions. Findings are consistent with mild
asymmetric interstitial pulmonary edema.
3. Dependent opacity at the lung bases, right greater than left,
most consistent with atelectasis. No convincing pneumonia.
4. No acute findings within the abdomen or pelvis.
5. Hepatic steatosis.  Status post hysterectomy.
6. Aortic atherosclerosis.

## 2019-04-29 ENCOUNTER — Other Ambulatory Visit: Payer: Self-pay | Admitting: *Deleted

## 2019-04-29 MED ORDER — LEVOTHYROXINE SODIUM 112 MCG PO TABS: 112.0000 ug | ORAL_TABLET | Freq: Every day | ORAL | 1 refills | Status: DC

## 2019-05-13 ENCOUNTER — Other Ambulatory Visit (HOSPITAL_COMMUNITY): Payer: Self-pay | Admitting: Family Medicine

## 2019-05-13 ENCOUNTER — Ambulatory Visit (HOSPITAL_COMMUNITY): Payer: Medicare Other

## 2019-05-13 DIAGNOSIS — Z1231 Encounter for screening mammogram for malignant neoplasm of breast: Secondary | ICD-10-CM

## 2019-06-02 ENCOUNTER — Other Ambulatory Visit: Payer: Self-pay

## 2019-06-02 ENCOUNTER — Ambulatory Visit (HOSPITAL_COMMUNITY)
Admission: RE | Admit: 2019-06-02 | Discharge: 2019-06-02 | Disposition: A | Discharge status: Home / Self Care | Payer: Medicare Other | Source: Ambulatory Visit | Attending: Family Medicine | Admitting: Family Medicine

## 2019-06-02 DIAGNOSIS — Z1231 Encounter for screening mammogram for malignant neoplasm of breast: Secondary | ICD-10-CM | POA: Diagnosis not present

## 2019-06-19 ENCOUNTER — Other Ambulatory Visit: Payer: Self-pay | Admitting: *Deleted

## 2019-06-19 DIAGNOSIS — I700 Atherosclerosis of aorta: Secondary | ICD-10-CM

## 2019-06-19 NOTE — Progress Notes (Signed)
ec

## 2019-06-27 ENCOUNTER — Ambulatory Visit (HOSPITAL_COMMUNITY)
Admission: RE | Admit: 2019-06-27 | Discharge: 2019-06-27 | Disposition: A | Discharge status: Home / Self Care | Payer: Medicare Other | Source: Ambulatory Visit | Attending: Family Medicine | Admitting: Family Medicine

## 2019-06-27 ENCOUNTER — Other Ambulatory Visit: Payer: Self-pay

## 2019-06-27 DIAGNOSIS — I700 Atherosclerosis of aorta: Secondary | ICD-10-CM | POA: Insufficient documentation

## 2019-06-27 NOTE — Progress Notes (Signed)
*  PRELIMINARY RESULTS* Echocardiogram 2D Echocardiogram has been performed.  Sabrina Adams 06/27/2019, 10:09 AM

## 2019-07-04 ENCOUNTER — Telehealth: Payer: Self-pay | Admitting: Family Medicine

## 2019-07-04 MED ORDER — NITROFURANTOIN MONOHYD MACRO 100 MG PO CAPS: ORAL_CAPSULE | ORAL | 0 refills | Status: DC

## 2019-07-04 NOTE — Telephone Encounter (Signed)
Pt thinks she has a UTI, her symptoms are frequency, burning and like she is not emptying her bladder.  [place], [place]

## 2019-07-04 NOTE — Telephone Encounter (Signed)
Medication sent in and pt is aware  

## 2019-07-04 NOTE — Telephone Encounter (Signed)
Please advise. Thank you

## 2019-07-04 NOTE — Telephone Encounter (Signed)
macrobid 100 bid five d 

## 2019-08-07 ENCOUNTER — Other Ambulatory Visit: Payer: Self-pay

## 2019-08-07 MED ORDER — GABAPENTIN 300 MG PO CAPS: 300.0000 mg | ORAL_CAPSULE | Freq: Two times a day (BID) | ORAL | 1 refills | Status: DC

## 2019-08-16 ENCOUNTER — Other Ambulatory Visit: Payer: Self-pay | Admitting: Family Medicine

## 2019-08-19 ENCOUNTER — Other Ambulatory Visit: Payer: Self-pay | Admitting: Family Medicine

## 2019-09-18 DIAGNOSIS — Z23 Encounter for immunization: Secondary | ICD-10-CM | POA: Diagnosis not present

## 2019-10-01 ENCOUNTER — Other Ambulatory Visit: Payer: Self-pay | Admitting: Family Medicine

## 2019-10-01 NOTE — Telephone Encounter (Signed)
May do 1 refill needs to do follow-up visit later in the spring thank you

## 2019-10-02 NOTE — Telephone Encounter (Signed)
Please schedule and then route back to nurses to send in refill °

## 2019-10-02 NOTE — Telephone Encounter (Signed)
Scheduled 4/19

## 2019-10-09 ENCOUNTER — Encounter: Payer: Self-pay | Admitting: Family Medicine

## 2019-10-09 DIAGNOSIS — M810 Age-related osteoporosis without current pathological fracture: Secondary | ICD-10-CM | POA: Diagnosis not present

## 2019-10-09 DIAGNOSIS — M25439 Effusion, unspecified wrist: Secondary | ICD-10-CM | POA: Diagnosis not present

## 2019-10-09 DIAGNOSIS — M8589 Other specified disorders of bone density and structure, multiple sites: Secondary | ICD-10-CM | POA: Diagnosis not present

## 2019-10-09 DIAGNOSIS — M1990 Unspecified osteoarthritis, unspecified site: Secondary | ICD-10-CM | POA: Diagnosis not present

## 2019-10-09 DIAGNOSIS — M79643 Pain in unspecified hand: Secondary | ICD-10-CM | POA: Diagnosis not present

## 2019-10-09 DIAGNOSIS — M549 Dorsalgia, unspecified: Secondary | ICD-10-CM | POA: Diagnosis not present

## 2019-10-09 DIAGNOSIS — Z7952 Long term (current) use of systemic steroids: Secondary | ICD-10-CM | POA: Diagnosis not present

## 2019-10-09 DIAGNOSIS — M25572 Pain in left ankle and joints of left foot: Secondary | ICD-10-CM | POA: Diagnosis not present

## 2019-10-09 DIAGNOSIS — M0579 Rheumatoid arthritis with rheumatoid factor of multiple sites without organ or systems involvement: Secondary | ICD-10-CM | POA: Diagnosis not present

## 2019-10-17 DIAGNOSIS — Z23 Encounter for immunization: Secondary | ICD-10-CM | POA: Diagnosis not present

## 2019-10-20 DIAGNOSIS — H16223 Keratoconjunctivitis sicca, not specified as Sjogren's, bilateral: Secondary | ICD-10-CM | POA: Diagnosis not present

## 2019-10-20 DIAGNOSIS — H401121 Primary open-angle glaucoma, left eye, mild stage: Secondary | ICD-10-CM | POA: Diagnosis not present

## 2019-10-20 DIAGNOSIS — Z961 Presence of intraocular lens: Secondary | ICD-10-CM | POA: Diagnosis not present

## 2019-10-28 ENCOUNTER — Ambulatory Visit (INDEPENDENT_AMBULATORY_CARE_PROVIDER_SITE_OTHER): Payer: Medicare Other | Admitting: Internal Medicine

## 2019-10-28 ENCOUNTER — Encounter (INDEPENDENT_AMBULATORY_CARE_PROVIDER_SITE_OTHER): Payer: Self-pay | Admitting: Internal Medicine

## 2019-10-28 ENCOUNTER — Other Ambulatory Visit: Payer: Self-pay

## 2019-10-28 VITALS — BP 158/71 | HR 71 | Temp 97.3°F | Ht 65.0 in | Wt 159.1 lb

## 2019-10-28 DIAGNOSIS — K219 Gastro-esophageal reflux disease without esophagitis: Secondary | ICD-10-CM | POA: Diagnosis not present

## 2019-10-28 DIAGNOSIS — K5900 Constipation, unspecified: Secondary | ICD-10-CM | POA: Diagnosis not present

## 2019-10-28 MED ORDER — PANTOPRAZOLE SODIUM 40 MG PO TBEC: 40.0000 mg | DELAYED_RELEASE_TABLET | Freq: Every day | ORAL | 3 refills | Status: DC

## 2019-10-28 NOTE — Progress Notes (Signed)
Presenting complaint;  Follow for chronic GERD and constipation.  Subjective:  Patient is 74 year old Caucasian female who has chronic GERD and constipation who is here for scheduled visit.  She was last seen in September 2019.  Her last colonoscopy was in August 2017 with removal of 12 mm tubular adenoma.  She also had ulceration splenic flexure felt to be ischemic and/or NSAID injury.  And her next colonoscopy would be due in August 2022. Patient states she is doing well.  She recalls having had a bad episode of heartburn not too long ago when she ate late at night.  She has not had any more episodes.  She denies hoarseness chronic cough sore throat or dysphagia.  She used to have nausea but she has not had an episode recently.  She says she is using dehydrated to dry fruits which which she can then eat.  She feels it is helping with her defecation/constipation. She is watching her diet.  He is trying to lose weight.  She has lost 5 pounds since her last visit. She feels she is doing better as far as her constipation is concerned.  She generally has 4-5 stools per week.  She is using polyethylene glycol on as-needed basis. She also complains of burning to left foot and skin turns red every evening.  She has similar symptoms but much milder involving the right foot.  The symptoms started 2 weeks ago.  Current Medications: Outpatient Encounter Medications as of 10/28/2019  Medication Sig  . acetaminophen (TYLENOL) 650 MG CR tablet Take 650 mg by mouth 2 (two) times daily.   [name] amLODipine (NORVASC) 2.5 MG tablet Take 1 tablet (2.5 mg total) by mouth daily.  [name] b complex vitamins tablet Take 1 tablet by mouth daily.  . Calcium Carbonate-Vitamin D (CALCIUM 600 + D PO) Take 1 tablet by mouth daily.   . Cholecalciferol (VITAMIN D3) 50 MCG (2000 UT) capsule Take 5,000 Units by mouth. Once a week  . docusate sodium (COLACE) 100 MG capsule Take 200 mg by mouth at bedtime.  [name] FIBER ADULT GUMMIES PO Take by  mouth. One bid  . gabapentin (NEURONTIN) 300 MG capsule Take 1 capsule (300 mg total) by mouth 2 (two) times daily. 3rd dose if needed  . HYDROmorphone (DILAUDID) 2 MG tablet One po three times daily as needed  . hydroxychloroquine (PLAQUENIL) 200 MG tablet Take 1 tablet (200 mg total) by mouth 2 (two) times daily.  [name] levothyroxine (SYNTHROID) 112 MCG tablet Take 1 tablet (112 mcg total) by mouth daily.  . Multiple Vitamin (MULTIVITAMIN WITH MINERALS) TABS Take 1 tablet by mouth daily.  [name] OVER THE COUNTER MEDICATION Herbal Supplement - Takes 1 by mouth BID. To help with digestion and joint inflammation.  . pantoprazole (PROTONIX) 40 MG tablet Take 1 tablet by mouth once daily  . polyethylene glycol (MIRALAX / GLYCOLAX) packet Take 17 g by mouth daily as needed for mild constipation. Constipation   . predniSONE (DELTASONE) 2.5 MG tablet Take 7.5 mg by mouth daily with breakfast.  . Probiotic Product (PROBIOTIC DAILY PO) Take by mouth daily.  . promethazine (PHENERGAN) 25 MG tablet Take 1 tablet (25 mg total) by mouth 2 (two) times daily as needed for nausea or vomiting.  [name] Propylene Glycol (SYSTANE BALANCE OP) Apply to eye 2 (two) times daily. Patient states that she uses 1 drop in each eye.  . timolol (TIMOPTIC) 0.5 % ophthalmic solution Place 1 drop into the left eye daily.   [name]  traMADol (ULTRAM) 50 MG tablet TAKE 1 TABLET BY MOUTH 4 TIMES AS NEEDED.  [name] Zoledronic Acid (RECLAST IV) Inject into the vein. yearly  . [DISCONTINUED] nitrofurantoin, macrocrystal-monohydrate, (MACROBID) 100 MG capsule Take one capsule po BID for 5 days (Patient not taking: Reported on 10/28/2019)   No facility-administered encounter medications on file as of 10/28/2019.     Objective: Blood pressure (!) 158/71, pulse 71, temperature (!) 97.3 F (36.3 C), temperature source Temporal, height 5\' 5"  (1.651 m), weight 159 lb 1.6 oz (72.2 kg). Patient is alert and in no acute distress. She is wearing facial  mask. Conjunctiva is pink. Sclera is nonicteric Oropharyngeal mucosa is normal. No neck masses or thyromegaly noted. Cardiac exam with regular rhythm normal S1 and S2. No murmur or gallop noted. Lungs are clear to auscultation. Abdomen is full but soft and nontender with organomegaly or masses. No LE edema or clubbing noted. She is wearing a left ankle brace and boot with thick soled to compensate for height.   Assessment:  #1.  Chronic GERD.  Patient is doing well with dietary measures and single dose PPI.  She does not feel she is ready to drop the dose to 20 mg for now.  #2.  Chronic constipation.  She is doing well with dietary measures stool softener and as needed polyethylene glycol.  She may consider taking polyethylene glycol on schedule which could be every other day or 4 times a week or even daily at a low dose.  Plan:  Patient will continue pantoprazole 40 mg daily 30 minutes before breakfast.  New prescription sent to patient's mail order pharmacy for 90 doses with 3 refills. Patient advised to take polyethylene glycol anywhere from 8.5 to 17 g on schedule which could be daily every other day or 4 times a week. Office visit in 1 year. I would ask that she discuss her foot symptoms with a rheumatologist.

## 2019-10-28 NOTE — Patient Instructions (Signed)
Consider taking Miralax on schedule be it every other day or 4 times a week.

## 2019-11-10 ENCOUNTER — Other Ambulatory Visit: Payer: Self-pay | Admitting: *Deleted

## 2019-11-10 ENCOUNTER — Telehealth: Payer: Self-pay | Admitting: Family Medicine

## 2019-11-10 DIAGNOSIS — E039 Hypothyroidism, unspecified: Secondary | ICD-10-CM

## 2019-11-10 DIAGNOSIS — Z79899 Other long term (current) drug therapy: Secondary | ICD-10-CM

## 2019-11-10 DIAGNOSIS — E782 Mixed hyperlipidemia: Secondary | ICD-10-CM

## 2019-11-10 DIAGNOSIS — E559 Vitamin D deficiency, unspecified: Secondary | ICD-10-CM

## 2019-11-10 MED ORDER — LEVOTHYROXINE SODIUM 112 MCG PO TABS: 112.0000 ug | ORAL_TABLET | Freq: Every day | ORAL | 0 refills | Status: DC

## 2019-11-10 NOTE — Telephone Encounter (Signed)
Refill sent and labs orders and pt was notified of all

## 2019-11-10 NOTE — Telephone Encounter (Signed)
May have 90-day Keep follow-up later in April with [place] Lipid, liver, metabolic 7, vitamin D, TSH Hypothyroidism hyperlipidemia vitamin D deficiency Try to do lab work before follow-up visit

## 2019-11-10 NOTE — Telephone Encounter (Signed)
Patient is requesting refill on levothyroxine 112 mcg called into [facility] order pharmacy

## 2019-11-14 DIAGNOSIS — Z79899 Other long term (current) drug therapy: Secondary | ICD-10-CM | POA: Diagnosis not present

## 2019-11-14 DIAGNOSIS — E559 Vitamin D deficiency, unspecified: Secondary | ICD-10-CM | POA: Diagnosis not present

## 2019-11-14 DIAGNOSIS — E039 Hypothyroidism, unspecified: Secondary | ICD-10-CM | POA: Diagnosis not present

## 2019-11-14 DIAGNOSIS — E782 Mixed hyperlipidemia: Secondary | ICD-10-CM | POA: Diagnosis not present

## 2019-11-15 LAB — HEPATIC FUNCTION PANEL
ALT: 19 IU/L (ref 0–32)
AST: 35 IU/L (ref 0–40)
Albumin: 4.5 g/dL (ref 3.7–4.7)
Alkaline Phosphatase: 72 IU/L (ref 39–117)
Bilirubin Total: 0.4 mg/dL (ref 0.0–1.2)
Bilirubin, Direct: 0.12 mg/dL (ref 0.00–0.40)
Total Protein: 6.6 g/dL (ref 6.0–8.5)

## 2019-11-15 LAB — LIPID PANEL
Chol/HDL Ratio: 3.1 ratio (ref 0.0–4.4)
Cholesterol, Total: 229 mg/dL — ABNORMAL HIGH (ref 100–199)
HDL: 75 mg/dL (ref >39)
LDL Chol Calc (NIH): 128 mg/dL — ABNORMAL HIGH (ref 0–99)
Triglycerides: 152 mg/dL — ABNORMAL HIGH (ref 0–149)
VLDL Cholesterol Cal: 26 mg/dL (ref 5–40)

## 2019-11-15 LAB — TSH: TSH: 1.33 u[IU]/mL (ref 0.450–4.500)

## 2019-11-15 LAB — BASIC METABOLIC PANEL
BUN/Creatinine Ratio: 28 (ref 12–28)
BUN: 22 mg/dL (ref 8–27)
CO2: 26 mmol/L (ref 20–29)
Calcium: 9.8 mg/dL (ref 8.7–10.3)
Chloride: 103 mmol/L (ref 96–106)
Creatinine, Ser: 0.78 mg/dL (ref 0.57–1.00)
GFR calc Af Amer: 88 mL/min/{1.73_m2} (ref >59)
GFR calc non Af Amer: 77 mL/min/{1.73_m2} (ref >59)
Glucose: 76 mg/dL (ref 65–99)
Potassium: 4.5 mmol/L (ref 3.5–5.2)
Sodium: 143 mmol/L (ref 134–144)

## 2019-11-15 LAB — VITAMIN D 25 HYDROXY (VIT D DEFICIENCY, FRACTURES): Vit D, 25-Hydroxy: 43.8 ng/mL (ref 30.0–100.0)

## 2019-11-24 ENCOUNTER — Telehealth (INDEPENDENT_AMBULATORY_CARE_PROVIDER_SITE_OTHER): Payer: Medicare Other | Admitting: Family Medicine

## 2019-11-24 ENCOUNTER — Telehealth: Payer: Self-pay | Admitting: *Deleted

## 2019-11-24 ENCOUNTER — Other Ambulatory Visit: Payer: Self-pay

## 2019-11-24 ENCOUNTER — Ambulatory Visit: Payer: Medicare Other | Admitting: Family Medicine

## 2019-11-24 VITALS — BP 158/71

## 2019-11-24 DIAGNOSIS — E782 Mixed hyperlipidemia: Secondary | ICD-10-CM

## 2019-11-24 DIAGNOSIS — M0579 Rheumatoid arthritis with rheumatoid factor of multiple sites without organ or systems involvement: Secondary | ICD-10-CM | POA: Diagnosis not present

## 2019-11-24 DIAGNOSIS — I10 Essential (primary) hypertension: Secondary | ICD-10-CM

## 2019-11-24 DIAGNOSIS — E038 Other specified hypothyroidism: Secondary | ICD-10-CM | POA: Diagnosis not present

## 2019-11-24 MED ORDER — AMLODIPINE BESYLATE 2.5 MG PO TABS: 2.5000 mg | ORAL_TABLET | Freq: Every day | ORAL | 1 refills | Status: DC

## 2019-11-24 MED ORDER — PROMETHAZINE HCL 25 MG PO TABS: 25.0000 mg | ORAL_TABLET | Freq: Two times a day (BID) | ORAL | 5 refills | Status: DC | PRN

## 2019-11-24 MED ORDER — TRAMADOL HCL 50 MG PO TABS: ORAL_TABLET | ORAL | 5 refills | Status: DC

## 2019-11-24 MED ORDER — GABAPENTIN 300 MG PO CAPS: 300.0000 mg | ORAL_CAPSULE | Freq: Two times a day (BID) | ORAL | 1 refills | Status: DC

## 2019-11-24 MED ORDER — PANTOPRAZOLE SODIUM 40 MG PO TBEC: 40.0000 mg | DELAYED_RELEASE_TABLET | Freq: Every day | ORAL | 1 refills | Status: DC

## 2019-11-24 MED ORDER — LEVOTHYROXINE SODIUM 112 MCG PO TABS: 112.0000 ug | ORAL_TABLET | Freq: Every day | ORAL | 1 refills | Status: DC

## 2019-11-24 NOTE — Telephone Encounter (Signed)
Ms. Sabrina Adams, Sabrina Adams are scheduled for a virtual visit with your provider today.    Just as we do with appointments in the office, we must obtain your consent to participate.  Your consent will be active for this visit and any virtual visit you may have with one of our providers in the next 365 days.    If you have a MyChart account, I can also send a copy of this consent to you electronically.  All virtual visits are billed to your insurance company just like a traditional visit in the office.  As this is a virtual visit, video technology does not allow for your provider to perform a traditional examination.  This may limit your provider's ability to fully assess your condition.  If your provider identifies any concerns that need to be evaluated in person or the need to arrange testing such as labs, EKG, etc, we will make arrangements to do so.    Although advances in technology are sophisticated, we cannot ensure that it will always work on either your end or our end.  If the connection with a video visit is poor, we may have to switch to a telephone visit.  With either a video or telephone visit, we are not always able to ensure that we have a secure connection.   I need to obtain your verbal consent now.   Are you willing to proceed with your visit today?   Sabrina Adams has provided verbal consent on 11/24/2019 for a virtual visit (video or telephone).   Mitzie Na, RN 11/24/2019  2:54 PM

## 2019-11-24 NOTE — Progress Notes (Signed)
Subjective:    Patient ID: Sabrina Adams, female    DOB: 1945/12/29, 74 y.o.   MRN: [id number]  Hypertension This is a chronic problem. The current episode started more than 1 year ago. Risk factors for coronary artery disease include post-menopausal state. Treatments tried: norvasc. There are no compliance problems.     Fall Risk  07/09/2018 06/27/2018 06/13/2018 05/21/2015 02/16/2014  Falls in the past year? 0 1 0 No No  Comment - Emmi Telephone Survey: data to providers prior to load - - -  Number falls in past yr: - 1 0 - -  Comment - Emmi Telephone Survey Actual Response = 1 - - -  Injury with Fall? - 1 - - -  Risk for fall due to : - - - - -    Patient does have chronic pain with her arthritis she uses tramadol primarily to help her symptoms and only uses hydromorphone when absolutely necessary  Patient also has hyperlipidemia tries to watch her diet  Has hypothyroidism and takes her medicine on a regular basis. Results for orders placed or performed in visit on 11/10/19  Lipid panel  Result Value Ref Range   Cholesterol, Total 229 (H) 100 - 199 mg/dL   Triglycerides 152 (H) 0 - 149 mg/dL   HDL 75 >39 mg/dL   VLDL Cholesterol Cal 26 5 - 40 mg/dL   LDL Chol Calc (NIH) 128 (H) 0 - 99 mg/dL   Chol/HDL Ratio 3.1 0.0 - 4.4 ratio  Hepatic function panel  Result Value Ref Range   Total Protein 6.6 6.0 - 8.5 g/dL   Albumin 4.5 3.7 - 4.7 g/dL   Bilirubin Total 0.4 0.0 - 1.2 mg/dL   Bilirubin, Direct 0.12 0.00 - 0.40 mg/dL   Alkaline Phosphatase 72 39 - 117 IU/L   AST 35 0 - 40 IU/L   ALT 19 0 - 32 IU/L  Basic metabolic panel  Result Value Ref Range   Glucose 76 65 - 99 mg/dL   BUN 22 8 - 27 mg/dL   Creatinine, Ser 0.78 0.57 - 1.00 mg/dL   GFR calc non Af Amer 77 >59 mL/min/1.73   GFR calc Af Amer 88 >59 mL/min/1.73   BUN/Creatinine Ratio 28 12 - 28   Sodium 143 134 - 144 mmol/L   Potassium 4.5 3.5 - 5.2 mmol/L   Chloride 103 96 - 106 mmol/L   CO2 26 20 - 29 mmol/L     Calcium 9.8 8.7 - 10.3 mg/dL  VITAMIN D 25 Hydroxy (Vit-D Deficiency, Fractures)  Result Value Ref Range   Vit D, 25-Hydroxy 43.8 30.0 - 100.0 ng/mL  TSH  Result Value Ref Range   TSH 1.330 0.450 - 4.500 uIU/mL     Review of Systems Virtual Visit via Video Note  I connected with Sabrina Adams on 11/24/19 at  3:00 PM EDT by a video enabled telemedicine application and verified that I am speaking with the correct person using two identifiers.  Location: Patient: home Provider: office   I discussed the limitations of evaluation and management by telemedicine and the availability of in person appointments. The patient expressed understanding and agreed to proceed.  History of Present Illness:    Observations/Objective:   Assessment and Plan:   Follow Up Instructions:    I discussed the assessment and treatment plan with the patient. The patient was provided an opportunity to ask questions and all were answered. The patient agreed with the plan and demonstrated  an understanding of the instructions.   The patient was advised to call back or seek an in-person evaluation if the symptoms worsen or if the condition fails to improve as anticipated.  I provided 22 minutes of non-face-to-face time during this encounter.        Objective:   Physical Exam   Today's visit was via telephone Physical exam was not possible for this visit      Assessment & Plan:  1. Mixed hyperlipidemia Overall decent control with diet with watch diet closely stay physically active  2. Other specified hypothyroidism Takes her medicine regular basis lab work looks good  3. Essential hypertension, benign Blood pressure reportedly has been good previously but a recent reading by a doctor showed elevated blood pressure recommend patient do a follow-up in person and next visit in several months sooner problems check blood pressure at home  4. Rheumatoid arthritis involving multiple sites  with positive rheumatoid factor (HCC) Tramadol refill sent in utilize hydromorphone only when necessary caution drowsiness

## 2019-12-22 DIAGNOSIS — Z961 Presence of intraocular lens: Secondary | ICD-10-CM | POA: Diagnosis not present

## 2019-12-22 DIAGNOSIS — H401121 Primary open-angle glaucoma, left eye, mild stage: Secondary | ICD-10-CM | POA: Diagnosis not present

## 2020-02-13 ENCOUNTER — Telehealth: Payer: Self-pay | Admitting: *Deleted

## 2020-02-13 NOTE — Telephone Encounter (Signed)
Blood pressure readings reviewed by Dr Nicki Reaper: Per Dr Nicki Reaper:   Majority of BP good control- continue as is- send in more readings in month

## 2020-02-13 NOTE — Telephone Encounter (Signed)
Left message to return call 

## 2020-02-13 NOTE — Telephone Encounter (Signed)
Patient notified and verbalized understanding and will send more readings in a month

## 2020-04-14 DIAGNOSIS — M25572 Pain in left ankle and joints of left foot: Secondary | ICD-10-CM | POA: Diagnosis not present

## 2020-04-14 DIAGNOSIS — Z7952 Long term (current) use of systemic steroids: Secondary | ICD-10-CM | POA: Diagnosis not present

## 2020-04-14 DIAGNOSIS — M79643 Pain in unspecified hand: Secondary | ICD-10-CM | POA: Diagnosis not present

## 2020-04-14 DIAGNOSIS — M25439 Effusion, unspecified wrist: Secondary | ICD-10-CM | POA: Diagnosis not present

## 2020-04-14 DIAGNOSIS — M1990 Unspecified osteoarthritis, unspecified site: Secondary | ICD-10-CM | POA: Diagnosis not present

## 2020-04-14 DIAGNOSIS — M549 Dorsalgia, unspecified: Secondary | ICD-10-CM | POA: Diagnosis not present

## 2020-04-14 DIAGNOSIS — M0579 Rheumatoid arthritis with rheumatoid factor of multiple sites without organ or systems involvement: Secondary | ICD-10-CM | POA: Diagnosis not present

## 2020-04-14 DIAGNOSIS — M810 Age-related osteoporosis without current pathological fracture: Secondary | ICD-10-CM | POA: Diagnosis not present

## 2020-04-22 DIAGNOSIS — M810 Age-related osteoporosis without current pathological fracture: Secondary | ICD-10-CM | POA: Diagnosis not present

## 2020-05-05 ENCOUNTER — Other Ambulatory Visit (HOSPITAL_COMMUNITY): Payer: Self-pay | Admitting: Family Medicine

## 2020-05-05 DIAGNOSIS — Z1231 Encounter for screening mammogram for malignant neoplasm of breast: Secondary | ICD-10-CM

## 2020-05-17 ENCOUNTER — Other Ambulatory Visit (INDEPENDENT_AMBULATORY_CARE_PROVIDER_SITE_OTHER): Payer: Medicare Other

## 2020-05-17 DIAGNOSIS — Z23 Encounter for immunization: Secondary | ICD-10-CM | POA: Diagnosis not present

## 2020-05-19 ENCOUNTER — Other Ambulatory Visit: Payer: Self-pay

## 2020-05-19 MED ORDER — LEVOTHYROXINE SODIUM 112 MCG PO TABS: 112.0000 ug | ORAL_TABLET | Freq: Every day | ORAL | 1 refills | Status: DC

## 2020-05-31 ENCOUNTER — Encounter: Payer: Self-pay | Admitting: Family Medicine

## 2020-05-31 ENCOUNTER — Other Ambulatory Visit: Payer: Self-pay | Admitting: Family Medicine

## 2020-05-31 MED ORDER — TRAMADOL HCL 50 MG PO TABS: ORAL_TABLET | ORAL | 5 refills | Status: DC

## 2020-06-03 ENCOUNTER — Other Ambulatory Visit: Payer: Self-pay

## 2020-06-03 ENCOUNTER — Ambulatory Visit (HOSPITAL_COMMUNITY)
Admission: RE | Admit: 2020-06-03 | Discharge: 2020-06-03 | Disposition: A | Discharge status: Home / Self Care | Payer: Medicare Other | Source: Ambulatory Visit | Attending: Family Medicine | Admitting: Family Medicine

## 2020-06-03 DIAGNOSIS — Z1231 Encounter for screening mammogram for malignant neoplasm of breast: Secondary | ICD-10-CM | POA: Diagnosis not present

## 2020-06-16 ENCOUNTER — Ambulatory Visit (INDEPENDENT_AMBULATORY_CARE_PROVIDER_SITE_OTHER): Payer: Medicare Other | Admitting: Family Medicine

## 2020-06-16 ENCOUNTER — Other Ambulatory Visit: Payer: Self-pay

## 2020-06-16 ENCOUNTER — Encounter: Payer: Self-pay | Admitting: Family Medicine

## 2020-06-16 VITALS — BP 138/88 | HR 97 | Temp 97.4°F | Ht 65.0 in | Wt 163.4 lb

## 2020-06-16 DIAGNOSIS — I700 Atherosclerosis of aorta: Secondary | ICD-10-CM | POA: Diagnosis not present

## 2020-06-16 DIAGNOSIS — Z23 Encounter for immunization: Secondary | ICD-10-CM

## 2020-06-16 DIAGNOSIS — T148XXA Other injury of unspecified body region, initial encounter: Secondary | ICD-10-CM

## 2020-06-16 DIAGNOSIS — E782 Mixed hyperlipidemia: Secondary | ICD-10-CM | POA: Diagnosis not present

## 2020-06-16 DIAGNOSIS — Z0000 Encounter for general adult medical examination without abnormal findings: Secondary | ICD-10-CM

## 2020-06-16 DIAGNOSIS — S20314A Abrasion of middle front wall of thorax, initial encounter: Secondary | ICD-10-CM

## 2020-06-16 DIAGNOSIS — E038 Other specified hypothyroidism: Secondary | ICD-10-CM

## 2020-06-16 DIAGNOSIS — W5503XA Scratched by cat, initial encounter: Secondary | ICD-10-CM

## 2020-06-16 DIAGNOSIS — S20411A Abrasion of right back wall of thorax, initial encounter: Secondary | ICD-10-CM | POA: Diagnosis not present

## 2020-06-16 DIAGNOSIS — I10 Essential (primary) hypertension: Secondary | ICD-10-CM | POA: Diagnosis not present

## 2020-06-16 NOTE — Progress Notes (Signed)
Subjective:    Patient ID: Sabrina Adams, female    DOB: [birth year], 74 y.o.   MRN: [id number]  HPI AWV- Annual Wellness Visit  The patient was seen for their annual wellness visit. The patient's past medical history, surgical history, and family history were reviewed. Pertinent vaccines were reviewed ( tetanus, pneumonia, shingles, flu) The patient's medication list was reviewed and updated.  The height and weight were entered.  BMI recorded in electronic record elsewhere  Cognitive screening was completed. Outcome of Mini - Cog:    Falls /depression screening electronically recorded within record elsewhere  Current tobacco usage: none (All patients who use tobacco were given written and verbal information on quitting)  Recent listing of emergency department/hospitalizations over the past year were reviewed.  current specialist the patient sees on a regular basis: Rheumatology, Dr.Rehman, Dr.Gramick (hand specialist), Dr.Rosick (Eye Dr)   Elvin So annual wellness visit patient questionnaire was reviewed.  A written screening schedule for the patient for the next 5-10 years was given. Appropriate discussion of followup regarding next visit was discussed.      Review of Systems  Constitutional: Negative for activity change, appetite change and fatigue.  HENT: Negative for congestion and rhinorrhea.   Respiratory: Negative for cough and shortness of breath.   Cardiovascular: Negative for chest pain and leg swelling.  Gastrointestinal: Negative for abdominal pain and diarrhea.  Endocrine: Negative for polydipsia and polyphagia.  Musculoskeletal: Positive for arthralgias.  Skin: Negative for color change.  Neurological: Negative for dizziness and weakness.  Psychiatric/Behavioral: Negative for behavioral problems and confusion.       Objective:   Physical Exam Vitals reviewed.  Constitutional:      General: She is not in acute distress. HENT:     Head:  Normocephalic and atraumatic.  Eyes:     General:        Right eye: No discharge.        Left eye: No discharge.  Neck:     Trachea: No tracheal deviation.  Cardiovascular:     Rate and Rhythm: Normal rate and regular rhythm.     Heart sounds: Normal heart sounds. No murmur heard.   Pulmonary:     Effort: Pulmonary effort is normal. No respiratory distress.     Breath sounds: Normal breath sounds.  Lymphadenopathy:     Cervical: No cervical adenopathy.  Skin:    General: Skin is warm and dry.  Neurological:     Mental Status: She is alert.     Coordination: Coordination normal.  Psychiatric:        Behavior: Behavior normal.     Patient does have abrasions and scratches on Chest and Upper Back from her cat She is due for a tetanus shot      Assessment & Plan:  1. Essential hypertension, benign Slight elevation here but bp at home are good - Lipid Profile - TSH Blood pressure today is elevated but on readings at home they have been under good control she will check readings several times over the next couple weeks and send them to [place] 2. Encounter for subsequent annual wellness visit (AWV) in Medicare patient Adult wellness-complete.wellness physical was conducted today. Importance of diet and exercise were discussed in detail.  In addition to this a discussion regarding safety was also covered. We also reviewed over immunizations and gave recommendations regarding current immunization needed for age.  In addition to this additional areas were also touched on including: Preventative health exams needed:  Colonoscopy 2027  Patient was advised yearly wellness exam   3. Abrasion Cat scratches just recent needs tdap - Tdap vaccine greater than or equal to 7yo IM  4. Animal scratch See above not infected - Tdap vaccine greater than or equal to 7yo IM  5. Other specified hypothyroidism Continue meds and check labs - Lipid Profile - TSH  6. Mixed  hyperlipidemia Get baseline watch diet Will start pravastatin - Lipid Profile - TSH  7. Atherosclerosis of aorta (HCC) Will start pravastatin after labs are back  8. Need for vaccination today - Tdap vaccine greater than or equal to 7yo IM

## 2020-06-21 ENCOUNTER — Encounter: Payer: Self-pay | Admitting: Family Medicine

## 2020-06-21 DIAGNOSIS — H401121 Primary open-angle glaucoma, left eye, mild stage: Secondary | ICD-10-CM | POA: Diagnosis not present

## 2020-06-21 DIAGNOSIS — H16223 Keratoconjunctivitis sicca, not specified as Sjogren's, bilateral: Secondary | ICD-10-CM | POA: Diagnosis not present

## 2020-06-21 DIAGNOSIS — Z961 Presence of intraocular lens: Secondary | ICD-10-CM | POA: Diagnosis not present

## 2020-06-22 ENCOUNTER — Encounter: Payer: Self-pay | Admitting: Family Medicine

## 2020-06-22 DIAGNOSIS — I10 Essential (primary) hypertension: Secondary | ICD-10-CM | POA: Diagnosis not present

## 2020-06-22 DIAGNOSIS — E038 Other specified hypothyroidism: Secondary | ICD-10-CM | POA: Diagnosis not present

## 2020-06-22 DIAGNOSIS — E782 Mixed hyperlipidemia: Secondary | ICD-10-CM | POA: Diagnosis not present

## 2020-06-23 LAB — LIPID PANEL
Chol/HDL Ratio: 3.3 ratio (ref 0.0–4.4)
Cholesterol, Total: 246 mg/dL — ABNORMAL HIGH (ref 100–199)
HDL: 75 mg/dL (ref >39)
LDL Chol Calc (NIH): 140 mg/dL — ABNORMAL HIGH (ref 0–99)
Triglycerides: 174 mg/dL — ABNORMAL HIGH (ref 0–149)
VLDL Cholesterol Cal: 31 mg/dL (ref 5–40)

## 2020-06-23 LAB — TSH: TSH: 2.62 u[IU]/mL (ref 0.450–4.500)

## 2020-06-24 NOTE — Telephone Encounter (Signed)
Print out of billing information that showed patient had a physical was mailed to the patient 2 days ago.  I will call Sabrina Adams and let her know that she should be getting it in the mail any day now.

## 2020-07-09 ENCOUNTER — Other Ambulatory Visit: Payer: Self-pay | Admitting: *Deleted

## 2020-07-09 MED ORDER — AMLODIPINE BESYLATE 2.5 MG PO TABS
2.5000 mg | ORAL_TABLET | Freq: Every day | ORAL | 1 refills | Status: DC
Start: 2020-07-09 — End: 2020-11-17

## 2020-07-09 MED ORDER — PANTOPRAZOLE SODIUM 40 MG PO TBEC
40.0000 mg | DELAYED_RELEASE_TABLET | Freq: Every day | ORAL | 1 refills | Status: DC
Start: 2020-07-09 — End: 2020-11-17

## 2020-07-19 DIAGNOSIS — Z23 Encounter for immunization: Secondary | ICD-10-CM | POA: Diagnosis not present

## 2020-09-27 ENCOUNTER — Other Ambulatory Visit: Payer: Self-pay

## 2020-09-27 MED ORDER — LEVOTHYROXINE SODIUM 112 MCG PO TABS
112.0000 ug | ORAL_TABLET | Freq: Every day | ORAL | 1 refills | Status: DC
Start: 2020-09-27 — End: 2020-12-14

## 2020-10-07 ENCOUNTER — Ambulatory Visit (INDEPENDENT_AMBULATORY_CARE_PROVIDER_SITE_OTHER): Payer: Medicare Other | Admitting: Family Medicine

## 2020-10-07 ENCOUNTER — Other Ambulatory Visit: Payer: Self-pay

## 2020-10-07 DIAGNOSIS — J029 Acute pharyngitis, unspecified: Secondary | ICD-10-CM | POA: Diagnosis not present

## 2020-10-07 NOTE — Progress Notes (Signed)
   Subjective:    Patient ID: Sabrina Adams, female    DOB: 10-Aug-1945, 75 y.o.   MRN: [id number]  HPI  Patient presents today with respiratory illness Number of days present- last 24 hours  Symptoms include- sore throat  Presence of worrisome signs (severe shortness of breath, lethargy, etc.) - none  Recent/current visit to urgent care or ER- none  Recent direct exposure to Covid- none  Any current Covid testing- none Patient with sore throat slight runny nose no wheezing or difficulty breathing no vomiting or diarrhea no severe headache   Review of Systems    See above Objective:   Physical Exam  Lungs clear respiratory rate normal heart regular HEENT benign      Assessment & Plan:  Viral syndrome Covid test taken supportive care measures discussed follow-up if problems if progressive troubles notify us

## 2020-10-08 LAB — NOVEL CORONAVIRUS, NAA: SARS-CoV-2, NAA: NOT DETECTED

## 2020-10-08 LAB — SARS-COV-2, NAA 2 DAY TAT

## 2020-10-12 DIAGNOSIS — M25439 Effusion, unspecified wrist: Secondary | ICD-10-CM | POA: Diagnosis not present

## 2020-10-12 DIAGNOSIS — M810 Age-related osteoporosis without current pathological fracture: Secondary | ICD-10-CM | POA: Diagnosis not present

## 2020-10-12 DIAGNOSIS — M2550 Pain in unspecified joint: Secondary | ICD-10-CM | POA: Diagnosis not present

## 2020-10-12 DIAGNOSIS — M1990 Unspecified osteoarthritis, unspecified site: Secondary | ICD-10-CM | POA: Diagnosis not present

## 2020-10-12 DIAGNOSIS — M549 Dorsalgia, unspecified: Secondary | ICD-10-CM | POA: Diagnosis not present

## 2020-10-12 DIAGNOSIS — Z7952 Long term (current) use of systemic steroids: Secondary | ICD-10-CM | POA: Diagnosis not present

## 2020-10-12 DIAGNOSIS — M79643 Pain in unspecified hand: Secondary | ICD-10-CM | POA: Diagnosis not present

## 2020-10-12 DIAGNOSIS — M0579 Rheumatoid arthritis with rheumatoid factor of multiple sites without organ or systems involvement: Secondary | ICD-10-CM | POA: Diagnosis not present

## 2020-10-13 ENCOUNTER — Telehealth: Payer: Self-pay | Admitting: Family Medicine

## 2020-10-13 MED ORDER — CEFPROZIL 500 MG PO TABS: ORAL_TABLET | ORAL | 0 refills | Status: DC

## 2020-10-13 NOTE — Telephone Encounter (Signed)
Please advise. Thank you

## 2020-10-13 NOTE — Telephone Encounter (Signed)
Coughing up yellowish mucus. No fever, sob when talking. Started yesterday. Pt advised to go to ED if sob is worse. Appt made for tomorrow.  [place] [facility].

## 2020-10-13 NOTE — Telephone Encounter (Signed)
Medication sent to pharmacy and pt is aware 

## 2020-10-13 NOTE — Telephone Encounter (Signed)
Patient has sore throat,congestion ,bad cough wanting something called into [place]

## 2020-10-13 NOTE — Telephone Encounter (Signed)
Cefzil 500 mg twice daily for 10 days keep follow-up visit for tomorrow

## 2020-10-14 ENCOUNTER — Ambulatory Visit (INDEPENDENT_AMBULATORY_CARE_PROVIDER_SITE_OTHER): Payer: Medicare Other | Admitting: Family Medicine

## 2020-10-14 ENCOUNTER — Other Ambulatory Visit: Payer: Self-pay

## 2020-10-14 DIAGNOSIS — J019 Acute sinusitis, unspecified: Secondary | ICD-10-CM | POA: Diagnosis not present

## 2020-10-14 MED ORDER — ONDANSETRON HCL 4 MG PO TABS: ORAL_TABLET | ORAL | 1 refills | Status: DC

## 2020-10-14 NOTE — Progress Notes (Signed)
   Subjective:    Patient ID: Sabrina Adams, female    DOB: 01/28/1946, 75 y.o.   MRN: [id number]  HPI  Patient arrives with cough, congestion and headache for over a week. Started antibiotic yesterday. Significant head congestion drainage coughing sinus pressure not feeling good wheezing not present Review of Systems See above    Objective:   Physical Exam Lungs clear heart regular HEENT benign       Assessment & Plan:  Acute rhinosinusitis Probable underlying viral illness No sign of pneumonia Antibiotic prescribed warning signs discussed follow-up if problems

## 2020-11-02 ENCOUNTER — Encounter (INDEPENDENT_AMBULATORY_CARE_PROVIDER_SITE_OTHER): Payer: Self-pay | Admitting: Internal Medicine

## 2020-11-02 ENCOUNTER — Ambulatory Visit (INDEPENDENT_AMBULATORY_CARE_PROVIDER_SITE_OTHER): Payer: Medicare Other | Admitting: Internal Medicine

## 2020-11-02 ENCOUNTER — Other Ambulatory Visit: Payer: Self-pay

## 2020-11-02 VITALS — BP 150/81 | HR 63 | Temp 98.1°F | Ht 65.0 in | Wt 161.0 lb

## 2020-11-02 DIAGNOSIS — K219 Gastro-esophageal reflux disease without esophagitis: Secondary | ICD-10-CM | POA: Diagnosis not present

## 2020-11-02 DIAGNOSIS — Z86010 Personal history of colonic polyps: Secondary | ICD-10-CM

## 2020-11-02 DIAGNOSIS — K5900 Constipation, unspecified: Secondary | ICD-10-CM | POA: Diagnosis not present

## 2020-11-02 NOTE — Patient Instructions (Signed)
Surveillance colonoscopy to be scheduled in August 2022.

## 2020-11-02 NOTE — Progress Notes (Signed)
Presenting complaint;  Follow for chronic GERD and constipation. History of colonic adenomas.  Database and subjective:  Patient is 75 year old Caucasian female who has chronic GERD constipation as well as history of colonic adenomas who is here for scheduled visit.  Her last visit was in March 2021.  Her last colonoscopy was in August 2017 with removal of 12 mm tubular adenoma.  That exam also revealed focal ulceration at splenic flexure felt to be ischemic and are due to NSAID. She states she is doing well.  She feels pantoprazole is working.  She also watches her diet.  She says she does drink tea but it is very very light.  She denies dysphagia nausea vomiting hoarseness or chronic cough.  Her appetite is good.  Her weight is increased by 2 pounds since her last visit. She says stool frequency has improved.  She has at least 3-4 bowel movements per week.  She denies melena or rectal bleeding.  She is taking polyethylene glycol on as-needed basis rather than daily.  She says need for pain medication is decreased.  She is having to take hydromorphone no more than 2-3 times in a month and tramadol twice daily. She is requesting pantoprazole prescription.  She is status post resection of sigmoid colon for complicated diverticulitis with colostomy in February 2007 with subsequent reversal of colostomy.  Current Medications: Outpatient Encounter Medications as of 11/02/2020  Medication Sig  . acetaminophen (TYLENOL) 650 MG CR tablet Take 650 mg by mouth 2 (two) times daily.   [name] amLODipine (NORVASC) 2.5 MG tablet Take 1 tablet (2.5 mg total) by mouth daily.  [name] b complex vitamins tablet Take 1 tablet by mouth daily.  . Calcium Carbonate-Vitamin D (CALCIUM 600 + D PO) Take 2 tablets by mouth daily.  . Cholecalciferol (VITAMIN D3) 50 MCG (2000 UT) capsule Take 5,000 Units by mouth. Once a week  . docusate sodium (COLACE) 100 MG capsule Take 200 mg by mouth at bedtime.  [name] FIBER ADULT GUMMIES PO Take  by mouth. One bid  . gabapentin (NEURONTIN) 300 MG capsule Take 1 capsule (300 mg total) by mouth 2 (two) times daily. 3rd dose if needed  . HYDROmorphone (DILAUDID) 2 MG tablet One po three times daily as needed  . hydroxychloroquine (PLAQUENIL) 200 MG tablet Take 1 tablet (200 mg total) by mouth 2 (two) times daily.  [name] levothyroxine (SYNTHROID) 112 MCG tablet Take 1 tablet (112 mcg total) by mouth daily.  . Multiple Vitamin (MULTIVITAMIN WITH MINERALS) TABS Take 1 tablet by mouth daily.  [name] OVER THE COUNTER MEDICATION Take 2 capsules by mouth 2 (two) times daily. Herbal Supplement - Takes 1 by mouth BID. To help with digestion and joint inflammation.  . pantoprazole (PROTONIX) 40 MG tablet Take 1 tablet (40 mg total) by mouth daily before breakfast.  . polyethylene glycol (MIRALAX / GLYCOLAX) packet Take 17 g by mouth daily as needed for mild constipation. Constipation  . predniSONE (DELTASONE) 2.5 MG tablet Take 7.5 mg by mouth daily with breakfast.  . Probiotic Product (PROBIOTIC DAILY PO) Take by mouth daily.  . promethazine (PHENERGAN) 25 MG tablet Take 1 tablet (25 mg total) by mouth 2 (two) times daily as needed for nausea or vomiting. (Patient taking differently: Take 12.5 mg by mouth 2 (two) times daily as needed for nausea or vomiting.)  . Propylene Glycol (SYSTANE BALANCE OP) Apply to eye 2 (two) times daily. Patient states that she uses 1 drop in each eye.  . timolol (  TIMOPTIC) 0.5 % ophthalmic solution Place 1 drop into the left eye daily.   . traMADol (ULTRAM) 50 MG tablet TAKE 1 TABLET BY MOUTH 4 TIMES AS NEEDED.  [name] Zoledronic Acid (RECLAST IV) Inject into the vein. yearly  . [DISCONTINUED] cefPROZIL (CEFZIL) 500 MG tablet Take one tablet po BID for 10 days (Patient not taking: Reported on 11/02/2020)  . [DISCONTINUED] ondansetron (ZOFRAN) 4 MG tablet Take one tablet po mouth TID prn nausea (Patient not taking: Reported on 11/02/2020)   No facility-administered encounter medications  on file as of 11/02/2020.     Objective: Blood pressure (!) 150/81, pulse 63, temperature 98.1 F (36.7 C), temperature source Oral, height 5\' 5"  (1.651 m), weight 161 lb (73 kg). Patient is alert and in no acute distress. She has bilateral exophthalmos. Conjunctiva is pink. Sclera is nonicteric Oropharyngeal mucosa is normal. No neck masses or thyromegaly noted. Cardiac exam with regular rhythm normal S1 and S2. No murmur or gallop noted. Lungs are clear to auscultation. Abdomen is symmetrical.  She has lower midline scar along with horizontal scar in left mid lower abdomen.  On palpation abdomen is soft and nontender with organomegaly or masses. No LE edema or clubbing noted. She has deformity to both hands secondary to rheumatoid arthritis.  Grip strength is bilaterally diminished.   Assessment:  #1.  Chronic GERD.  She is doing well with therapy.  It would be appropriate to keep her on PPI as she also has high risk of peptic ulcer disease given her age and multiple comorbidities.  #2.  Chronic constipation.  Constipation has improved with less need for polyethylene glycol because she is requiring less analgesic.  #3.  History of colonic adenomas.  She will be due for surveillance colonoscopy later this year.  Plan:  New prescription for pantoprazole sent to patient's mail order pharmacy.  Dose is 40 mg 30 minutes before breakfast daily.  90 doses with 3 refills. We will schedule surveillance colonoscopy in August 2022. Office visit in 1 year.

## 2020-11-08 ENCOUNTER — Other Ambulatory Visit: Payer: Self-pay | Admitting: Family Medicine

## 2020-11-17 ENCOUNTER — Other Ambulatory Visit: Payer: Self-pay

## 2020-11-17 MED ORDER — GABAPENTIN 300 MG PO CAPS
300.0000 mg | ORAL_CAPSULE | Freq: Two times a day (BID) | ORAL | 1 refills | Status: DC
Start: 2020-11-17 — End: 2021-07-05

## 2020-11-17 MED ORDER — PANTOPRAZOLE SODIUM 40 MG PO TBEC
40.0000 mg | DELAYED_RELEASE_TABLET | Freq: Every day | ORAL | 1 refills | Status: DC
Start: 2020-11-17 — End: 2021-03-30

## 2020-11-17 MED ORDER — AMLODIPINE BESYLATE 2.5 MG PO TABS
2.5000 mg | ORAL_TABLET | Freq: Every day | ORAL | 1 refills | Status: DC
Start: 2020-11-17 — End: 2021-03-30

## 2020-12-14 ENCOUNTER — Ambulatory Visit (INDEPENDENT_AMBULATORY_CARE_PROVIDER_SITE_OTHER): Payer: Medicare Other | Admitting: Family Medicine

## 2020-12-14 ENCOUNTER — Other Ambulatory Visit: Payer: Self-pay

## 2020-12-14 ENCOUNTER — Encounter: Payer: Self-pay | Admitting: Family Medicine

## 2020-12-14 VITALS — BP 128/76 | HR 70 | Temp 98.2°F | Ht 65.0 in | Wt 157.0 lb

## 2020-12-14 DIAGNOSIS — M0579 Rheumatoid arthritis with rheumatoid factor of multiple sites without organ or systems involvement: Secondary | ICD-10-CM

## 2020-12-14 DIAGNOSIS — G894 Chronic pain syndrome: Secondary | ICD-10-CM

## 2020-12-14 DIAGNOSIS — E782 Mixed hyperlipidemia: Secondary | ICD-10-CM | POA: Diagnosis not present

## 2020-12-14 DIAGNOSIS — I10 Essential (primary) hypertension: Secondary | ICD-10-CM | POA: Diagnosis not present

## 2020-12-14 DIAGNOSIS — E038 Other specified hypothyroidism: Secondary | ICD-10-CM

## 2020-12-14 DIAGNOSIS — E559 Vitamin D deficiency, unspecified: Secondary | ICD-10-CM | POA: Diagnosis not present

## 2020-12-14 DIAGNOSIS — I700 Atherosclerosis of aorta: Secondary | ICD-10-CM

## 2020-12-14 MED ORDER — PRAVASTATIN SODIUM 20 MG PO TABS: 20.0000 mg | ORAL_TABLET | Freq: Every day | ORAL | 5 refills | Status: DC

## 2020-12-14 MED ORDER — TRAMADOL HCL 50 MG PO TABS: ORAL_TABLET | ORAL | 5 refills | Status: DC

## 2020-12-14 MED ORDER — LEVOTHYROXINE SODIUM 112 MCG PO TABS
112.0000 ug | ORAL_TABLET | Freq: Every day | ORAL | 1 refills | Status: DC
Start: 2020-12-14 — End: 2021-07-05

## 2020-12-14 NOTE — Progress Notes (Signed)
Subjective:    Patient ID: Sabrina Adams, female    DOB: 11-Oct-1945, 75 y.o.   MRN: [id number]  Hypertension This is a chronic problem. Pertinent negatives include no chest pain or shortness of breath. Treatments tried: amlodipine.   Essential hypertension, benign  Other specified hypothyroidism - Plan: TSH, T4, free  Mixed hyperlipidemia - Plan: Lipid panel  Atherosclerosis of aorta (HCC)  Vitamin D deficiency - Plan: VITAMIN D 25 Hydroxy (Vit-D Deficiency, Fractures)  Patient does have chronic pain of her back and arthritis.  Uses tramadol primarily.  Occasionally uses Dilaudid.  Has been very responsible with her medicines drug registry checked medication does allow her to function better refills given  Hyperlipidemia should be on a statin because of aortic atherosclerosis patient agrees  This patient was seen today for chronic pain  The medication list was reviewed and updated.  Location of Pain for which the patient has been treated with regarding narcotics: Mid back low back also in the joints  Onset of this pain: Has had this for years   -Compliance with medication: Is compliant with the tramadol  - Number patient states they take daily: Takes approximately 4/day  -when was the last dose patient took?  Earlier today  The patient was advised the importance of maintaining medication and not using illegal substances with these.  Here for refills and follow up  The patient was educated that we can provide 3 monthly scripts for their medication, it is their responsibility to follow the instructions.  Side effects or complications from medications: Denies any side effects  Patient is aware that pain medications are meant to minimize the severity of the pain to allow their pain levels to improve to allow for better function. They are aware of that pain medications cannot totally remove their pain.  Due for UDT ( at least once per year) : Yearly  Scale of 1 to 10  ( 1 is least 10 is most) Your pain level without the medicine: 8 Your pain level with medication 5  Scale 1 to 10 ( 1-helps very little, 10 helps very well) How well does your pain medication reduce your pain so you can function better through out the day?  Certainly allows to reduce her pain to allow her to function better without it she would have a very difficult time  Quality of the pain: Deep ache  Persistence of the pain: 24 hours a day  Modifying factors: Worse with activity        Review of Systems  Constitutional: Negative for activity change and appetite change.  HENT: Negative for congestion and rhinorrhea.   Respiratory: Negative for cough and shortness of breath.   Cardiovascular: Negative for chest pain and leg swelling.  Gastrointestinal: Negative for abdominal pain, nausea and vomiting.  Musculoskeletal: Positive for arthralgias and back pain.  Skin: Negative for color change.  Neurological: Negative for dizziness and weakness.  Psychiatric/Behavioral: Negative for agitation and confusion.       Objective:   Physical Exam Vitals reviewed.  Constitutional:      General: She is not in acute distress. HENT:     Head: Normocephalic.  Cardiovascular:     Rate and Rhythm: Normal rate and regular rhythm.     Heart sounds: Normal heart sounds. No murmur heard.   Pulmonary:     Effort: Pulmonary effort is normal.     Breath sounds: Normal breath sounds.  Lymphadenopathy:     Cervical: No cervical adenopathy.  Neurological:     Mental Status: She is alert.  Psychiatric:        Behavior: Behavior normal.            Assessment & Plan:  1. Essential hypertension, benign Blood pressure decent control continue current measures  2. Other specified hypothyroidism Continue thyroid medicine check lab work - TSH - T4, free  3. Mixed hyperlipidemia Check cholesterol before next visit start statin because of aortic atherosclerosis recheck lipid liver in  8 to 12 weeks - Lipid panel  4. Atherosclerosis of aorta (HCC) Noted on CAT scan  5. Vitamin D deficiency Continue vitamin D - VITAMIN D 25 Hydroxy (Vit-D Deficiency, Fractures)  6. Chronic pain syndrome The patient was seen in followup for chronic pain. A review over at their current pain status was discussed. Drug registry was checked. Prescriptions were given.  Regular follow-up recommended. Discussion was held regarding the importance of compliance with medication as well as pain medication contract.  Patient was informed that medication may cause drowsiness and should not be combined  with other medications/alcohol or street drugs. If the patient feels medication is causing altered alertness then do not drive or operate dangerous equipment.  Drug registry checked patient does not need Dilaudid currently tramadol refills given  7. Rheumatoid arthritis with rheumatoid factor of multiple sites without organ or systems involvement (HCC) Continue rheumatoid arthritis treatments via specialist

## 2021-01-04 ENCOUNTER — Telehealth: Payer: Self-pay | Admitting: *Deleted

## 2021-01-04 NOTE — Telephone Encounter (Signed)
BP: 128/78   

## 2021-01-04 NOTE — Telephone Encounter (Signed)
Patient requested blood pressure reading which was good.  She also states she is tolerating cholesterol medicine 2 days a week.  She will move to a new dosing of 3 days a week.  She will check cholesterol readings in several weeks and then follow-up with [place] later this summer

## 2021-02-17 ENCOUNTER — Encounter: Payer: Self-pay | Admitting: Family Medicine

## 2021-02-17 DIAGNOSIS — H401121 Primary open-angle glaucoma, left eye, mild stage: Secondary | ICD-10-CM | POA: Diagnosis not present

## 2021-02-17 DIAGNOSIS — H16223 Keratoconjunctivitis sicca, not specified as Sjogren's, bilateral: Secondary | ICD-10-CM | POA: Diagnosis not present

## 2021-02-17 DIAGNOSIS — H01001 Unspecified blepharitis right upper eyelid: Secondary | ICD-10-CM | POA: Diagnosis not present

## 2021-02-17 DIAGNOSIS — H01002 Unspecified blepharitis right lower eyelid: Secondary | ICD-10-CM | POA: Diagnosis not present

## 2021-02-17 LAB — HM DIABETES EYE EXAM

## 2021-02-21 ENCOUNTER — Encounter: Payer: Self-pay | Admitting: Family Medicine

## 2021-02-24 ENCOUNTER — Encounter: Payer: Self-pay | Admitting: Family Medicine

## 2021-03-01 ENCOUNTER — Encounter (INDEPENDENT_AMBULATORY_CARE_PROVIDER_SITE_OTHER): Payer: Self-pay | Admitting: *Deleted

## 2021-03-18 DIAGNOSIS — E038 Other specified hypothyroidism: Secondary | ICD-10-CM | POA: Diagnosis not present

## 2021-03-18 DIAGNOSIS — E559 Vitamin D deficiency, unspecified: Secondary | ICD-10-CM | POA: Diagnosis not present

## 2021-03-18 DIAGNOSIS — E782 Mixed hyperlipidemia: Secondary | ICD-10-CM | POA: Diagnosis not present

## 2021-03-19 LAB — LIPID PANEL
Chol/HDL Ratio: 2.2 ratio (ref 0.0–4.4)
Cholesterol, Total: 198 mg/dL (ref 100–199)
HDL: 90 mg/dL (ref >39)
LDL Chol Calc (NIH): 88 mg/dL (ref 0–99)
Triglycerides: 118 mg/dL (ref 0–149)
VLDL Cholesterol Cal: 20 mg/dL (ref 5–40)

## 2021-03-19 LAB — T4, FREE: Free T4: 1.64 ng/dL (ref 0.82–1.77)

## 2021-03-19 LAB — VITAMIN D 25 HYDROXY (VIT D DEFICIENCY, FRACTURES): Vit D, 25-Hydroxy: 45.2 ng/mL (ref 30.0–100.0)

## 2021-03-19 LAB — TSH: TSH: 1.17 u[IU]/mL (ref 0.450–4.500)

## 2021-03-21 ENCOUNTER — Telehealth: Payer: Self-pay | Admitting: *Deleted

## 2021-03-21 NOTE — Chronic Care Management (AMB) (Signed)
  Chronic Care Management   Note  03/21/2021 Name: NORENE OLIVERI MRN: [id number] DOB: November 17, 1945  EMYA PICADO is a 75 y.o. year old female who is a primary care patient of Luking, Elayne Snare, MD. I reached out to [name] by phone today in response to a referral sent by Ms. Arminda Resides Babilonia's PCP, Dr. Wolfgang Phoenix.      Ms. Dobratz was given information about Chronic Care Management services today including:  CCM service includes personalized support from designated clinical staff supervised by her physician, including individualized plan of care and coordination with other care providers 24/7 contact phone numbers for assistance for urgent and routine care needs. Service will only be billed when office clinical staff spend 20 minutes or more in a month to coordinate care. Only one practitioner may furnish and bill the service in a calendar month. The patient may stop CCM services at any time (effective at the end of the month) by phone call to the office staff. The patient will be responsible for cost sharing (co-pay) of up to 20% of the service fee (after annual deductible is met).  Patient agreed to services and verbal consent obtained.   Follow up plan: Face to Face appointment with care management team member scheduled for: 03/28/21  [facility]  Direct Dial: [phone]

## 2021-03-28 ENCOUNTER — Other Ambulatory Visit: Payer: Self-pay

## 2021-03-28 ENCOUNTER — Ambulatory Visit (INDEPENDENT_AMBULATORY_CARE_PROVIDER_SITE_OTHER): Payer: Medicare Other | Admitting: Pharmacist

## 2021-03-28 VITALS — BP 140/80 | HR 80 | Temp 97.9°F | Wt 154.6 lb

## 2021-03-28 DIAGNOSIS — M0579 Rheumatoid arthritis with rheumatoid factor of multiple sites without organ or systems involvement: Secondary | ICD-10-CM

## 2021-03-28 DIAGNOSIS — E038 Other specified hypothyroidism: Secondary | ICD-10-CM | POA: Diagnosis not present

## 2021-03-28 DIAGNOSIS — I10 Essential (primary) hypertension: Secondary | ICD-10-CM | POA: Diagnosis not present

## 2021-03-28 DIAGNOSIS — M8580 Other specified disorders of bone density and structure, unspecified site: Secondary | ICD-10-CM

## 2021-03-28 DIAGNOSIS — E782 Mixed hyperlipidemia: Secondary | ICD-10-CM

## 2021-03-28 NOTE — Chronic Care Management (AMB) (Signed)
Chronic Care Management Pharmacy Note  03/28/2021 Name:  Sabrina Adams MRN:  [id number] DOB:  02/21/1946  Summary:  Recommend home blood pressure monitoring, to bring results in next visit Recommend that the patient take blood pressure mediations at bedtime, as opposed to upon waking, since there is some evidence that this may improve blood pressure control (decrease in asleep blood pressure and increased sleep-time relative blood pressure decline, i.e. BP dipping) and may decrease occurrence of major cardiovascular events (See HYGIA Chronotherapy Trial)  Subjective: Sabrina Adams is an 75 y.o. year old female who is a primary patient of Luking, Elayne Snare, MD.  The CCM team was consulted for assistance with disease management and care coordination needs.    Engaged with patient face to face for initial visit in response to provider referral for pharmacy case management and/or care coordination services.   Consent to Services:  The patient was given the following information about Chronic Care Management services today, agreed to services, and gave verbal consent: 1. CCM service includes personalized support from designated clinical staff supervised by the primary care provider, including individualized plan of care and coordination with other care providers 2. 24/7 contact phone numbers for assistance for urgent and routine care needs. 3. Service will only be billed when office clinical staff spend 20 minutes or more in a month to coordinate care. 4. Only one practitioner may furnish and bill the service in a calendar month. 5.The patient may stop CCM services at any time (effective at the end of the month) by phone call to the office staff. 6. The patient will be responsible for cost sharing (co-pay) of up to 20% of the service fee (after annual deductible is met). Patient agreed to services and consent obtained.  Patient Care Team: Kathyrn Drown, MD as PCP - General (Family  Medicine) Beryle Lathe, [facility] (Pharmacist)  Objective:  Lab Results  Component Value Date   CREATININE 0.78 11/14/2019   CREATININE 0.73 06/05/2018   CREATININE 0.87 01/09/2018    Lab Results  Component Value Date   HGBA1C 5.9 (H) 11/16/2015   Last diabetic Eye exam:  Lab Results  Component Value Date/Time   HMDIABEYEEXA No Retinopathy 02/17/2021 12:00 AM    Last diabetic Foot exam: No results found for: HMDIABFOOTEX      Component Value Date/Time   CHOL 198 03/18/2021 1427   TRIG 118 03/18/2021 1427   HDL 90 03/18/2021 1427   CHOLHDL 2.2 03/18/2021 1427   CHOLHDL 3.3 09/29/2013 1248   VLDL 41 (H) 09/29/2013 1248   LDLCALC 88 03/18/2021 1427    Hepatic Function Latest Ref Rng & Units 11/14/2019 01/09/2018 12/23/2017  Total Protein 6.0 - 8.5 g/dL 6.6 7.2 5.3(L)  Albumin 3.7 - 4.7 g/dL 4.5 4.2 2.5(L)  AST 0 - 40 IU/L 35 33 145(H)  ALT 0 - 32 IU/L 19 22 71(H)  Alk Phosphatase 39 - 117 IU/L 72 111 116  Total Bilirubin 0.0 - 1.2 mg/dL 0.4 0.4 0.7  Bilirubin, Direct 0.00 - 0.40 mg/dL 0.12 0.11 -    Lab Results  Component Value Date/Time   TSH 1.170 03/18/2021 02:27 PM   TSH 2.620 06/22/2020 09:22 AM   FREET4 1.64 03/18/2021 02:27 PM    CBC Latest Ref Rng & Units 06/05/2018 01/09/2018 12/23/2017  WBC 3.4 - 10.8 x10E3/uL 4.1 5.9 4.2  Hemoglobin 11.1 - 15.9 g/dL 14.9 14.4 12.2  Hematocrit 34.0 - 46.6 % 44.2 42.8 37.4  Platelets 150 -  450 x10E3/uL 254 286 98(L)    Lab Results  Component Value Date/Time   VD25OH 45.2 03/18/2021 02:27 PM   VD25OH 43.8 11/14/2019 12:38 PM    Clinical ASCVD: No  The 10-year ASCVD risk score Mikey Bussing DC Jr., et al., 2013) is: 17.4%   Values used to calculate the score:     Age: 75 years     Sex: Female     Is Non-Hispanic African American: No     Diabetic: No     Tobacco smoker: No     Systolic Blood Pressure: 476 mmHg     Is BP treated: Yes     HDL Cholesterol: 90 mg/dL     Total Cholesterol: 198 mg/dL    Social History    Tobacco Use  Smoking Status Never  Smokeless Tobacco Never   BP Readings from Last 3 Encounters:  03/28/21 140/80  12/14/20 128/76  11/02/20 (!) 150/81   Pulse Readings from Last 3 Encounters:  03/28/21 80  12/14/20 70  11/02/20 63   Wt Readings from Last 3 Encounters:  03/28/21 154 lb 9.6 oz (70.1 kg)  12/14/20 157 lb (71.2 kg)  11/02/20 161 lb (73 kg)    Assessment: Review of patient past medical history, allergies, medications, health status, including review of consultants reports, laboratory and other test data, was performed as part of comprehensive evaluation and provision of chronic care management services.   SDOH:  (Social Determinants of Health) assessments and interventions performed:    CCM Care Plan  Allergies  Allergen Reactions   Betadine [Povidone Iodine] Itching    "Burns"   Cinnamon Anaphylaxis    Throat and Tongue    Codeine Nausea Only    Patient can become woozy   Other Nausea And Vomiting and Other (See Comments)    Pt states "all narcotic pain medications make me sick to my stomach and sometimes make me woozy   Actonel [Risedronate Sodium]     Stomach upset   Adhesive [Tape] Other (See Comments)    Irritates skin    Lidocaine     Causes vasoconstriction per patient   Methotrexate Derivatives     Elevated liver enzymes    Medications Reviewed Today     Reviewed by Beryle Lathe, [facility] (Pharmacist) on 03/28/21 at 1501  Med List Status: <None>   Medication Order Taking? Sig Documenting Provider Last Dose Status Informant  acetaminophen (TYLENOL) 650 MG CR tablet [id number] Yes Take 1,300 mg by mouth 2 (two) times daily. [provider] Taking Active Self           Med Note Sherren Mocha, TAMMY M   Tue Oct 29, 2018  1:45 PM) Patient takes 2 pills twice a day.  amLODipine (NORVASC) 2.5 MG tablet [id number] Yes Take 1 tablet (2.5 mg total) by mouth daily. Kathyrn Drown, MD Taking Active   b complex vitamins tablet [id number] Yes Take  1 tablet by mouth daily. [provider] Taking Active Self  Calcium Carbonate-Vitamin D (CALCIUM 600 + D PO) [id number] Yes Take 2 tablets by mouth daily. [provider] Taking Active Self  Cholecalciferol (VITAMIN D3) 50 MCG (2000 UT) capsule [id number] Yes Take 5,000 Units by mouth. Once a week [provider] Taking Active Self           Med Note Rhea Belton Mar 28, 2021  2:53 PM) On Fridays  docusate sodium (COLACE) 100 MG capsule [id number] Yes Take 200 mg by mouth  at bedtime. [provider] Taking Active Self           Med Note Grayland Ormond   Mon Apr 29, 2018  2:27 PM) Patient's husband states that patient takes 2 by mouth twice a DAY.  FIBER ADULT GUMMIES PO [id number] Yes Take by mouth. One bid [provider] Taking Active   gabapentin (NEURONTIN) 300 MG capsule [id number] Yes Take 1 capsule (300 mg total) by mouth 2 (two) times daily. 3rd dose if needed Kathyrn Drown, MD Taking Active   HYDROmorphone (DILAUDID) 2 MG tablet [id number] Yes One po three times daily as needed Kathyrn Drown, MD Taking Active            Med Note Grayland Ormond   Tue Nov 02, 2020  2:17 PM) Patient states that she seldom takes this medication.  hydroxychloroquine (PLAQUENIL) 200 MG tablet [id number] Yes Take 1 tablet (200 mg total) by mouth 2 (two) times daily. Kathyrn Drown, MD Taking Active Self  levothyroxine (SYNTHROID) 112 MCG tablet [id number] Yes Take 1 tablet (112 mcg total) by mouth daily. Kathyrn Drown, MD Taking Active   Lysine 500 MG TABS [id number] Yes Take 1 tablet by mouth daily as needed. [provider] Taking Active Self  Multiple Vitamin (MULTIVITAMIN WITH MINERALS) TABS [id number] Yes Take 1 tablet by mouth daily. [provider] Taking Active Self  ondansetron (ZOFRAN) 4 MG tablet [id number] No Take 4 mg by mouth every 8 (eight) hours as needed for nausea or vomiting.  Patient not taking: Reported on 03/28/2021    [provider] Not Taking Active   pantoprazole (PROTONIX) 40 MG tablet [id number] Yes Take 1 tablet (40 mg total) by mouth daily before breakfast. Kathyrn Drown, MD Taking Active   polyethylene glycol (MIRALAX / GLYCOLAX) packet [id number] Yes Take 17 g by mouth daily as needed for mild constipation. Constipation [provider] Taking Active Self           Med Note (TODD, TAMMY M   Tue Oct 29, 2018  1:50 PM) Every third day as needed.  pravastatin (PRAVACHOL) 20 MG tablet [id number] Yes Take 1 tablet (20 mg total) by mouth daily. Kathyrn Drown, MD Taking Active            Med Note Rhea Belton Mar 28, 2021  2:50 PM) Takes three times per week  predniSONE (DELTASONE) 2.5 MG tablet [id number] Yes Take 7.5 mg by mouth daily with breakfast. [provider] Taking Active Self  Probiotic Product (PROBIOTIC DAILY PO) [id number] Yes Take by mouth daily. [provider] Taking Active Self  promethazine (PHENERGAN) 25 MG tablet [id number] Yes Take 1 tablet by mouth twice a day as needed for nausea or vomiting  Patient taking differently: Take 12.5 mg by mouth at bedtime as needed.   Kathyrn Drown, MD Taking Active   Propylene Glycol (SYSTANE BALANCE OP) [id number] Yes Apply to eye 2 (two) times daily. Patient states that she uses 1 drop in each eye as needed [provider] Taking Active Self           Med Note Sherren Mocha, Ronnette Juniper   Tue Nov 02, 2020  2:21 PM) Patient states that she uses as needed during the day  timolol (TIMOPTIC) 0.5 % ophthalmic solution [id number] Yes Place 1 drop into both eyes daily. [provider] Taking Active Self           Med Note (  Grayland Ormond   Tue Nov 02, 2020  2:20 PM) Patient states that she is now using 1 drop in each eye daily.  traMADol (ULTRAM) 50 MG tablet [id number] Yes TAKE 1 TABLET BY MOUTH 4 TIMES AS NEEDED. Kathyrn Drown, MD Taking Active   Zoledronic Acid (RECLAST IV) [id number] Yes Inject into  the vein. yearly [provider] Taking Active Self           Med Note Grayland Ormond   Tue Nov 02, 2020  2:26 PM) 04/22/2020            Patient Active Problem List   Diagnosis Date Noted   History of colonic polyps 11/02/2020   Constipation 10/28/2019   LFTs abnormal 12/20/2017   Leukopenia 12/20/2017   Hyponatremia 12/20/2017   [place] spotted fever 12/20/2017   Osteopenia 02/16/2014   Hyperlipidemia 04/22/2013   Hypothyroidism 04/22/2013   GERD (gastroesophageal reflux disease) 01/01/2013   Rheumatoid arthritis ([place]) 12/20/2012   Essential hypertension, benign 12/20/2012   Chronic pain syndrome 12/20/2012    Immunization History  Administered Date(s) Administered   Fluad Quad(high Dose 65+) 05/17/2020   Influenza, Seasonal, Injecte, Preservative Fre 06/05/2012   Influenza,inj,Quad PF,6+ Mos 04/22/2013, 05/28/2014, 05/21/2015, 05/15/2016, 05/16/2017, 07/09/2018   Influenza-Unspecified 05/08/2019   Moderna Sars-Covid-2 Vaccination 09/18/2019, 10/17/2019, 07/19/2020   Pneumococcal Conjugate-13 02/16/2014   Pneumococcal Polysaccharide-23 01/04/2010, 11/15/2015   Tdap 06/16/2020   Zoster Recombinat (Shingrix) 08/28/2018, 02/20/2019    Conditions to be addressed/monitored: HTN and HLD  Care Plan : Medication Management  Updates made by Beryle Lathe, [place] since 03/28/2021 12:00 AM     Problem: Hypertension, Hyperlipidemia, Rheumatoid Arthritis, Osteopenia, and Hypothyroidism   Priority: High  Onset Date: 03/28/2021     Long-Range Goal: Disease Progression Prevention   Start Date: 03/28/2021  Expected End Date: 06/26/2021  This Visit's Progress: On track  Priority: High  Note:   Current Barriers:  Unable to achieve control of hypertension   Pharmacist Clinical Goal(s):  Over the next 90  days, patient will Achieve control of hypertension  as evidenced by improved blood pressure control through collaboration with PharmD and  provider.  Interventions: 1:1 collaboration with Kathyrn Drown, MD regarding development and update of comprehensive plan of care as evidenced by provider attestation and co-signature Inter-disciplinary care team collaboration (see longitudinal plan of care) Comprehensive medication review performed; medication list updated in electronic medical record  Hypertension: Current medications: amlodipine 2.5 mg by mouth once daily Intolerances: none Taking medications as directed: yes Side effects thought to be attributed to current medication regimen: no Denies dizziness, lightheadedness, blurred vision, and headache Current exercise: severely limited due to RA and prior right ankle surgery Home blood pressure readings: only checks about once per month. Not confident in home blood pressure cuff Blood pressure control is unclear. High today, but not monitoring outside of office. Blood pressure is above goal of <130/80 mmHg per 2017 AHA/ACC guidelines. Continue amlodipine 2.5 mg by mouth once daily Encourage dietary sodium restriction/DASH diet Recommend home blood pressure monitoring, to bring results in next visit Discussed need for medication compliance Recommend that the patient take blood pressure mediations at bedtime, as opposed to upon waking, since there is some evidence that this may improve blood pressure control (decrease in asleep blood pressure and increased sleep-time relative blood pressure decline, i.e. BP dipping) and may decrease occurrence of major cardiovascular events (See HYGIA Chronotherapy Trial) Reviewed risks of hypertension, principles of treatment and consequences of  untreated hypertension  Aortic Atherosclerosis/Hyperlipidemia: Current medications: pravastatin 20 mg by mouth three times per week Intolerances: reports she may be having some increased bowel movements and is unsure if related to medication Taking medications as directed: yes Side effects thought to be  attributed to current medication regimen: to be determined Controlled; LDL at goal of <100 due to high risk given at least 2 major CV risk factors (advancing age and hypertension) and 10-year risk 10-20% per 2020 AACE/ACE guidelines and TG at goal of <150 per 2020 AACE/ACE guidelines Continue pravastatin 20 mg by mouth three times per week Reviewed risks of hyperlipidemia, principles of treatment and consequences of untreated hyperlipidemia Discussed need for medication compliance Continue to monitor lipids and assess statin intolerance  Osteopenia: Current medications: calcium + vitamin D and zoledronic acid (Reclast) 5 mg IV every 12 months Intolerances: GI intolerance with risedronate Taking medications as directed: yes Side effects thought to be attributed to current medication regimen: no Last DEXA (10/09/19) Falls reported in last year: not discussed today Continue calcium + vitamin D and zoledronic acid (Reclast) 5 mg IV every 12 months. Since patient has been taking for several years, patient plans to discuss "bisphosphonate holiday" with Dr. Kathlene November at next visit. Could consider non-bisphosphonate treatment during "bisphosphonate holiday"  Recommend 1,000 mg of elemental calcium per day from diet or supplementation. Supplemental calcium not necessary if appropriate amount obtained through diet. Recommend 800 - 2,000 IU of vitamin D supplementation per day to maintain adequate vitamin D levels. Most recent vitamin D level within normal limits  Recommend walking, low impact aerobic exercise, or strength training  Rheumatoid Arthritis Current medications: hydroxychloroquine 200 mg by mouth twice daily and prednisone 7.5 mg by mouth daily  Continue management per Dr. Kathlene November  Hypothyroidism Current medication: levothyroxine 112 mcg by mouth daily TSH and Free T4 within normal limits   Patient Goals/Self-Care Activities Over the next 90  days, patient will:  Take medications as  prescribed Check blood pressure at least once daily, document, and provide at future appointments  Follow Up Plan: Telephone follow up appointment with care management team member scheduled for: 04/15/21 Next PCP appointment scheduled for: 04/18/21      Medication Assistance: None required.  Patient affirms current coverage meets needs.  Patient's preferred pharmacy is:  [facility] [place], [place] - [address] [place] [address] Phone: [phone] Fax: [phone]  [facility]) - [place], [address] [place] [address] Phone: [phone] Fax: [phone]  Follow Up:  Patient agrees to Care Plan and Follow-up.  Plan: Telephone follow up appointment with care management team member scheduled for:  04/15/21 and Next PCP appointment scheduled for: 04/18/21  Kennon Holter, PharmD Clinical Pharmacist [place] [phone]

## 2021-03-28 NOTE — Patient Instructions (Addendum)
Sabrina Adams,  It was great to talk to you today!  It can be hard choosing a Medicare plan. A group that I always recommend for my patients is called [facility]). With this program, they offer free counseling to Medicare beneficiaries and caregivers about Medicare, Medicare supplements, Medicare Advantage, Medicare Part D, and long-term care insurance. [place] counselors are not [employer], and they do not sell or endorse any product, plan, or company. The counselors are volunteers and they always offer unbiased information regarding Medicare health care products.  [facility] has counselors in every county across the state who are trained to be the [place] people for seniors and Medicare beneficiaries in their local communities. Local counseling is done by appointment in each county.   The [facility] local counseling team's information can be found below. I recommend that you give them a call and set up an appointment (Ask for [place]).    [facility]     [address] [place]  [address] [phone]   For more information: [email or web address] or just google "[place] [facility]"   Please call me with any questions or concerns.   Visit Information   PATIENT GOALS:   Goals Addressed             This Visit's Progress    Medication Management       Patient Goals/Self-Care Activities Over the next 90  days, patient will:  Take medications as prescribed Check blood pressure at least once daily, document, and provide at future appointments        Consent to CCM Services: Sabrina Adams was given information about Chronic Care Management services including:  CCM service includes personalized support from designated clinical  staff supervised by her physician, including individualized plan of care and coordination with other care providers 24/7 contact phone numbers for assistance for urgent and routine care needs. Service will only be billed when office clinical staff spend 20 minutes or more in a month to coordinate care. Only one practitioner may furnish and bill the service in a calendar month. The patient may stop CCM services at any time (effective at the end of the month) by phone call to the office staff. The patient will be responsible for cost sharing (co-pay) of up to 20% of the service fee (after annual deductible is met).  Patient agreed to services and verbal consent obtained.   Patient verbalizes understanding of instructions provided today and agrees to view in [place].   Telephone follow up appointment with care management team member scheduled for: Next PCP appointment scheduled for:   Kennon Holter, PharmD Clinical Pharmacist [place] [phone]  CLINICAL CARE PLAN: Patient Care Plan: Medication Management     Problem Identified: Hypertension, Hyperlipidemia, Rheumatoid Arthritis, Osteopenia, and Hypothyroidism   Priority: High  Onset Date: 03/28/2021     Long-Range Goal: Disease Progression Prevention   Start Date: 03/28/2021  Expected End Date: 06/26/2021  This Visit's Progress: On track  Priority: High  Note:   Current Barriers:  Unable to achieve control of hypertension   Pharmacist Clinical Goal(s):  Over the next 90  days, patient will Achieve control of hypertension  as evidenced by improved blood pressure control through collaboration with PharmD and provider.  Interventions: 1:1 collaboration with Kathyrn Drown, MD regarding development and update of comprehensive plan of care as evidenced by provider attestation and co-signature Inter-disciplinary care team collaboration (see longitudinal plan  of care) Comprehensive medication review  performed; medication list updated in electronic medical record  Hypertension: Current medications: amlodipine 2.5 mg by mouth once daily Intolerances: none Taking medications as directed: yes Side effects thought to be attributed to current medication regimen: no Denies dizziness, lightheadedness, blurred vision, and headache Current exercise: severely limited due to RA and prior right ankle surgery Home blood pressure readings: only checks about once per month. Not confident in home blood pressure cuff Blood pressure control is unclear. High today, but not monitoring outside of office. Blood pressure is above goal of <130/80 mmHg per 2017 AHA/ACC guidelines. Continue amlodipine 2.5 mg by mouth once daily Encourage dietary sodium restriction/DASH diet Recommend home blood pressure monitoring, to bring results in next visit Discussed need for medication compliance Recommend that the patient take blood pressure mediations at bedtime, as opposed to upon waking, since there is some evidence that this may improve blood pressure control (decrease in asleep blood pressure and increased sleep-time relative blood pressure decline, i.e. BP dipping) and may decrease occurrence of major cardiovascular events (See HYGIA Chronotherapy Trial) Reviewed risks of hypertension, principles of treatment and consequences of untreated hypertension  Aortic Atherosclerosis/Hyperlipidemia: Current medications: pravastatin 20 mg by mouth three times per week Intolerances: reports she may be having some increased bowel movements and is unsure if related to medication Taking medications as directed: yes Side effects thought to be attributed to current medication regimen: to be determined Controlled; LDL at goal of <100 due to high risk given at least 2 major CV risk factors (advancing age and hypertension) and 10-year risk 10-20% per 2020 AACE/ACE guidelines and TG at goal of <150 per 2020 AACE/ACE guidelines Continue  pravastatin 20 mg by mouth three times per week Reviewed risks of hyperlipidemia, principles of treatment and consequences of untreated hyperlipidemia Discussed need for medication compliance Continue to monitor lipids and assess statin intolerance  Osteopenia: Current medications: calcium + vitamin D and zoledronic acid (Reclast) 5 mg IV every 12 months Intolerances: GI intolerance with risedronate Taking medications as directed: yes Side effects thought to be attributed to current medication regimen: no Last DEXA (10/09/19) Falls reported in last year: not discussed today Continue calcium + vitamin D and zoledronic acid (Reclast) 5 mg IV every 12 months. Since patient has been taking for several years, patient plans to discuss "bisphosphonate holiday" with Dr. Kathlene November at next visit. Could consider non-bisphosphonate treatment during "bisphosphonate holiday"  Recommend 1,000 mg of elemental calcium per day from diet or supplementation. Supplemental calcium not necessary if appropriate amount obtained through diet. Recommend 800 - 2,000 IU of vitamin D supplementation per day to maintain adequate vitamin D levels. Most recent vitamin D level within normal limits  Recommend walking, low impact aerobic exercise, or strength training  Rheumatoid Arthritis Current medications: hydroxychloroquine 200 mg by mouth twice daily and prednisone 7.5 mg by mouth daily  Continue management per Dr. Kathlene November  Hypothyroidism Current medication: levothyroxine 112 mcg by mouth daily TSH and Free T4 within normal limits   Patient Goals/Self-Care Activities Over the next 90  days, patient will:  Take medications as prescribed Check blood pressure at least once daily, document, and provide at future appointments  Follow Up Plan: Telephone follow up appointment with care management team member scheduled for: 04/15/21 Next PCP appointment scheduled for: 04/18/21       The goal blood pressure is <130/80. This is  the best way to reduce the risk of long term complications of high blood pressure, such  as heart disease, heart disease, and kidney disease. Please check your blood pressure daily if possible.   Make sure you are sitting with your feet flat on the floor, resting, for at least 5 minutes before checking your blood pressure. We prefer an arm cuff instead of a wrist cuff; the wrist cuffs can be less accurate.

## 2021-03-30 ENCOUNTER — Other Ambulatory Visit: Payer: Self-pay

## 2021-03-30 MED ORDER — AMLODIPINE BESYLATE 2.5 MG PO TABS
2.5000 mg | ORAL_TABLET | Freq: Every day | ORAL | 1 refills | Status: DC
Start: 2021-03-30 — End: 2021-04-18

## 2021-03-30 MED ORDER — PANTOPRAZOLE SODIUM 40 MG PO TBEC
40.0000 mg | DELAYED_RELEASE_TABLET | Freq: Every day | ORAL | 1 refills | Status: DC
Start: 2021-03-30 — End: 2021-09-13

## 2021-04-06 ENCOUNTER — Ambulatory Visit (INDEPENDENT_AMBULATORY_CARE_PROVIDER_SITE_OTHER): Payer: Medicare Other | Admitting: Family Medicine

## 2021-04-06 ENCOUNTER — Other Ambulatory Visit: Payer: Self-pay

## 2021-04-06 VITALS — BP 148/76

## 2021-04-06 DIAGNOSIS — I10 Essential (primary) hypertension: Secondary | ICD-10-CM

## 2021-04-06 NOTE — Patient Instructions (Signed)
Take 2 of the amlodipine 2.5 daily That's 5 mg total We will recheck on the 12th of Sept

## 2021-04-06 NOTE — Progress Notes (Signed)
   Subjective:    Patient ID: Sabrina Adams, female    DOB: 08-03-1946, 75 y.o.   MRN: [id number]  HPI  Patient is concerned about her blood pressure Readings have been elevated lately We checked it with her unit and with our unit both readings indicate elevated blood pressure   Review of Systems     Objective:   Physical Exam        Assessment & Plan:  Elevated blood pressure 2 separate readings elevated Has higher readings at home Go ahead with amlodipine 2.5 mg currently taking Will now take 2 of these per day This equals 5 mg daily Follow-up in several weeks for recheck

## 2021-04-15 ENCOUNTER — Ambulatory Visit: Payer: Medicare Other

## 2021-04-18 ENCOUNTER — Ambulatory Visit (INDEPENDENT_AMBULATORY_CARE_PROVIDER_SITE_OTHER): Payer: Medicare Other | Admitting: Pharmacist

## 2021-04-18 ENCOUNTER — Ambulatory Visit (INDEPENDENT_AMBULATORY_CARE_PROVIDER_SITE_OTHER): Payer: Medicare Other | Admitting: Family Medicine

## 2021-04-18 ENCOUNTER — Other Ambulatory Visit: Payer: Self-pay

## 2021-04-18 DIAGNOSIS — E038 Other specified hypothyroidism: Secondary | ICD-10-CM

## 2021-04-18 DIAGNOSIS — I10 Essential (primary) hypertension: Secondary | ICD-10-CM

## 2021-04-18 DIAGNOSIS — M8580 Other specified disorders of bone density and structure, unspecified site: Secondary | ICD-10-CM

## 2021-04-18 DIAGNOSIS — E782 Mixed hyperlipidemia: Secondary | ICD-10-CM

## 2021-04-18 DIAGNOSIS — M0579 Rheumatoid arthritis with rheumatoid factor of multiple sites without organ or systems involvement: Secondary | ICD-10-CM

## 2021-04-18 MED ORDER — AMLODIPINE BESYLATE 5 MG PO TABS
5.0000 mg | ORAL_TABLET | Freq: Every day | ORAL | 1 refills | Status: DC
Start: 2021-04-18 — End: 2021-07-05

## 2021-04-18 NOTE — Chronic Care Management (AMB) (Signed)
Chronic Care Management Pharmacy Note  04/18/2021 Name:  Sabrina Adams MRN:  [id number] DOB:  February 29, 1946  Summary: Patient was instructed by PCP on 04/06/21 to take 2 tabs to increase to 5 mg per day. Patient did this for a few days then forgot and went back to 2.5 mg by mouth daily  PCP increased amlodipine to 5 mg mg by mouth once daily. Today, patient was instructed to take 2 tablets of the 2.5 mg dose until she runs out at which point she is to switch to one 5 mg tablet by mouth daily.   Recommendations/Changes made from today's visit: Consider addition of ARB such as olmesartan, telmisartan, or irbesartan as next line of therapy if additional blood pressure lowering is needed. Since patient has been taking for several years, patient plans to discuss "bisphosphonate holiday" with Dr. Kathlene November at next visit. Could consider non-bisphosphonate treatment during "bisphosphonate holiday"   Subjective: Sabrina Adams is an 75 y.o. year old female who is a primary patient of Luking, Elayne Snare, MD.  The CCM team was consulted for assistance with disease management and care coordination needs.    Engaged with patient face to face for follow up visit in response to provider referral for pharmacy case management and/or care coordination services.   Consent to Services:  The patient was given information about Chronic Care Management services, agreed to services, and gave verbal consent prior to initiation of services.  Please see initial visit note for detailed documentation.   Patient Care Team: Kathyrn Drown, MD as PCP - General (Family Medicine) Beryle Lathe, [facility] (Pharmacist)  Objective:  Lab Results  Component Value Date   CREATININE 0.78 11/14/2019   CREATININE 0.73 06/05/2018   CREATININE 0.87 01/09/2018    Lab Results  Component Value Date   HGBA1C 5.9 (H) 11/16/2015   Last diabetic Eye exam:  Lab Results  Component Value Date/Time   HMDIABEYEEXA No  Retinopathy 02/17/2021 12:00 AM    Last diabetic Foot exam: No results found for: HMDIABFOOTEX      Component Value Date/Time   CHOL 198 03/18/2021 1427   TRIG 118 03/18/2021 1427   HDL 90 03/18/2021 1427   CHOLHDL 2.2 03/18/2021 1427   CHOLHDL 3.3 09/29/2013 1248   VLDL 41 (H) 09/29/2013 1248   LDLCALC 88 03/18/2021 1427    Hepatic Function Latest Ref Rng & Units 11/14/2019 01/09/2018 12/23/2017  Total Protein 6.0 - 8.5 g/dL 6.6 7.2 5.3(L)  Albumin 3.7 - 4.7 g/dL 4.5 4.2 2.5(L)  AST 0 - 40 IU/L 35 33 145(H)  ALT 0 - 32 IU/L 19 22 71(H)  Alk Phosphatase 39 - 117 IU/L 72 111 116  Total Bilirubin 0.0 - 1.2 mg/dL 0.4 0.4 0.7  Bilirubin, Direct 0.00 - 0.40 mg/dL 0.12 0.11 -    Lab Results  Component Value Date/Time   TSH 1.170 03/18/2021 02:27 PM   TSH 2.620 06/22/2020 09:22 AM   FREET4 1.64 03/18/2021 02:27 PM    CBC Latest Ref Rng & Units 06/05/2018 01/09/2018 12/23/2017  WBC 3.4 - 10.8 x10E3/uL 4.1 5.9 4.2  Hemoglobin 11.1 - 15.9 g/dL 14.9 14.4 12.2  Hematocrit 34.0 - 46.6 % 44.2 42.8 37.4  Platelets 150 - 450 x10E3/uL 254 286 98(L)    Lab Results  Component Value Date/Time   VD25OH 45.2 03/18/2021 02:27 PM   VD25OH 43.8 11/14/2019 12:38 PM    Clinical ASCVD: No  The 10-year ASCVD risk score (Arnett DK, et al.,  2019) is: 21.7%   Values used to calculate the score:     Age: 75 years     Sex: Female     Is Non-Hispanic African American: No     Diabetic: No     Tobacco smoker: No     Systolic Blood Pressure: 166 mmHg     Is BP treated: Yes     HDL Cholesterol: 90 mg/dL     Total Cholesterol: 198 mg/dL    Social History   Tobacco Use  Smoking Status Never  Smokeless Tobacco Never   BP Readings from Last 3 Encounters:  04/06/21 (!) 148/76  03/28/21 140/80  12/14/20 128/76   Pulse Readings from Last 3 Encounters:  03/28/21 80  12/14/20 70  11/02/20 63   Wt Readings from Last 3 Encounters:  03/28/21 154 lb 9.6 oz (70.1 kg)  12/14/20 157 lb (71.2 kg)   11/02/20 161 lb (73 kg)    Assessment: Review of patient past medical history, allergies, medications, health status, including review of consultants reports, laboratory and other test data, was performed as part of comprehensive evaluation and provision of chronic care management services.   SDOH:  (Social Determinants of Health) assessments and interventions performed:    CCM Care Plan  Allergies  Allergen Reactions   Betadine [Povidone Iodine] Itching    "Burns"   Cinnamon Anaphylaxis    Throat and Tongue    Codeine Nausea Only    Patient can become woozy   Other Nausea And Vomiting and Other (See Comments)    Pt states "all narcotic pain medications make me sick to my stomach and sometimes make me woozy   Actonel [Risedronate Sodium]     Stomach upset   Adhesive [Tape] Other (See Comments)    Irritates skin    Lidocaine     Causes vasoconstriction per patient   Methotrexate Derivatives     Elevated liver enzymes    Medications Reviewed Today     Reviewed by Beryle Lathe, [facility] (Pharmacist) on 04/18/21 at 1445  Med List Status: <None>   Medication Order Taking? Sig Documenting Provider Last Dose Status Informant  acetaminophen (TYLENOL) 650 MG CR tablet [id number] Yes Take 1,300 mg by mouth 2 (two) times daily. [provider] Taking Active Self           Med Note Sherren Mocha, TAMMY M   Tue Oct 29, 2018  1:45 PM) Patient takes 2 pills twice a day.  amLODipine (NORVASC) 5 MG tablet [id number]  Take 1 tablet (5 mg total) by mouth daily. Kathyrn Drown, MD  Active   b complex vitamins tablet [id number] Yes Take 1 tablet by mouth daily. [provider] Taking Active Self  Calcium Carbonate-Vitamin D (CALCIUM 600 + D PO) [id number] Yes Take 2 tablets by mouth daily. [provider] Taking Active Self  Cholecalciferol (VITAMIN D3) 50 MCG (2000 UT) capsule [id number] Yes Take 5,000 Units by mouth. Once a week [provider] Taking Active Self            Med Note Rhea Belton Mar 28, 2021  2:53 PM) On Fridays  docusate sodium (COLACE) 100 MG capsule [id number] Yes Take 200 mg by mouth at bedtime. [provider] Taking Active Self           Med Note Grayland Ormond   Mon Apr 29, 2018  2:27 PM) Patient's husband states that patient takes 2 by mouth twice a DAY.  FIBER ADULT GUMMIES PO [id number] Yes Take by mouth. One bid [provider] Taking Active   gabapentin (NEURONTIN) 300 MG capsule [id number] Yes Take 1 capsule (300 mg total) by mouth 2 (two) times daily. 3rd dose if needed Kathyrn Drown, MD Taking Active   HYDROmorphone (DILAUDID) 2 MG tablet [id number] Yes One po three times daily as needed Kathyrn Drown, MD Taking Active            Med Note Grayland Ormond   Tue Nov 02, 2020  2:17 PM) Patient states that she seldom takes this medication.  hydroxychloroquine (PLAQUENIL) 200 MG tablet [id number] Yes Take 1 tablet (200 mg total) by mouth 2 (two) times daily. Kathyrn Drown, MD Taking Active Self  levothyroxine (SYNTHROID) 112 MCG tablet [id number] Yes Take 1 tablet (112 mcg total) by mouth daily. Kathyrn Drown, MD Taking Active   Lysine 500 MG TABS [id number] Yes Take 1 tablet by mouth daily as needed. [provider] Taking Active Self  Multiple Vitamin (MULTIVITAMIN WITH MINERALS) TABS [id number] Yes Take 1 tablet by mouth daily. [provider] Taking Active Self  ondansetron (ZOFRAN) 4 MG tablet [id number] Yes Take 4 mg by mouth every 8 (eight) hours as needed for nausea or vomiting. [provider] Taking Active   pantoprazole (PROTONIX) 40 MG tablet [id number] Yes Take 1 tablet (40 mg total) by mouth daily before breakfast. Kathyrn Drown, MD Taking Active   polyethylene glycol (MIRALAX / GLYCOLAX) packet [id number] Yes Take 17 g by mouth daily as needed for mild constipation. Constipation [provider] Taking Active Self           Med Note (TODD, TAMMY M   Tue  Oct 29, 2018  1:50 PM) Every third day as needed.  pravastatin (PRAVACHOL) 20 MG tablet [id number] Yes Take 1 tablet (20 mg total) by mouth daily. Kathyrn Drown, MD Taking Active            Med Note Rhea Belton Mar 28, 2021  2:50 PM) Takes three times per week  predniSONE (DELTASONE) 2.5 MG tablet [id number] Yes Take 7.5 mg by mouth daily with breakfast. [provider] Taking Active Self  Probiotic Product (PROBIOTIC DAILY PO) [id number] Yes Take by mouth daily. [provider] Taking Active Self  promethazine (PHENERGAN) 25 MG tablet [id number] Yes Take 1 tablet by mouth twice a day as needed for nausea or vomiting  Patient taking differently: Take 12.5 mg by mouth at bedtime as needed.   Kathyrn Drown, MD Taking Active   Propylene Glycol (SYSTANE BALANCE OP) [id number] Yes Apply to eye 2 (two) times daily. Patient states that she uses 1 drop in each eye as needed [provider] Taking Active Self           Med Note Sherren Mocha, Ronnette Juniper   Tue Nov 02, 2020  2:21 PM) Patient states that she uses as needed during the day  timolol (TIMOPTIC) 0.5 % ophthalmic solution [id number] Yes Place 1 drop into both eyes daily. [provider] Taking Active Self           Med Note Sherren Mocha, Ronnette Juniper   Tue Nov 02, 2020  2:20 PM) Patient states that she is now using 1 drop in each eye daily.  traMADol (ULTRAM) 50 MG tablet [id number] Yes TAKE 1 TABLET BY MOUTH 4 TIMES AS NEEDED. Kathyrn Drown, MD Taking Active   Zoledronic Acid (RECLAST IV) [id number]  Yes Inject into the vein. yearly [provider] Taking Active Self           Med Note Grayland Ormond   Tue Nov 02, 2020  2:26 PM) 04/22/2020            Patient Active Problem List   Diagnosis Date Noted   History of colonic polyps 11/02/2020   Constipation 10/28/2019   LFTs abnormal 12/20/2017   Leukopenia 12/20/2017   Hyponatremia 12/20/2017   [place] spotted fever 12/20/2017   Osteopenia  02/16/2014   Hyperlipidemia 04/22/2013   Hypothyroidism 04/22/2013   GERD (gastroesophageal reflux disease) 01/01/2013   Rheumatoid arthritis ([place]) 12/20/2012   Essential hypertension, benign 12/20/2012   Chronic pain syndrome 12/20/2012    Immunization History  Administered Date(s) Administered   Fluad Quad(high Dose 65+) 05/17/2020   Influenza, Seasonal, Injecte, Preservative Fre 06/05/2012   Influenza,inj,Quad PF,6+ Mos 04/22/2013, 05/28/2014, 05/21/2015, 05/15/2016, 05/16/2017, 07/09/2018   Influenza-Unspecified 05/08/2019   Moderna Sars-Covid-2 Vaccination 09/18/2019, 10/17/2019, 07/19/2020   Pneumococcal Conjugate-13 02/16/2014   Pneumococcal Polysaccharide-23 01/04/2010, 11/15/2015   Tdap 06/16/2020   Zoster Recombinat (Shingrix) 08/28/2018, 02/20/2019    Conditions to be addressed/monitored: HTN, HLD,RA, osteopenia, and hypothyroidism  Care Plan : Medication Management  Updates made by Beryle Lathe, [place] since 04/18/2021 12:00 AM     Problem: Hypertension, Hyperlipidemia, Rheumatoid Arthritis, Osteopenia, and Hypothyroidism   Priority: High  Onset Date: 03/28/2021     Long-Range Goal: Disease Progression Prevention   Start Date: 03/28/2021  Expected End Date: 06/26/2021  Recent Progress: On track  Priority: High  Note:   Current Barriers:  Unable to achieve control of hypertension   Pharmacist Clinical Goal(s):  Over the next 90 days, patient will ahieve control of hypertension as evidenced by improved blood pressure control through collaboration with PharmD and provider.  Interventions: 1:1 collaboration with Kathyrn Drown, MD regarding development and update of comprehensive plan of care as evidenced by provider attestation and co-signature Inter-disciplinary care team collaboration (see longitudinal plan of care) Comprehensive medication review performed; medication list updated in electronic medical record  Hypertension: Current medications:  amlodipine 2.5 mg by mouth once daily Intolerances: none Taking medications as directed: no, patient was instructed by PCP to take 2 tabs to increase to 5 mg per day. Patient did this for a few days then forgot and went back to 2.5 mg by mouth daily  Side effects thought to be attributed to current medication regimen: no Denies dizziness, lightheadedness, blurred vision, and headache Current exercise: severely limited due to RA and prior right ankle surgery Home blood pressure readings: still does not check frequently Blood pressure control is unclear. Blood pressure continues to be high in clinic, but patient not consistently monitoring outside of office. Blood pressure is above goal of <130/80 mmHg per 2017 AHA/ACC guidelines. PCP increased amlodipine to 5 mg mg by mouth once daily. Patient was instructed to take 2 tablets of the 2.5 mg dose until she runs out at which point she is to switch to one 5 mg tablet by mouth daily  Encourage dietary sodium restriction/DASH diet Recommend home blood pressure monitoring, to bring results in next visit Discussed need for medication compliance Recommend that the patient take blood pressure mediations at bedtime, as opposed to upon waking, since there is some evidence that this may improve blood pressure control (decrease in asleep blood pressure and increased sleep-time relative blood pressure decline, i.e. BP dipping) and may decrease occurrence of major cardiovascular events (  See HYGIA Chronotherapy Trial) Reviewed risks of hypertension, principles of treatment and consequences of untreated hypertension Consider addition of ARB such as olmesartan, telmisartan, or irbesartan as next line of therapy  Aortic Atherosclerosis/Hyperlipidemia: Current medications: pravastatin 20 mg by mouth three times per week (Tuesday, Thursday, and Saturday) Intolerances: none at this time Taking medications as directed: yes Side effects thought to be attributed to current  medication regimen: no Controlled; LDL at goal of <100 due to high risk given at least 2 major CV risk factors (advancing age and hypertension) and 10-year risk 10-20% per 2020 AACE/ACE guidelines and TG at goal of <150 per 2020 AACE/ACE guidelines Continue pravastatin 20 mg by mouth three times per week (Tuesday, Thursday, and Saturday) Reviewed risks of hyperlipidemia, principles of treatment and consequences of untreated hyperlipidemia Discussed need for medication compliance Continue to monitor lipids and assess statin intolerance  Osteopenia: Current medications: calcium + vitamin D and zoledronic acid (Reclast) 5 mg IV every 12 months Intolerances: GI intolerance with risedronate Taking medications as directed: yes Side effects thought to be attributed to current medication regimen: no Last DEXA (10/09/19) Falls reported in last year: not discussed today Continue calcium + vitamin D and zoledronic acid (Reclast) 5 mg IV every 12 months. Since patient has been taking for several years, patient plans to discuss "bisphosphonate holiday" with Dr. Kathlene November at next visit. Could consider non-bisphosphonate treatment during "bisphosphonate holiday"  Recommend 1,000 mg of elemental calcium per day from diet or supplementation. Supplemental calcium not necessary if appropriate amount obtained through diet. Recommend 800 - 2,000 IU of vitamin D supplementation per day to maintain adequate vitamin D levels. Most recent vitamin D level within normal limits  Recommend walking, low impact aerobic exercise, or strength training  Rheumatoid Arthritis Current medications: hydroxychloroquine 200 mg by mouth twice daily and prednisone 7.5 mg by mouth daily  Continue management per Dr. Kathlene November  Hypothyroidism Current medication: levothyroxine 112 mcg by mouth daily TSH and Free T4 within normal limits   Patient Goals/Self-Care Activities Over the next 90  days, patient will:  Take medications as  prescribed Check blood pressure at least once daily, document, and provide at future appointments  Follow Up Plan: Telephone follow up appointment with care management team member scheduled for: 05/23/21      Medication Assistance: None required.  Patient affirms current coverage meets needs.  Patient's preferred pharmacy is:  [facility] [place], [place] - [address] [place] [address] Phone: [phone] Fax: [phone]  [facility]) - [place], [address] [place] [address] Phone: [phone] Fax: [phone]  Follow Up:  Patient agrees to Care Plan and Follow-up.  Plan: Face to Face appointment with care management team member scheduled for: 05/23/21  Kennon Holter, PharmD Clinical Pharmacist [place] [phone]

## 2021-04-18 NOTE — Patient Instructions (Signed)

## 2021-04-18 NOTE — Patient Instructions (Signed)
Sabrina Adams,  It was great to talk to you today!  Please call me with any questions or concerns.   It can be hard choosing a Medicare plan. A group that I always recommend for my patients is called [facility] [employer] [facility]). With this program, they offer free counseling to Medicare beneficiaries and caregivers about Medicare, Medicare supplements, Medicare Advantage, Medicare Part D, and long-term care insurance. [place] counselors are not [employer], and they do not sell or endorse any product, plan, or company. The counselors are volunteers and they always offer unbiased information regarding Medicare health care products.  [facility] has counselors in every county across the state who are trained to be the [place] people for seniors and Medicare beneficiaries in their local communities. Local counseling is done by appointment in each county.   The [facility] local counseling team's information can be found below. I recommend that you give them a call and set up an appointment (Ask for [place]).    [facility]     [address] [place]  [id number] [phone]   For more information: [email or web address] or just google "[place] [facility]"   Visit Information  PATIENT GOALS:  Goals Addressed             This Visit's Progress    Medication Management       Patient Goals/Self-Care Activities Over the next 90  days, patient will:  Take medications as prescribed Check blood pressure at least once daily, document, and provide at future appointments          Patient verbalizes understanding of instructions provided today and agrees to view in [place].   Face to Face appointment with care management team member scheduled for:  05/23/21  Kennon Holter, PharmD Clinical Pharmacist [place] [phone]

## 2021-04-18 NOTE — Progress Notes (Signed)
   Subjective:    Patient ID: Sabrina Adams, female    DOB: [birth year], 75 y.o.   MRN: [id number]  Hypertension This is a chronic problem. Treatments tried: amlodipine 2.5 mg 1 daily.  States forgot to increase dose from last visit  Therefore the blood pressure is elevated today.  She is trying to watch how she eats trying to stay active Denies chest pain shortness of breath   Review of Systems     Objective:   Physical Exam  General-in no acute distress Eyes-no discharge Lungs-respiratory rate normal, CTA CV-no murmurs,RRR Extremities skin warm dry no edema Neuro grossly normal Behavior normal, alert       Assessment & Plan:   HTN Not taking the proper dose To taking amlodipine 5 mg daily Recommend following up within 2 to 3 weeks to recheck blood pressure Healthy eating continue Follow-up if any problems

## 2021-04-19 DIAGNOSIS — M810 Age-related osteoporosis without current pathological fracture: Secondary | ICD-10-CM | POA: Diagnosis not present

## 2021-04-19 DIAGNOSIS — M2550 Pain in unspecified joint: Secondary | ICD-10-CM | POA: Diagnosis not present

## 2021-04-19 DIAGNOSIS — M25439 Effusion, unspecified wrist: Secondary | ICD-10-CM | POA: Diagnosis not present

## 2021-04-19 DIAGNOSIS — M1990 Unspecified osteoarthritis, unspecified site: Secondary | ICD-10-CM | POA: Diagnosis not present

## 2021-04-19 DIAGNOSIS — M79643 Pain in unspecified hand: Secondary | ICD-10-CM | POA: Diagnosis not present

## 2021-04-19 DIAGNOSIS — Z7952 Long term (current) use of systemic steroids: Secondary | ICD-10-CM | POA: Diagnosis not present

## 2021-04-19 DIAGNOSIS — M549 Dorsalgia, unspecified: Secondary | ICD-10-CM | POA: Diagnosis not present

## 2021-04-19 DIAGNOSIS — M0579 Rheumatoid arthritis with rheumatoid factor of multiple sites without organ or systems involvement: Secondary | ICD-10-CM | POA: Diagnosis not present

## 2021-04-26 DIAGNOSIS — M0579 Rheumatoid arthritis with rheumatoid factor of multiple sites without organ or systems involvement: Secondary | ICD-10-CM | POA: Diagnosis not present

## 2021-05-04 ENCOUNTER — Other Ambulatory Visit (INDEPENDENT_AMBULATORY_CARE_PROVIDER_SITE_OTHER): Payer: Medicare Other | Admitting: *Deleted

## 2021-05-04 ENCOUNTER — Other Ambulatory Visit: Payer: Self-pay

## 2021-05-04 DIAGNOSIS — Z23 Encounter for immunization: Secondary | ICD-10-CM

## 2021-05-06 DIAGNOSIS — I10 Essential (primary) hypertension: Secondary | ICD-10-CM | POA: Diagnosis not present

## 2021-05-06 DIAGNOSIS — E782 Mixed hyperlipidemia: Secondary | ICD-10-CM

## 2021-05-06 DIAGNOSIS — M0579 Rheumatoid arthritis with rheumatoid factor of multiple sites without organ or systems involvement: Secondary | ICD-10-CM | POA: Diagnosis not present

## 2021-05-06 DIAGNOSIS — E038 Other specified hypothyroidism: Secondary | ICD-10-CM

## 2021-05-10 ENCOUNTER — Other Ambulatory Visit (HOSPITAL_COMMUNITY): Payer: Self-pay | Admitting: Family Medicine

## 2021-05-10 DIAGNOSIS — Z1231 Encounter for screening mammogram for malignant neoplasm of breast: Secondary | ICD-10-CM

## 2021-05-16 ENCOUNTER — Encounter: Payer: Self-pay | Admitting: Family Medicine

## 2021-05-16 ENCOUNTER — Other Ambulatory Visit: Payer: Self-pay

## 2021-05-16 ENCOUNTER — Ambulatory Visit (INDEPENDENT_AMBULATORY_CARE_PROVIDER_SITE_OTHER): Payer: Medicare Other | Admitting: Family Medicine

## 2021-05-16 VITALS — BP 142/78 | HR 72 | Temp 96.6°F | Wt 154.0 lb

## 2021-05-16 DIAGNOSIS — Z86010 Personal history of colonic polyps: Secondary | ICD-10-CM

## 2021-05-16 DIAGNOSIS — Z1211 Encounter for screening for malignant neoplasm of colon: Secondary | ICD-10-CM

## 2021-05-16 DIAGNOSIS — I10 Essential (primary) hypertension: Secondary | ICD-10-CM | POA: Diagnosis not present

## 2021-05-16 NOTE — Progress Notes (Signed)
   Subjective:    Patient ID: Sabrina Adams, female    DOB: [birth year], 75 y.o.   MRN: [id number]  HPI Pt here to have blood pressure rechecked. Pt states last week was very stressful due to organizing a picnic for 200 people. No symptoms related to blood pressure. Pt is checking BP occasionally at home.  Essential hypertension, benign  History of colonic polyps - Plan: Ambulatory referral to Gastroenterology  Encounter for screening colonoscopy - Plan: Ambulatory referral to Gastroenterology   Review of Systems     Objective:   Physical Exam  General-in no acute distress Eyes-no discharge Lungs-respiratory rate normal, CTA CV-no murmurs,RRR Extremities skin warm dry no edema Neuro grossly normal Behavior normal, alert       Assessment & Plan:  Colonoscopy screening send referral to [place]  Also blood pressure decent control but it drops significantly when she stands therefore I do not want to stop there her dose currently stick with current regimen  Follow-up in 4 weeks to see how this is doing  Annual wellness visit in 4 weeks  Referral for a biotech for orthotic for her right lower leg  Family having a hard time affording the insurance they were looking into less expensive Medicare products

## 2021-05-19 ENCOUNTER — Encounter (INDEPENDENT_AMBULATORY_CARE_PROVIDER_SITE_OTHER): Payer: Self-pay | Admitting: *Deleted

## 2021-05-23 ENCOUNTER — Other Ambulatory Visit: Payer: Self-pay

## 2021-05-23 ENCOUNTER — Ambulatory Visit (INDEPENDENT_AMBULATORY_CARE_PROVIDER_SITE_OTHER): Payer: Medicare Other | Admitting: Pharmacist

## 2021-05-23 VITALS — BP 151/81 | HR 74

## 2021-05-23 DIAGNOSIS — I10 Essential (primary) hypertension: Secondary | ICD-10-CM

## 2021-05-23 DIAGNOSIS — E782 Mixed hyperlipidemia: Secondary | ICD-10-CM

## 2021-05-23 DIAGNOSIS — E038 Other specified hypothyroidism: Secondary | ICD-10-CM

## 2021-05-23 DIAGNOSIS — M0579 Rheumatoid arthritis with rheumatoid factor of multiple sites without organ or systems involvement: Secondary | ICD-10-CM

## 2021-05-23 DIAGNOSIS — M8580 Other specified disorders of bone density and structure, unspecified site: Secondary | ICD-10-CM

## 2021-05-23 MED ORDER — OLMESARTAN MEDOXOMIL 5 MG PO TABS: 5.0000 mg | ORAL_TABLET | Freq: Every day | ORAL | 2 refills | Status: DC

## 2021-05-23 NOTE — Chronic Care Management (AMB) (Signed)
Chronic Care Management Pharmacy Note  05/23/2021 Name:  Sabrina Adams MRN:  [id number] DOB:  05-27-1946  Summary: Hypertension: Continue amlodipine 5 mg by mouth daily SBP elevated above goal at last 2 visits. Discussed with PCP and will add olmesartan 5 mg by mouth daily. Check BMP in 2 weeks. May need to titrate further but will start conservatively to reduce likelihood of falls/hypotension. Plan to eventually combine with amlodipine as (Azor) once on stable dose. Recommend home blood pressure monitoring, to bring results in next visit  Osteopenia (Managed by [facility] Patient reports that her last dose of Reclast will be the last she receives in a few years. Plan to take a bisphosphonate holiday per Dr. Kathlene November. Continue to monitor bone density every couple years. Continue calcium + vitamin D and zoledronic acid (Reclast) per instructions by Dr. Kathlene November.   Subjective: Sabrina Adams is an 75 y.o. year old female who is a primary patient of Luking, Elayne Snare, MD.  The CCM team was consulted for assistance with disease management and care coordination needs.    Engaged with patient face to face for follow up visit in response to provider referral for pharmacy case management and/or care coordination services.   Consent to Services:  The patient was given information about Chronic Care Management services, agreed to services, and gave verbal consent prior to initiation of services.  Please see initial visit note for detailed documentation.   Patient Care Team: Kathyrn Drown, MD as PCP - General (Family Medicine) Beryle Lathe, [facility] (Pharmacist)  Objective:  Lab Results  Component Value Date   CREATININE 0.78 11/14/2019   CREATININE 0.73 06/05/2018   CREATININE 0.87 01/09/2018    Lab Results  Component Value Date   HGBA1C 5.9 (H) 11/16/2015   Last diabetic Eye exam:  Lab Results  Component Value Date/Time   HMDIABEYEEXA No Retinopathy 02/17/2021 12:00 AM     Last diabetic Foot exam: No results found for: HMDIABFOOTEX      Component Value Date/Time   CHOL 198 03/18/2021 1427   TRIG 118 03/18/2021 1427   HDL 90 03/18/2021 1427   CHOLHDL 2.2 03/18/2021 1427   CHOLHDL 3.3 09/29/2013 1248   VLDL 41 (H) 09/29/2013 1248   LDLCALC 88 03/18/2021 1427    Hepatic Function Latest Ref Rng & Units 11/14/2019 01/09/2018 12/23/2017  Total Protein 6.0 - 8.5 g/dL 6.6 7.2 5.3(L)  Albumin 3.7 - 4.7 g/dL 4.5 4.2 2.5(L)  AST 0 - 40 IU/L 35 33 145(H)  ALT 0 - 32 IU/L 19 22 71(H)  Alk Phosphatase 39 - 117 IU/L 72 111 116  Total Bilirubin 0.0 - 1.2 mg/dL 0.4 0.4 0.7  Bilirubin, Direct 0.00 - 0.40 mg/dL 0.12 0.11 -    Lab Results  Component Value Date/Time   TSH 1.170 03/18/2021 02:27 PM   TSH 2.620 06/22/2020 09:22 AM   FREET4 1.64 03/18/2021 02:27 PM    CBC Latest Ref Rng & Units 06/05/2018 01/09/2018 12/23/2017  WBC 3.4 - 10.8 x10E3/uL 4.1 5.9 4.2  Hemoglobin 11.1 - 15.9 g/dL 14.9 14.4 12.2  Hematocrit 34.0 - 46.6 % 44.2 42.8 37.4  Platelets 150 - 450 x10E3/uL 254 286 98(L)    Lab Results  Component Value Date/Time   VD25OH 45.2 03/18/2021 02:27 PM   VD25OH 43.8 11/14/2019 12:38 PM    Clinical ASCVD: No  The 10-year ASCVD risk score (Arnett DK, et al., 2019) is: 22.4%   Values used to calculate the score:  Age: 75 years     Sex: Female     Is Non-Hispanic African American: No     Diabetic: No     Tobacco smoker: No     Systolic Blood Pressure: 592 mmHg     Is BP treated: Yes     HDL Cholesterol: 90 mg/dL     Total Cholesterol: 198 mg/dL    Social History   Tobacco Use  Smoking Status Never  Smokeless Tobacco Never   BP Readings from Last 3 Encounters:  05/23/21 (!) 151/81  05/16/21 (!) 142/78  04/06/21 (!) 148/76   Pulse Readings from Last 3 Encounters:  05/23/21 74  05/16/21 72  03/28/21 80   Wt Readings from Last 3 Encounters:  05/16/21 154 lb (69.9 kg)  03/28/21 154 lb 9.6 oz (70.1 kg)  12/14/20 157 lb (71.2 kg)     Assessment: Review of patient past medical history, allergies, medications, health status, including review of consultants reports, laboratory and other test data, was performed as part of comprehensive evaluation and provision of chronic care management services.   SDOH:  (Social Determinants of Health) assessments and interventions performed:    CCM Care Plan  Allergies  Allergen Reactions   Betadine [Povidone Iodine] Itching    "Burns"   Cinnamon Anaphylaxis    Throat and Tongue    Codeine Nausea Only    Patient can become woozy   Other Nausea And Vomiting and Other (See Comments)    Pt states "all narcotic pain medications make me sick to my stomach and sometimes make me woozy   Actonel [Risedronate Sodium]     Stomach upset   Adhesive [Tape] Other (See Comments)    Irritates skin    Lidocaine     Causes vasoconstriction per patient   Methotrexate Derivatives     Elevated liver enzymes    Medications Reviewed Today     Reviewed by Beryle Lathe, [facility] (Pharmacist) on 05/23/21 at [place] List Status: <None>   Medication Order Taking? Sig Documenting Provider Last Dose Status Informant  acetaminophen (TYLENOL) 650 MG CR tablet [id number] Yes Take 1,300 mg by mouth 2 (two) times daily. [provider] Taking Active Self           Med Note Sherren Mocha, TAMMY M   Tue Oct 29, 2018  1:45 PM) Patient takes 2 pills twice a day.  amLODipine (NORVASC) 5 MG tablet [id number] Yes Take 1 tablet (5 mg total) by mouth daily. Kathyrn Drown, MD Taking Active   b complex vitamins tablet [id number] Yes Take 1 tablet by mouth daily. [provider] Taking Active Self  Calcium Carbonate-Vitamin D (CALCIUM 600 + D PO) [id number] Yes Take 2 tablets by mouth daily. [provider] Taking Active Self  Cholecalciferol (VITAMIN D3) 50 MCG (2000 UT) capsule [id number] Yes Take 5,000 Units by mouth. Once a week [provider] Taking Active Self           Med Note  Rhea Belton Mar 28, 2021  2:53 PM) On Fridays  docusate sodium (COLACE) 100 MG capsule [id number] Yes Take 200 mg by mouth at bedtime. [provider] Taking Active Self           Med Note Grayland Ormond   Mon Apr 29, 2018  2:27 PM) Patient's husband states that patient takes 2 by mouth twice a DAY.  FIBER ADULT GUMMIES PO [id number] Yes Take by mouth. One bid [provider] Taking Active   gabapentin (NEURONTIN) 300 MG capsule [id number] Yes Take 1 capsule (300 mg total) by mouth 2 (two) times daily. 3rd dose if needed Kathyrn Drown, MD Taking Active   HYDROmorphone (DILAUDID) 2 MG tablet [id number] Yes One po three times daily as needed Kathyrn Drown, MD Taking Active            Med Note Grayland Ormond   Tue Nov 02, 2020  2:17 PM) Patient states that she seldom takes this medication.  hydroxychloroquine (PLAQUENIL) 200 MG tablet [id number] Yes Take 1 tablet (200 mg total) by mouth 2 (two) times daily. Kathyrn Drown, MD Taking Active Self  levothyroxine (SYNTHROID) 112 MCG tablet [id number] Yes Take 1 tablet (112 mcg total) by mouth daily. Kathyrn Drown, MD Taking Active   Lysine 500 MG TABS [id number] Yes Take 1 tablet by mouth daily as needed. [provider] Taking Active Self  Multiple Vitamin (MULTIVITAMIN WITH MINERALS) TABS [id number] Yes Take 1 tablet by mouth daily. [provider] Taking Active Self  ondansetron (ZOFRAN) 4 MG tablet [id number] Yes Take 4 mg by mouth every 8 (eight) hours as needed for nausea or vomiting. [provider] Taking Active   pantoprazole (PROTONIX) 40 MG tablet [id number] Yes Take 1 tablet (40 mg total) by mouth daily before breakfast. Kathyrn Drown, MD Taking Active   polyethylene glycol (MIRALAX / GLYCOLAX) packet [id number] Yes Take 17 g by mouth daily as needed for mild constipation. Constipation [provider] Taking Active Self           Med Note (TODD, TAMMY M   Tue Oct 29, 2018  1:50  PM) Every third day as needed.  pravastatin (PRAVACHOL) 20 MG tablet [id number] Yes Take 1 tablet (20 mg total) by mouth daily. Kathyrn Drown, MD Taking Active            Med Note Rhea Belton Mar 28, 2021  2:50 PM) Takes three times per week  predniSONE (DELTASONE) 2.5 MG tablet [id number] Yes Take 7.5 mg by mouth daily with breakfast. [provider] Taking Active Self  Probiotic Product (PROBIOTIC DAILY PO) [id number] Yes Take by mouth daily. [provider] Taking Active Self  promethazine (PHENERGAN) 25 MG tablet [id number] Yes Take 1 tablet by mouth twice a day as needed for nausea or vomiting  Patient taking differently: Take 12.5 mg by mouth at bedtime as needed.   Kathyrn Drown, MD Taking Active   Propylene Glycol (SYSTANE BALANCE OP) [id number] Yes Apply to eye 2 (two) times daily. Patient states that she uses 1 drop in each eye as needed [provider] Taking Active Self           Med Note Sherren Mocha, Ronnette Juniper   Tue Nov 02, 2020  2:21 PM) Patient states that she uses as needed during the day  timolol (TIMOPTIC) 0.5 % ophthalmic solution [id number] Yes Place 1 drop into both eyes daily. [provider] Taking Active Self           Med Note Sherren Mocha, Ronnette Juniper   Tue Nov 02, 2020  2:20 PM) Patient states that she is now using 1 drop in each eye daily.  traMADol (ULTRAM) 50 MG tablet [id number] Yes TAKE 1 TABLET BY MOUTH 4 TIMES AS NEEDED. Kathyrn Drown, MD Taking Active   Zoledronic Acid (RECLAST IV) [id number] Yes Inject into the vein. yearly [provider] Taking Active Self  Med Note Grayland Ormond   Tue Nov 02, 2020  2:26 PM) 04/22/2020            Patient Active Problem List   Diagnosis Date Noted   History of colonic polyps 11/02/2020   Constipation 10/28/2019   LFTs abnormal 12/20/2017   Leukopenia 12/20/2017   Hyponatremia 12/20/2017   [facility] spotted fever 12/20/2017   Osteopenia 02/16/2014    Hyperlipidemia 04/22/2013   Hypothyroidism 04/22/2013   GERD (gastroesophageal reflux disease) 01/01/2013   Rheumatoid arthritis ([place]) 12/20/2012   Essential hypertension, benign 12/20/2012   Chronic pain syndrome 12/20/2012    Immunization History  Administered Date(s) Administered   Fluad Quad(high Dose 65+) 05/17/2020, 05/04/2021   Influenza, Seasonal, Injecte, Preservative Fre 06/05/2012   Influenza,inj,Quad PF,6+ Mos 04/22/2013, 05/28/2014, 05/21/2015, 05/15/2016, 05/16/2017, 07/09/2018   Influenza-Unspecified 05/08/2019   Moderna Sars-Covid-2 Vaccination 09/18/2019, 10/17/2019, 07/19/2020   Pneumococcal Conjugate-13 02/16/2014   Pneumococcal Polysaccharide-23 01/04/2010, 11/15/2015   Tdap 06/16/2020   Zoster Recombinat (Shingrix) 08/28/2018, 02/20/2019    Conditions to be addressed/monitored: HTN, HLD, Hypothyroidism, Osteopenia, and RA  Care Plan : Medication Management  Updates made by Beryle Lathe, [place] since 05/23/2021 12:00 AM     Problem: Hypertension, Hyperlipidemia, Rheumatoid Arthritis, Osteopenia, and Hypothyroidism   Priority: High  Onset Date: 03/28/2021     Long-Range Goal: Disease Progression Prevention   Start Date: 03/28/2021  Expected End Date: 06/26/2021  Recent Progress: On track  Priority: High  Note:   Current Barriers:  Unable to achieve control of hypertension   Pharmacist Clinical Goal(s):  Over the next 90 days, patient will ahieve control of hypertension as evidenced by improved blood pressure control through collaboration with PharmD and provider.  Interventions: 1:1 collaboration with Kathyrn Drown, MD regarding development and update of comprehensive plan of care as evidenced by provider attestation and co-signature Inter-disciplinary care team collaboration (see longitudinal plan of care) Comprehensive medication review performed; medication list updated in electronic medical record  Hypertension: Current medications:  amlodipine 5 mg by mouth once daily Intolerances: none Taking medications as directed: yes Side effects thought to be attributed to current medication regimen: no Denies dizziness, lightheadedness, blurred vision, and headache. Does report having to get out of bed slow in the morning. Current exercise: severely limited due to RA and prior right ankle surgery Home blood pressure readings: still does not check frequently Blood pressure control is unclear. Blood pressure continues to be high in clinic, but patient not consistently monitoring outside of office. Blood pressure is above goal of <130/80 mmHg per 2017 AHA/ACC guidelines. Continue amlodipine 5 mg by mouth daily SBP elevated above goal at last 2 visits. Discussed with PCP and will add olmesartan 5 mg by mouth daily. Check BMP in 2 weeks. May need to titrate further but will start conservatively to reduce likelihood of falls/hypotension. Plan to eventually combine with amlodipine as (Azor) once on stable dose. Encourage dietary sodium restriction/DASH diet Recommend home blood pressure monitoring, to bring results in next visit Discussed need for medication compliance Recommend that the patient take blood pressure mediations at bedtime, as opposed to upon waking, since there is some evidence that this may improve blood pressure control (decrease in asleep blood pressure and increased sleep-time relative blood pressure decline, i.e. BP dipping) and may decrease occurrence of major cardiovascular events (See HYGIA Chronotherapy Trial) Reviewed risks of hypertension, principles of treatment and consequences of untreated hypertension  Aortic Atherosclerosis/Hyperlipidemia: Current medications: pravastatin 20 mg by mouth three times  per week (Tuesday, Thursday, and Saturday) Intolerances: none at this time Taking medications as directed: yes Side effects thought to be attributed to current medication regimen: no Controlled; LDL at goal of <100  due to high risk given at least 2 major CV risk factors (advancing age and hypertension) and 10-year risk 10-20% per 2020 AACE/ACE guidelines and TG at goal of <150 per 2020 AACE/ACE guidelines Continue pravastatin 20 mg by mouth three times per week (Tuesday, Thursday, and Saturday) Reviewed risks of hyperlipidemia, principles of treatment and consequences of untreated hyperlipidemia Discussed need for medication compliance Continue to monitor lipids and assess statin intolerance  Osteopenia (Managed by Dr.Aryal Current medications: calcium + vitamin D and zoledronic acid (Reclast) 5 mg IV every 12 months Patient reports that her last dose of Reclast will be the last she receives in a few years. Plan to take a bisphosphonate holiday per Dr. Kathlene November. Continue to monitor bone density every couple years. Intolerances: GI intolerance with risedronate Taking medications as directed: yes Side effects thought to be attributed to current medication regimen: no Last DEXA (10/09/19) Falls reported in last year: unknown but patient is high risk Continue calcium + vitamin D and zoledronic acid (Reclast) per instructions by Dr. Kathlene November.  Recommend 1,000 mg of elemental calcium per day from diet or supplementation. Supplemental calcium not necessary if appropriate amount obtained through diet. Recommend 800 - 2,000 IU of vitamin D supplementation per day to maintain adequate vitamin D levels. Most recent vitamin D level within normal limits  Recommend walking, low impact aerobic exercise, or strength training  Rheumatoid Arthritis Current medications: hydroxychloroquine 200 mg by mouth twice daily and prednisone 7.5 mg by mouth daily  Continue management per Dr. Kathlene November  Hypothyroidism Current medication: levothyroxine 112 mcg by mouth daily TSH and Free T4 within normal limits   Patient Goals/Self-Care Activities Over the next 90  ays, patient will:  Take medications as prescribed Check blood pressure at  least once daily, document, and provide at future appointments  Follow Up Plan: Face-to-face follow up appointment with care management team member scheduled for: 07/04/21      Medication Assistance: None required.  Patient affirms current coverage meets needs.  Patient's preferred pharmacy is:  [facility] [place], [place] - [place] [address] Phone: [phone] Fax: [phone]  [facility]) - [place], [address] [place] [address] [place] [address] Phone: [phone] Fax: [phone]  Follow Up:  Patient agrees to Care Plan and Follow-up.  Plan: Face to Face appointment with care management team member scheduled for: 07/04/21  Kennon Holter, PharmD Clinical Pharmacist [place] [phone]

## 2021-05-23 NOTE — Patient Instructions (Signed)
Sabrina Adams,  It was great to talk to you today!  Please start taking olmesartan (Benicar) 5 mg by mouth daily for blood pressure. Please stop by the lab after about 2 weeks of taking this medication and get your blood drawn to check your kidney function and electrolytes. Please stay hydrated prior to drawing this lab as it may make your kidney function "look bad" if you are dehydrated.   Please call me with any questions or concerns.   Visit Information  PATIENT GOALS:  Goals Addressed             This Visit's Progress    Medication Management       Patient Goals/Self-Care Activities Over the next 90  days, patient will:  Take medications as prescribed Check blood pressure at least once daily, document, and provide at future appointments            Patient verbalizes understanding of instructions provided today and agrees to view in [place].   Face to Face appointment with care management team member scheduled for: 07/04/21  Kennon Holter, PharmD Clinical Pharmacist [place] [phone]

## 2021-05-25 DIAGNOSIS — Z23 Encounter for immunization: Secondary | ICD-10-CM | POA: Diagnosis not present

## 2021-06-06 ENCOUNTER — Other Ambulatory Visit: Payer: Self-pay

## 2021-06-06 ENCOUNTER — Ambulatory Visit (HOSPITAL_COMMUNITY)
Admission: RE | Admit: 2021-06-06 | Discharge: 2021-06-06 | Disposition: A | Discharge status: Home / Self Care | Payer: Medicare Other | Source: Ambulatory Visit | Attending: Family Medicine | Admitting: Family Medicine

## 2021-06-06 DIAGNOSIS — E038 Other specified hypothyroidism: Secondary | ICD-10-CM | POA: Diagnosis not present

## 2021-06-06 DIAGNOSIS — Z1231 Encounter for screening mammogram for malignant neoplasm of breast: Secondary | ICD-10-CM | POA: Diagnosis not present

## 2021-06-06 DIAGNOSIS — M0579 Rheumatoid arthritis with rheumatoid factor of multiple sites without organ or systems involvement: Secondary | ICD-10-CM | POA: Diagnosis not present

## 2021-06-06 DIAGNOSIS — I10 Essential (primary) hypertension: Secondary | ICD-10-CM

## 2021-06-06 DIAGNOSIS — E782 Mixed hyperlipidemia: Secondary | ICD-10-CM

## 2021-06-08 DIAGNOSIS — I10 Essential (primary) hypertension: Secondary | ICD-10-CM | POA: Diagnosis not present

## 2021-06-09 LAB — BASIC METABOLIC PANEL
BUN/Creatinine Ratio: 24 (ref 12–28)
BUN: 21 mg/dL (ref 8–27)
CO2: 28 mmol/L (ref 20–29)
Calcium: 9.8 mg/dL (ref 8.7–10.3)
Chloride: 104 mmol/L (ref 96–106)
Creatinine, Ser: 0.86 mg/dL (ref 0.57–1.00)
Glucose: 95 mg/dL (ref 70–99)
Potassium: 5 mmol/L (ref 3.5–5.2)
Sodium: 146 mmol/L — ABNORMAL HIGH (ref 134–144)
eGFR: 71 mL/min/{1.73_m2} (ref >59)

## 2021-06-13 ENCOUNTER — Other Ambulatory Visit: Payer: Self-pay

## 2021-06-13 ENCOUNTER — Encounter: Payer: Self-pay | Admitting: Family Medicine

## 2021-06-13 ENCOUNTER — Ambulatory Visit (INDEPENDENT_AMBULATORY_CARE_PROVIDER_SITE_OTHER): Payer: Medicare Other | Admitting: Family Medicine

## 2021-06-13 VITALS — BP 134/74 | Temp 98.2°F | Wt 154.0 lb

## 2021-06-13 DIAGNOSIS — I10 Essential (primary) hypertension: Secondary | ICD-10-CM | POA: Diagnosis not present

## 2021-06-13 DIAGNOSIS — K219 Gastro-esophageal reflux disease without esophagitis: Secondary | ICD-10-CM | POA: Diagnosis not present

## 2021-06-13 DIAGNOSIS — E038 Other specified hypothyroidism: Secondary | ICD-10-CM | POA: Diagnosis not present

## 2021-06-13 DIAGNOSIS — Z0000 Encounter for general adult medical examination without abnormal findings: Secondary | ICD-10-CM | POA: Diagnosis not present

## 2021-06-13 DIAGNOSIS — Z1211 Encounter for screening for malignant neoplasm of colon: Secondary | ICD-10-CM | POA: Diagnosis not present

## 2021-06-13 NOTE — Patient Instructions (Signed)
We will connect with gastroenterology to set up the colonoscopy

## 2021-06-13 NOTE — Progress Notes (Addendum)
   Subjective:    Patient ID: Sabrina Adams, female    DOB: [birth year], 75 y.o.   MRN: [id number]  HPI Pt here for follow up. Pt states she thought this was her physical. Pt has labs completed last week. Pt states she believes blood pressure is coming on down. 128/69 on 06/07/21 and yesterday 132/71. No symptoms of HTN. Pt has noticed a rash under left breast area. Pt has used hydrocortisone and antifungal on it. Pt states it may be the elastic band of bra.   AWV- Annual Wellness Visit  The patient was seen for their annual wellness visit. The patient's past medical history, surgical history, and family history were reviewed. Pertinent vaccines were reviewed ( tetanus, pneumonia, shingles, flu) The patient's medication list was reviewed and updated.  The height and weight were entered.  BMI recorded in electronic record elsewhere  Cognitive screening was completed. Outcome of Mini - Cog: passes   Falls /depression screening electronically recorded within record elsewhere  Current tobacco usage:none (All patients who use tobacco were given written and verbal information on quitting)  Recent listing of emergency department/hospitalizations over the past year were reviewed.  current specialist the patient sees on a regular basis: rheumatology   Medicare annual wellness visit patient questionnaire was reviewed.  A written screening schedule for the patient for the next 5-10 years was given. Appropriate discussion of followup regarding next visit was discussed.    Review of Systems     Objective:   Physical Exam  General-in no acute distress Eyes-no discharge Lungs-respiratory rate normal, CTA CV-no murmurs,RRR Extremities skin warm dry no edema Neuro grossly normal Behavior normal, alert Significant rheumatologic issues  Takes her thyroid medicine regular basis and denies any major problems with that    Assessment & Plan:  Annual wellness visit-overall doing well.   Not depressed.  Cognitive functions are doing well.  Preventative measures discussed.  We will help set up colonoscopy Hypertension patient doing well with blood pressure sitting and standing no orthostasis Pain control uses tramadol but rarely uses Dilaudid Rheumatoid arthritis sees her specialist on a regular basis Patient's weight is good for her current condition Reflux under decent control Follow-up again in approximately 3 to 4 months sooner if any problems  1. Other specified hypothyroidism Continue take medication overall doing well  2. Essential hypertension, benign Continue medication watch salt diet blood pressure doing well  3. Gastroesophageal reflux disease, unspecified whether esophagitis present Reflux overall under good control  4. Encounter for screening colonoscopy Screening recommended - Ambulatory referral to Gastroenterology

## 2021-06-14 ENCOUNTER — Telehealth: Payer: Self-pay | Admitting: Family Medicine

## 2021-06-14 NOTE — Progress Notes (Signed)
06/14/21 GI referral placed.

## 2021-06-14 NOTE — Telephone Encounter (Signed)
Left message for patient to call back and schedule Medicare Annual Wellness Visit (AWV) in office.   If unable to come into the office for AWV,  please offer to do virtually or by telephone.  Last AWV: 06/16/2020  Please schedule at anytime with [facility]urse Health Advisor.  40 minute appointment  Any questions, please contact me at [phone]

## 2021-06-14 NOTE — Addendum Note (Signed)
Addended by: Vicente Males on: 06/14/2021 08:50 AM   Modules accepted: Orders

## 2021-06-16 ENCOUNTER — Encounter (INDEPENDENT_AMBULATORY_CARE_PROVIDER_SITE_OTHER): Payer: Self-pay | Admitting: *Deleted

## 2021-06-21 ENCOUNTER — Other Ambulatory Visit: Payer: Self-pay

## 2021-06-21 ENCOUNTER — Ambulatory Visit (INDEPENDENT_AMBULATORY_CARE_PROVIDER_SITE_OTHER): Payer: Medicare Other

## 2021-06-21 ENCOUNTER — Telehealth: Payer: Self-pay

## 2021-06-21 VITALS — Ht 65.0 in | Wt 148.0 lb

## 2021-06-21 DIAGNOSIS — Z0000 Encounter for general adult medical examination without abnormal findings: Secondary | ICD-10-CM | POA: Diagnosis not present

## 2021-06-21 NOTE — Telephone Encounter (Signed)
Called pt for AWV. Pt states she has noticed increasing issues with dry eyes since starting her new BP and Cholesterol medications. Pt asks if it could be related? Thank you.

## 2021-06-21 NOTE — Progress Notes (Signed)
Subjective:   Sabrina Adams is a 75 y.o. female who presents for Medicare Annual (Subsequent) preventive examination. Virtual Visit via Telephone Note  I connected with  [name] on 06/21/21 at 11:40 AM EST by telephone and verified that I am speaking with the correct person using two identifiers.  Location: Patient: Home Provider: [facility] Persons participating in the virtual visit: patient/Nurse Health Advisor   I discussed the limitations, risks, security and privacy concerns of performing an evaluation and management service by telephone and the availability of in person appointments. The patient expressed understanding and agreed to proceed.  Interactive audio and video telecommunications were attempted between this nurse and patient, however failed, due to patient having technical difficulties OR patient did not have access to video capability.  We continued and completed visit with audio only.  Some vital signs may be absent or patient reported.   Chriss Driver, LPN  Review of Systems     Cardiac Risk Factors include: advanced age (>31men, >50 women);hypertension;sedentary lifestyle     Objective:    Today's Vitals   06/21/21 1141  Weight: 148 lb (67.1 kg)  Height: 5\' 5"  (1.651 m)   Body mass index is 24.63 kg/m.  Advanced Directives 06/21/2021 12/20/2017 12/20/2017 09/21/2015 09/17/2015 09/06/2015 09/03/2015  Does Patient Have a Medical Advance Directive? Yes Yes No Yes Yes Yes Yes  Type of Paramedic of [place];Living will Living will;Healthcare Power of [place];Living will [place];Living will [place];Living will [place];Living will  Does patient want to make changes to medical advance directive? - No - Patient declined - No - Patient declined No - Patient declined No - Patient declined No - Patient declined  Copy of Healthcare Power of  Attorney in Chart? - - - No - copy requested No - copy requested No - copy requested No - copy requested  Would patient like information on creating a medical advance directive? - No - Patient declined No - Patient declined - - - -    Current Medications (verified) Outpatient Encounter Medications as of 06/21/2021  Medication Sig   acetaminophen (TYLENOL) 650 MG CR tablet Take 1,300 mg by mouth 2 (two) times daily.   amLODipine (NORVASC) 5 MG tablet Take 1 tablet (5 mg total) by mouth daily.   b complex vitamins tablet Take 1 tablet by mouth daily.   Calcium Carbonate-Vitamin D (CALCIUM 600 + D PO) Take 2 tablets by mouth daily.   Cholecalciferol (VITAMIN D3) 50 MCG (2000 UT) capsule Take 5,000 Units by mouth. Once a week   docusate sodium (COLACE) 100 MG capsule Take 200 mg by mouth at bedtime.   FIBER ADULT GUMMIES PO Take by mouth. One bid   gabapentin (NEURONTIN) 300 MG capsule Take 1 capsule (300 mg total) by mouth 2 (two) times daily. 3rd dose if needed   HYDROmorphone (DILAUDID) 2 MG tablet One po three times daily as needed   hydroxychloroquine (PLAQUENIL) 200 MG tablet Take 1 tablet (200 mg total) by mouth 2 (two) times daily.   levothyroxine (SYNTHROID) 112 MCG tablet Take 1 tablet (112 mcg total) by mouth daily.   Lysine 500 MG TABS Take 1 tablet by mouth daily as needed.   Multiple Vitamin (MULTIVITAMIN WITH MINERALS) TABS Take 1 tablet by mouth daily.   olmesartan (BENICAR) 5 MG tablet Take 1 tablet (5 mg total) by mouth daily.   ondansetron (ZOFRAN) 4  MG tablet Take 4 mg by mouth every 8 (eight) hours as needed for nausea or vomiting.   pantoprazole (PROTONIX) 40 MG tablet Take 1 tablet (40 mg total) by mouth daily before breakfast.   polyethylene glycol (MIRALAX / GLYCOLAX) packet Take 17 g by mouth daily as needed for mild constipation. Constipation   pravastatin (PRAVACHOL) 20 MG tablet Take 1 tablet (20 mg total) by mouth daily.   predniSONE (DELTASONE) 2.5 MG tablet Take  7.5 mg by mouth daily with breakfast.   Probiotic Product (PROBIOTIC DAILY PO) Take by mouth daily.   promethazine (PHENERGAN) 25 MG tablet Take 1 tablet by mouth twice a day as needed for nausea or vomiting (Patient taking differently: Take 12.5 mg by mouth at bedtime as needed.)   Propylene Glycol (SYSTANE BALANCE OP) Apply to eye 2 (two) times daily. Patient states that she uses 1 drop in each eye as needed   timolol (TIMOPTIC) 0.5 % ophthalmic solution Place 1 drop into both eyes daily.   traMADol (ULTRAM) 50 MG tablet TAKE 1 TABLET BY MOUTH 4 TIMES AS NEEDED.   Zoledronic Acid (RECLAST IV) Inject into the vein. yearly   No facility-administered encounter medications on file as of 06/21/2021.    Allergies (verified) Betadine [povidone iodine], Cinnamon, Codeine, Other, Actonel [risedronate sodium], Adhesive [tape], Lidocaine, and Methotrexate derivatives   History: Past Medical History:  Diagnosis Date   Anemia    Arthritis    rheumatoid   Complication of anesthesia    nausea ,  psycotics episodes   GERD (gastroesophageal reflux disease)    Hypertension    Hypothyroidism    Neuromuscular disorder (HCC)    neuropathy  legs   PONV (postoperative nausea and vomiting)    RA (rheumatoid arthritis) ([place])    Sleep apnea    Stroke ([place])    mild   Past Surgical History:  Procedure Laterality Date   ABDOMINAL HYSTERECTOMY  2005   ABDOMINAL SURGERY     2006 colectomy for diverticulitis   ANKLE SURGERY     COLON SURGERY     colostomy then revision   COLONOSCOPY  04/04/2012   Procedure: COLONOSCOPY;  Surgeon: Rogene Houston, MD;  [facility];  Service: Endoscopy;  Laterality: N/A;  410-Ok per [place]   COLONOSCOPY N/A 03/23/2016   Procedure: COLONOSCOPY;  Surgeon: Rogene Houston, MD;  [facility];  Service: Endoscopy;  Laterality: N/A;  2:50   ESOPHAGOGASTRODUODENOSCOPY  04/04/2012   Procedure: ESOPHAGOGASTRODUODENOSCOPY (EGD);  Surgeon: Rogene Houston, MD;   [facility];  Service: Endoscopy;  Laterality: N/A;  possible   HAND ARTHROPLASTY     LUMBAR SPINE SURGERY  2005   TONSILLECTOMY     Family History  Problem Relation Age of Onset   Hypertension Mother    Hypertension Father    Heart attack Father    Heart disease Father    Social History   Socioeconomic History   Marital status: Married    Spouse name: Clair Gulling   Number of children: 1   Years of education: Not on file   Highest education level: Not on file  Occupational History   Occupation: Retired  Tobacco Use   Smoking status: Never   Smokeless tobacco: Never  Substance and Sexual Activity   Alcohol use: Yes    Comment: rarely   Drug use: No   Sexual activity: Not on file  Other Topics Concern   Not on file  Social History Narrative  Married x 51 years in 2022.   1 son lives in [place], [place]   1 grand daughter.   Social Determinants of Health   Financial Resource Strain: Low Risk    Difficulty of Paying Living Expenses: Not hard at all  Food Insecurity: No Food Insecurity   Worried About Charity fundraiser in the Last Year: Never true   [place] in the Last Year: Never true  Transportation Needs: No Transportation Needs   Lack of Transportation (Medical): No   Lack of Transportation (Non-Medical): No  Physical Activity: Insufficiently Active   Days of Exercise per Week: 3 days   Minutes of Exercise per Session: 20 min  Stress: No Stress Concern Present   Feeling of Stress : Only a little  Social Connections: Engineer, building services of Communication with Friends and Family: More than three times a week   Frequency of Social Gatherings with Friends and Family: More than three times a week   Attends Religious Services: More than 4 times per year   Active Member of [employer] or Organizations: Yes   Attends Music therapist: More than 4 times per year   Marital Status: Married    Tobacco Counseling Counseling given: Not  Answered   Clinical Intake:  Pre-visit preparation completed: Yes  Pain : No/denies pain     BMI - recorded: 24.63 Nutritional Status: BMI of 19-24  Normal Nutritional Risks: None Diabetes: No  How often do you need to have someone help you when you read instructions, pamphlets, or other written materials from your doctor or pharmacy?: 1 - Never  Diabetic?no  Interpreter Needed?: No  Information entered by :: MJ Tallan Sandoz, LPN   Activities of Daily Living In your present state of health, do you have any difficulty performing the following activities: 06/21/2021  Hearing? N  Vision? N  Difficulty concentrating or making decisions? Y  Comment Memory  Walking or climbing stairs? N  Dressing or bathing? N  Doing errands, shopping? N  Preparing Food and eating ? N  Using the Toilet? N  In the past six months, have you accidently leaked urine? N  Do you have problems with loss of bowel control? N  Managing your Medications? N  Managing your Finances? N  Housekeeping or managing your Housekeeping? N  Some recent data might be hidden    Patient Care Team: Kathyrn Drown, MD as PCP - General (Family Medicine) Beryle Lathe, [facility] (Pharmacist)  Indicate any recent Medical Services you may have received from other than [facility] providers in the past year (date may be approximate).     Assessment:   This is a routine wellness examination for [place].  Hearing/Vision screen Hearing Screening - Comments:: No hearing issues per pt.  Vision Screening - Comments:: Glasses Dr. Jorja Loa. 2022.   Dietary issues and exercise activities discussed: Current Exercise Habits: Home exercise routine, Type of exercise: walking;stretching, Time (Minutes): 20, Frequency (Times/Week): 3, Weekly Exercise (Minutes/Week): 60, Exercise limited by: cardiac condition(s);orthopedic condition(s)   Goals Addressed             This Visit's Progress    Have 3 meals a day       Maintain  current health       Depression Screen PHQ 2/9 Scores 06/21/2021 04/18/2021 12/14/2020 06/16/2020 07/09/2018 06/13/2018 09/13/2017  PHQ - 2 Score 0 0 0 0 0 0 0    Fall Risk Fall Risk  06/21/2021 04/18/2021 12/14/2020 06/16/2020  11/24/2019  Falls in the past year? 1 0 0 0 0  Comment - - - - -  Number falls in past yr: 0 0 - - -  Comment - - - - -  Injury with Fall? 0 0 - - 0  Risk for fall due to : Impaired balance/gait;Impaired mobility;Orthopedic patient Impaired mobility History of fall(s) - -  Follow up Falls prevention discussed Falls evaluation completed Falls evaluation completed Education provided;Falls evaluation completed Falls evaluation completed    FALL RISK PREVENTION PERTAINING TO THE HOME:  Any stairs in or around the home? Yes  If so, are there any without handrails? No  Home free of loose throw rugs in walkways, pet beds, electrical cords, etc? Yes  Adequate lighting in your home to reduce risk of falls? Yes   ASSISTIVE DEVICES UTILIZED TO PREVENT FALLS:  Life alert? No  Use of a cane, walker or w/c? Yes  Grab bars in the bathroom? No  Shower chair or bench in shower? Yes  Elevated toilet seat or a handicapped toilet? Yes   TIMED UP AND GO:  Was the test performed? No .   Cognitive Function:     6CIT Screen 06/21/2021  What Year? 0 points  What month? 0 points  What time? 0 points  Count back from 20 0 points  Months in reverse 2 points  Repeat phrase 0 points  Total Score 2    Immunizations Immunization History  Administered Date(s) Administered   Fluad Quad(high Dose 65+) 05/17/2020, 05/04/2021   Influenza, Seasonal, Injecte, Preservative Fre 06/05/2012   Influenza,inj,Quad PF,6+ Mos 04/22/2013, 05/28/2014, 05/21/2015, 05/15/2016, 05/16/2017, 07/09/2018   Influenza-Unspecified 05/08/2019   Moderna Sars-Covid-2 Vaccination 09/18/2019, 10/17/2019, 07/19/2020   Pneumococcal Conjugate-13 02/16/2014   Pneumococcal Polysaccharide-23 01/04/2010,  11/15/2015   Tdap 06/16/2020   Unspecified SARS-COV-2 Vaccination 05/25/2021   Zoster Recombinat (Shingrix) 08/28/2018, 02/20/2019    TDAP status: Up to date  Flu Vaccine status: Up to date  Pneumococcal vaccine status: Up to date  Covid-19 vaccine status: Completed vaccines  Qualifies for Shingles Vaccine? Yes   Zostavax completed Yes   Shingrix Completed?: Yes  Screening Tests Health Maintenance  Topic Date Due   COLONOSCOPY (Pts 45-57yrs Insurance coverage will need to be confirmed)  03/23/2021   COVID-19 Vaccine (5 - Booster for Moderna series) 07/20/2021   MAMMOGRAM  06/07/2023   TETANUS/TDAP  06/16/2030   Pneumonia Vaccine 61+ Years old  Completed   INFLUENZA VACCINE  Completed   DEXA SCAN  Completed   Hepatitis C Screening  Completed   Zoster Vaccines- Shingrix  Completed   HPV VACCINES  Aged Out    Health Maintenance  Health Maintenance Due  Topic Date Due   COLONOSCOPY (Pts 45-54yrs Insurance coverage will need to be confirmed)  03/23/2021    Colorectal cancer screening: Type of screening: Colonoscopy. Completed 03/24/2011. Repeat every 5 years  Mammogram status: Completed 06/06/21. Repeat every year  Bone Density status: Completed 10/09/2019. Results reflect: Bone density results: OSTEOPENIA. Repeat every 2 years.  Lung Cancer Screening: (Low Dose CT Chest recommended if Age 18-80 years, 30 pack-year currently smoking OR have quit w/in 15years.) does not qualify.  Non smoker.  Additional Screening:  Hepatitis C Screening: does qualify; Completed 11/13/2016  Vision Screening: Recommended annual ophthalmology exams for early detection of glaucoma and other disorders of the eye. Is the patient up to date with their annual eye exam?  Yes  Who is the provider or what is the name of  the office in which the patient attends annual eye exams? Dr. Jorja Loa If pt is not established with a provider, would they like to be referred to a provider to establish care? No .    Dental Screening: Recommended annual dental exams for proper oral hygiene  Community Resource Referral / Chronic Care Management: CRR required this visit?  No   CCM required this visit?  No      Plan:     I have personally reviewed and noted the following in the patient's chart:   Medical and social history Use of alcohol, tobacco or illicit drugs  Current medications and supplements including opioid prescriptions.  Functional ability and status Nutritional status Physical activity Advanced directives List of other physicians Hospitalizations, surgeries, and ER visits in previous 12 months Vitals Screenings to include cognitive, depression, and falls Referrals and appointments  In addition, I have reviewed and discussed with patient certain preventive protocols, quality metrics, and best practice recommendations. A written personalized care plan for preventive services as well as general preventive health recommendations were provided to patient.     Chriss Driver, LPN   [id number]   Nurse Notes: Up to date on health maintenance, pt states she has turned in paper work to have colonoscopy scheduled with Dr. Dereck Leep. Upt to date on all vaccines.

## 2021-06-21 NOTE — Patient Instructions (Signed)
Sabrina Adams , Thank you for taking time to come for your Medicare Wellness Visit. I appreciate your ongoing commitment to your health goals. Please review the following plan we discussed and let me know if I can assist you in the future.   Screening recommendations/referrals: Colonoscopy: Done 03/24/2011. Repeat in 5 years Call the office to have order placed if you have not heard anything from Dr. Ewell Poe office by the end of November to schedule appointment.  Mammogram: Done 06/06/2021. Bone Density: Done 10/09/2019 Repeat every 2 years  Recommended yearly ophthalmology/optometry visit for glaucoma screening and checkup Recommended yearly dental visit for hygiene and checkup  Vaccinations: Influenza vaccine: Done 05/04/21 Repeat annually  Pneumococcal vaccine: Done 02/16/2014, 11/15/2015, 01/05/2016 Tdap vaccine: Done 06/16/2020 Repeat in 10 years  Shingles vaccine: Done 08/28/2018 and 02/20/2019   Covid-19:Done 09/18/2019, 10/17/2019 07/19/2020  Advanced directives: Copy in chart.  Conditions/risks identified: Aim for 30 minutes of exercise each day, drink 6-8 glasses of water and eat lots of fruits and vegetables.   Next appointment: Follow up in one year for your annual wellness visit 2023   Preventive Care 65 Years and Older, Female Preventive care refers to lifestyle choices and visits with your health care provider that can promote health and wellness. What does preventive care include? A yearly physical exam. This is also called an annual well check. Dental exams once or twice a year. Routine eye exams. Ask your health care provider how often you should have your eyes checked. Personal lifestyle choices, including: Daily care of your teeth and gums. Regular physical activity. Eating a healthy diet. Avoiding tobacco and drug use. Limiting alcohol use. Practicing safe sex. Taking low-dose aspirin every day. Taking vitamin and mineral supplements as recommended by your health  care provider. What happens during an annual well check? The services and screenings done by your health care provider during your annual well check will depend on your age, overall health, lifestyle risk factors, and family history of disease. Counseling  Your health care provider may ask you questions about your: Alcohol use. Tobacco use. Drug use. Emotional well-being. Home and relationship well-being. Sexual activity. Eating habits. History of falls. Memory and ability to understand (cognition). Work and work Statistician. Reproductive health. Screening  You may have the following tests or measurements: Height, weight, and BMI. Blood pressure. Lipid and cholesterol levels. These may be checked every 5 years, or more frequently if you are over 49 years old. Skin check. Lung cancer screening. You may have this screening every year starting at age 52 if you have a 30-pack-year history of smoking and currently smoke or have quit within the past 15 years. Fecal occult blood test (FOBT) of the stool. You may have this test every year starting at age 32. Flexible sigmoidoscopy or colonoscopy. You may have a sigmoidoscopy every 5 years or a colonoscopy every 10 years starting at age 16. Hepatitis C blood test. Hepatitis B blood test. Sexually transmitted disease (STD) testing. Diabetes screening. This is done by checking your blood sugar (glucose) after you have not eaten for a while (fasting). You may have this done every 1-3 years. Bone density scan. This is done to screen for osteoporosis. You may have this done starting at age 37. Mammogram. This may be done every 1-2 years. Talk to your health care provider about how often you should have regular mammograms. Talk with your health care provider about your test results, treatment options, and if necessary, the need for more tests. Vaccines  Your health care provider may recommend certain vaccines, such as: Influenza vaccine. This is  recommended every year. Tetanus, diphtheria, and acellular pertussis (Tdap, Td) vaccine. You may need a Td booster every 10 years. Zoster vaccine. You may need this after age 22. Pneumococcal 13-valent conjugate (PCV13) vaccine. One dose is recommended after age 93. Pneumococcal polysaccharide (PPSV23) vaccine. One dose is recommended after age 41. Talk to your health care provider about which screenings and vaccines you need and how often you need them. This information is not intended to replace advice given to you by your health care provider. Make sure you discuss any questions you have with your health care provider. Document Released: 08/20/2015 Document Revised: 04/12/2016 Document Reviewed: 05/25/2015 Elsevier Interactive Patient Education  2017 [place] Prevention in the Home Falls can cause injuries. They can happen to people of all ages. There are many things you can do to make your home safe and to help prevent falls. What can I do on the outside of my home? Regularly fix the edges of walkways and driveways and fix any cracks. Remove anything that might make you trip as you walk through a door, such as a raised step or threshold. Trim any bushes or trees on the path to your home. Use bright outdoor lighting. Clear any walking paths of anything that might make someone trip, such as rocks or tools. Regularly check to see if handrails are loose or broken. Make sure that both sides of any steps have handrails. Any raised decks and porches should have guardrails on the edges. Have any leaves, snow, or ice cleared regularly. Use sand or salt on walking paths during winter. Clean up any spills in your garage right away. This includes oil or grease spills. What can I do in the bathroom? Use night lights. Install grab bars by the toilet and in the tub and shower. Do not use towel bars as grab bars. Use non-skid mats or decals in the tub or shower. If you need to sit down in  the shower, use a plastic, non-slip stool. Keep the floor dry. Clean up any water that spills on the floor as soon as it happens. Remove soap buildup in the tub or shower regularly. Attach bath mats securely with double-sided non-slip rug tape. Do not have throw rugs and other things on the floor that can make you trip. What can I do in the bedroom? Use night lights. Make sure that you have a light by your bed that is easy to reach. Do not use any sheets or blankets that are too big for your bed. They should not hang down onto the floor. Have a firm chair that has side arms. You can use this for support while you get dressed. Do not have throw rugs and other things on the floor that can make you trip. What can I do in the kitchen? Clean up any spills right away. Avoid walking on wet floors. Keep items that you use a lot in easy-to-reach places. If you need to reach something above you, use a strong step stool that has a grab bar. Keep electrical cords out of the way. Do not use floor polish or wax that makes floors slippery. If you must use wax, use non-skid floor wax. Do not have throw rugs and other things on the floor that can make you trip. What can I do with my stairs? Do not leave any items on the stairs. Make sure that there are  handrails on both sides of the stairs and use them. Fix handrails that are broken or loose. Make sure that handrails are as long as the stairways. Check any carpeting to make sure that it is firmly attached to the stairs. Fix any carpet that is loose or worn. Avoid having throw rugs at the top or bottom of the stairs. If you do have throw rugs, attach them to the floor with carpet tape. Make sure that you have a light switch at the top of the stairs and the bottom of the stairs. If you do not have them, ask someone to add them for you. What else can I do to help prevent falls? Wear shoes that: Do not have high heels. Have rubber bottoms. Are comfortable  and fit you well. Are closed at the toe. Do not wear sandals. If you use a stepladder: Make sure that it is fully opened. Do not climb a closed stepladder. Make sure that both sides of the stepladder are locked into place. Ask someone to hold it for you, if possible. Clearly mark and make sure that you can see: Any grab bars or handrails. First and last steps. Where the edge of each step is. Use tools that help you move around (mobility aids) if they are needed. These include: Canes. Walkers. Scooters. Crutches. Turn on the lights when you go into a dark area. Replace any light bulbs as soon as they burn out. Set up your furniture so you have a clear path. Avoid moving your furniture around. If any of your floors are uneven, fix them. If there are any pets around you, be aware of where they are. Review your medicines with your doctor. Some medicines can make you feel dizzy. This can increase your chance of falling. Ask your doctor what other things that you can do to help prevent falls. This information is not intended to replace advice given to you by your health care provider. Make sure you discuss any questions you have with your health care provider. Document Released: 05/20/2009 Document Revised: 12/30/2015 Document Reviewed: 08/28/2014 Elsevier Interactive Patient Education  2017 [employer].

## 2021-06-22 NOTE — Telephone Encounter (Signed)
Pt contacted and verbalized understanding.  

## 2021-06-22 NOTE — Telephone Encounter (Signed)
I do not see that this would be a likely cause due to these medicines.  I would recommend trying OTC eyedrops that help with dry eyes If ongoing troubles follow through with eye doctor

## 2021-06-27 ENCOUNTER — Other Ambulatory Visit: Payer: Self-pay

## 2021-06-27 ENCOUNTER — Encounter (INDEPENDENT_AMBULATORY_CARE_PROVIDER_SITE_OTHER): Payer: Self-pay | Admitting: Gastroenterology

## 2021-06-27 ENCOUNTER — Encounter (HOSPITAL_BASED_OUTPATIENT_CLINIC_OR_DEPARTMENT_OTHER): Payer: Self-pay

## 2021-06-27 ENCOUNTER — Ambulatory Visit (HOSPITAL_BASED_OUTPATIENT_CLINIC_OR_DEPARTMENT_OTHER)
Admission: RE | Admit: 2021-06-27 | Discharge: 2021-06-27 | Disposition: A | Discharge status: Home / Self Care | Payer: Medicare Other | Source: Ambulatory Visit | Attending: Gastroenterology | Admitting: Gastroenterology

## 2021-06-27 ENCOUNTER — Ambulatory Visit (INDEPENDENT_AMBULATORY_CARE_PROVIDER_SITE_OTHER): Payer: Medicare Other | Admitting: Gastroenterology

## 2021-06-27 VITALS — BP 103/63 | HR 73 | Temp 98.9°F | Ht 65.0 in | Wt 148.4 lb

## 2021-06-27 DIAGNOSIS — R197 Diarrhea, unspecified: Secondary | ICD-10-CM | POA: Diagnosis not present

## 2021-06-27 DIAGNOSIS — R1032 Left lower quadrant pain: Secondary | ICD-10-CM | POA: Insufficient documentation

## 2021-06-27 DIAGNOSIS — R112 Nausea with vomiting, unspecified: Secondary | ICD-10-CM | POA: Diagnosis not present

## 2021-06-27 DIAGNOSIS — R1110 Vomiting, unspecified: Secondary | ICD-10-CM | POA: Diagnosis not present

## 2021-06-27 DIAGNOSIS — K625 Hemorrhage of anus and rectum: Secondary | ICD-10-CM

## 2021-06-27 MED ORDER — IOHEXOL 300 MG/ML  SOLN
100.0000 mL | Freq: Once | INTRAMUSCULAR | Status: AC | PRN
  Administered 2021-06-27: 100 mL via INTRAVENOUS

## 2021-06-27 NOTE — Progress Notes (Signed)
Referring Provider: Kathyrn Drown, MD Primary Care Physician:  Kathyrn Drown, MD Primary GI Physician: Rehman  Chief Complaint  Patient presents with   Diarrhea    Diarrhea and vomiting about 4 days ago for the whole night and has been seeing blood  in stool since then.   HPI:   Sabrina Adams is a 75 y.o. female with past medical history of anemia, arthritis, GERD, HTN, hypothyroidism, RA, sleep apnea, complicated diverticulitis s/p resection of sigmoid colon with colostomy (09/2015) with subsequent colostomy reversal.   Patient presenting today for diarrhea and rectal bleeding.  Patient reports that on Thursday evening she started having lower abdominal cramping, diarrhea, nausea and vomiting and rectal bleeding that lasted throughout the night. Diarrhea was watery in nature with multiple episodes occurring. She reports that abdominal cramping and nausea eased off by the next day. She was still having rectal bleeding and diarrhea. Diarrhea subsided late Saturday and last rectal bleeding episode was yesterday. She reports that initially she noticed darker rectal bleeding then started noticing BRBPR in the toilet as well as when wiping. She reports noticing bright red blood in the toilet bowl. She has not had a formed bowel movement since. She reports some continue queasiness. She denies any blood with vomiting. She is unsure of fever but reports she had sweating and chills intermittently. She had fecal urgency and incontinence during her acute illness phase. She denies any recent sick contacts, abx or travel. Hx of complicated diverticulitis with perforation in 2017.   She denies any issues with appetite or weight loss prior to acute illness.   She reports that protonix daily keeps her GERD well under control.  She denies any dysphagia or odynophagia.   Last Colonoscopy:03/23/16 77mm tubular adenoma, ulceration at splenic flexure, suspect ischemic r/t NSAID use Last  Endoscopy:n/a  Recommendations:  Due for surveillance colonoscopy aug 2022  Past Medical History:  Diagnosis Date   Anemia    Arthritis    rheumatoid   Complication of anesthesia    nausea ,  psycotics episodes   GERD (gastroesophageal reflux disease)    Hypertension    Hypothyroidism    Neuromuscular disorder (HCC)    neuropathy  legs   PONV (postoperative nausea and vomiting)    RA (rheumatoid arthritis) ([place])    Sleep apnea    Stroke ([place])    mild    Past Surgical History:  Procedure Laterality Date   ABDOMINAL HYSTERECTOMY  2005   ABDOMINAL SURGERY     2006 colectomy for diverticulitis   ANKLE SURGERY     COLON SURGERY     colostomy then revision   COLONOSCOPY  04/04/2012   Procedure: COLONOSCOPY;  Surgeon: Rogene Houston, MD;  [facility]: [facility];  Service: Endoscopy;  Laterality: N/A;  410-Ok per [place]   COLONOSCOPY N/A 03/23/2016   Procedure: COLONOSCOPY;  Surgeon: Rogene Houston, MD;  Location: [facility];  Service: Endoscopy;  Laterality: N/A;  2:50   ESOPHAGOGASTRODUODENOSCOPY  04/04/2012   Procedure: ESOPHAGOGASTRODUODENOSCOPY (EGD);  Surgeon: Rogene Houston, MD;  Location: [facility];  Service: Endoscopy;  Laterality: N/A;  possible   HAND ARTHROPLASTY     LUMBAR SPINE SURGERY  2005   TONSILLECTOMY      Current Outpatient Medications  Medication Sig Dispense Refill   acetaminophen (TYLENOL) 650 MG CR tablet Take 1,300 mg by mouth 2 (two) times daily.     amLODipine (NORVASC) 5 MG tablet Take 1 tablet (5  mg total) by mouth daily. 90 tablet 1   b complex vitamins tablet Take 1 tablet by mouth daily.     Calcium Carbonate-Vitamin D (CALCIUM 600 + D PO) Take 1 tablet by mouth daily.     Cholecalciferol (VITAMIN D3) 50 MCG (2000 UT) capsule Take 5,000 Units by mouth. Once a week     docusate sodium (COLACE) 100 MG capsule Take 200 mg by mouth at bedtime.     FIBER ADULT GUMMIES PO Take by mouth. One bid     gabapentin (NEURONTIN) 300 MG capsule  Take 1 capsule (300 mg total) by mouth 2 (two) times daily. 3rd dose if needed 270 capsule 1   HYDROmorphone (DILAUDID) 2 MG tablet One po three times daily as needed 90 tablet 0   hydroxychloroquine (PLAQUENIL) 200 MG tablet Take 1 tablet (200 mg total) by mouth 2 (two) times daily. 180 tablet 1   levothyroxine (SYNTHROID) 112 MCG tablet Take 1 tablet (112 mcg total) by mouth daily. 90 tablet 1   Lysine 500 MG TABS Take 1 tablet by mouth daily as needed.     Multiple Vitamin (MULTIVITAMIN WITH MINERALS) TABS Take 1 tablet by mouth daily.     olmesartan (BENICAR) 5 MG tablet Take 1 tablet (5 mg total) by mouth daily. 30 tablet 2   ondansetron (ZOFRAN) 4 MG tablet Take 4 mg by mouth every 8 (eight) hours as needed for nausea or vomiting.     pantoprazole (PROTONIX) 40 MG tablet Take 1 tablet (40 mg total) by mouth daily before breakfast. 90 tablet 1   polyethylene glycol (MIRALAX / GLYCOLAX) packet Take 17 g by mouth daily as needed for mild constipation. Constipation     pravastatin (PRAVACHOL) 20 MG tablet Take 1 tablet (20 mg total) by mouth daily. 30 tablet 5   predniSONE (DELTASONE) 2.5 MG tablet Take 7.5 mg by mouth daily with breakfast.     Probiotic Product (PROBIOTIC DAILY PO) Take by mouth daily.     promethazine (PHENERGAN) 25 MG tablet Take 1 tablet by mouth twice a day as needed for nausea or vomiting (Patient taking differently: Take 12.5 mg by mouth at bedtime as needed.) 30 tablet 4   Propylene Glycol (SYSTANE BALANCE OP) Apply to eye 2 (two) times daily. Patient states that she uses 1 drop in each eye as needed     timolol (TIMOPTIC) 0.5 % ophthalmic solution Place 1 drop into both eyes daily.     traMADol (ULTRAM) 50 MG tablet TAKE 1 TABLET BY MOUTH 4 TIMES AS NEEDED. 120 tablet 5   Zoledronic Acid (RECLAST IV) Inject into the vein. yearly     No current facility-administered medications for this visit.    Allergies as of 06/27/2021 - Review Complete 06/27/2021  Allergen  Reaction Noted   Betadine [povidone iodine] Itching 01/23/2012   Cinnamon Anaphylaxis 04/01/2012   Codeine Nausea Only 01/23/2012   Other Nausea And Vomiting and Other (See Comments) 04/04/2012   Actonel [risedronate sodium]  10/12/2012   Adhesive [tape] Other (See Comments) 04/01/2012   Lidocaine  04/04/2012   Methotrexate derivatives  10/13/2014    Family History  Problem Relation Age of Onset   Hypertension Mother    Hypertension Father    Heart attack Father    Heart disease Father     Social History   Socioeconomic History   Marital status: Married    Spouse name: Clair Gulling   Number of children: 1   Years of education: Not  on file   Highest education level: Not on file  Occupational History   Occupation: Retired  Tobacco Use   Smoking status: Never   Smokeless tobacco: Never  Substance and Sexual Activity   Alcohol use: Yes    Comment: rarely   Drug use: No   Sexual activity: Not on file  Other Topics Concern   Not on file  Social History Narrative   Married x 51 years in 2022.   1 son lives in [place], [place]   1 grand daughter.   Social Determinants of Health   Financial Resource Strain: Low Risk    Difficulty of Paying Living Expenses: Not hard at all  Food Insecurity: No Food Insecurity   Worried About Charity fundraiser in the Last Year: Never true   [place] in the Last Year: Never true  Transportation Needs: No Transportation Needs   Lack of Transportation (Medical): No   Lack of Transportation (Non-Medical): No  Physical Activity: Insufficiently Active   Days of Exercise per Week: 3 days   Minutes of Exercise per Session: 20 min  Stress: No Stress Concern Present   Feeling of Stress : Only a little  Social Connections: Engineer, building services of Communication with Friends and Family: More than three times a week   Frequency of Social Gatherings with Friends and Family: More than three times a week   Attends Religious Services: More than  4 times per year   Active Member of [employer] or Organizations: Yes   Attends Music therapist: More than 4 times per year   Marital Status: Married    Review of systems General: negative for malaise, night sweats, fever, chills, weight loss Neck: Negative for lumps, goiter, pain and significant neck swelling Resp: Negative for cough, wheezing, dyspnea at rest CV: Negative for chest pain, leg swelling, palpitations, orthopnea GI: denies melena, constipation, dysphagia, odyonophagia, early satiety or unintentional weight loss. +hematochezia, +nausea +vomiting +lower abdominal cramping MSK: Negative for joint pain or swelling, back pain, and muscle pain. Derm: Negative for itching or rash Psych: Denies depression, anxiety, memory loss, confusion. No homicidal or suicidal ideation.  Heme: Negative for prolonged bleeding, bruising easily, and swollen nodes. Endocrine: Negative for cold or heat intolerance, polyuria, polydipsia and goiter. Neuro: negative for tremor, gait imbalance, syncope and seizures. The remainder of the review of systems is noncontributory.  Physical Exam: BP 103/63 (BP Location: Left Arm, Patient Position: Sitting, Cuff Size: Large)   Pulse 73   Temp 98.9 F (37.2 C) (Oral)   Ht 5\' 5"  (1.651 m)   Wt 148 lb 6.4 oz (67.3 kg)   BMI 24.70 kg/m  General:   Alert and oriented. No distress noted. Pleasant and cooperative.  Head:  Normocephalic and atraumatic. Eyes:  Conjuctiva clear without scleral icterus. Mouth:  Oral mucosa pink and moist. Good dentition. No lesions. Heart: Normal rate and rhythm, s1 and s2 heart sounds present.  Lungs: Clear lung sounds in all lobes. Respirations equal and unlabored. Abdomen:  +BS, soft, and non-distended. No rebound or guarding. No HSM or masses noted. TTP of LLQ Derm: No palmar erythema or jaundice Msk:  Symmetrical without gross deformities. Normal posture. Extremities:  Without edema. Neurologic:  Alert and  oriented  x4 Psych:  Alert and cooperative. Normal mood and affect.  Invalid input(s): 6 MONTHS   ASSESSMENT: Sabrina Adams is a 75 y.o. female presenting today with acute onset of n/v/d with rectal bleeding and  lower abdominal cramping, onset Thursday evening. Diarrhea last until Saturday and was watery in nature with multiple episodes occurring. Last episode of rectal bleeding was yesterday. She denies hematemesis. She has had no recent sick contacts, antibiotic therapy or travel.  She has had no weight loss, changes in appetite or other GI issues prior to acute illness onset. She does not take any NSAIDs. She is moderately tender to LLQ with light palpation. Its possible she was experiencing some gastroenteritis or colitis, however, Given her hx of complicated diverticulitis, I will proceed with CT A/P to rule out acute diverticulitis. She denies any symptoms of overt anemia such as dizziness, shortness of breath or syncope. Reassuringly her symptoms have almost completely resolved, I will hold off on initiating antibiotics unless CT reveals diverticulitis. She was due for surveillance colonoscopy in August 2022. If CT reveals acute diverticulitis, we will plan for colonoscopy in 6-8 weeks. If CT is negative, we will go ahead and proceed with colonoscopy. Further recommendations to follow after CT results.   PLAN:  CT A/P w contrast, further recommendations to follow results 2. Schedule colonoscopy, will need to be held 6-8 weeks if diverticulitis present 3. Patient to let me know if she develops worsening abdominal pain, rectal bleeding or diarrhea again.  4. Continue with mostly liquid diet 5. Further recommendations to follow pending CT results   Follow Up: TBD  Kenzli Barritt L. Alver Sorrow, MSN, APRN, AGNP-C Adult-Gerontology Nurse Practitioner [facility]

## 2021-06-27 NOTE — Patient Instructions (Addendum)
We will get you scheduled for CT of your abdomen to rule out diverticulitis. Please let me know if you develop rectal bleeding, diarrhea, or worsening abdominal pain. I will be in touch regarding results of CT scan.  We will get you scheduled for colonoscopy after we know results of CT, as, Colonoscopy will need to be delayed if diverticulitis is present. If CT does not show diverticulitis, we will go ahead and get you scheduled for colonoscopy  Continue to stay well hydrated, I would suggest continuing with mostly liquids at this time to give your bowels time to rest, we will discuss further diet changes after CT results are back.

## 2021-06-29 ENCOUNTER — Telehealth (INDEPENDENT_AMBULATORY_CARE_PROVIDER_SITE_OTHER): Payer: Self-pay

## 2021-06-29 ENCOUNTER — Encounter (INDEPENDENT_AMBULATORY_CARE_PROVIDER_SITE_OTHER): Payer: Self-pay

## 2021-06-29 ENCOUNTER — Other Ambulatory Visit (INDEPENDENT_AMBULATORY_CARE_PROVIDER_SITE_OTHER): Payer: Self-pay

## 2021-06-29 DIAGNOSIS — Z86010 Personal history of colonic polyps: Secondary | ICD-10-CM

## 2021-06-29 MED ORDER — PEG 3350-KCL-NA BICARB-NACL 420 G PO SOLR: 4000.0000 mL | ORAL | 0 refills | Status: DC

## 2021-06-29 NOTE — Telephone Encounter (Signed)
LeighAnn Makenli Derstine, CMA  

## 2021-07-04 ENCOUNTER — Other Ambulatory Visit: Payer: Self-pay

## 2021-07-04 ENCOUNTER — Ambulatory Visit (INDEPENDENT_AMBULATORY_CARE_PROVIDER_SITE_OTHER): Payer: Medicare Other | Admitting: Pharmacist

## 2021-07-04 VITALS — BP 137/71 | HR 73

## 2021-07-04 DIAGNOSIS — M8580 Other specified disorders of bone density and structure, unspecified site: Secondary | ICD-10-CM

## 2021-07-04 DIAGNOSIS — E038 Other specified hypothyroidism: Secondary | ICD-10-CM

## 2021-07-04 DIAGNOSIS — E782 Mixed hyperlipidemia: Secondary | ICD-10-CM

## 2021-07-04 DIAGNOSIS — M0579 Rheumatoid arthritis with rheumatoid factor of multiple sites without organ or systems involvement: Secondary | ICD-10-CM

## 2021-07-04 DIAGNOSIS — I10 Essential (primary) hypertension: Secondary | ICD-10-CM

## 2021-07-04 NOTE — Chronic Care Management (AMB) (Addendum)
I agree with the clinical pharmacist that it is important to keep blood pressure under good control.  Given the patient's age and frailty I recommend following AFP guideline to keep top number below 140/90.  We will go lower if the patient tolerates that without orthostasis or dizziness.  She is at high risk for falls.  This addendum constitutes cosign note as well.  Appreciate clinical pharmacist input.  Thanks-Dr. Nicki Reaper   Chronic Care Management Pharmacy Note  07/04/2021 Name:  Sabrina Adams MRN:  [id number] DOB:  Jun 18, 1946  Summary:  Hypertension: BMP after starting olmesartan within normal limits Home blood pressure readings: 103/63 one day last week; patient did not have any other home blood pressure values in office visit today Blood pressure control is unclear. Blood pressure high in office today but within normal limits at last office visit and per patient report are improving at home. Blood pressure goal is <130/80 mmHg per 2017 AHA/ACC guidelines. Continue current medications as above for now. May need to titrate olmesartan further but will follow-up with patient at next visit to review home blood pressure values. May be able to combine olmesartan with amlodipine as (Azor) once on stable dose. Combination pill would require higher dose of olmesartan so will currently keep separate.  Recommend home blood pressure monitoring, to bring results in next visit  Subjective: Sabrina Adams is an 75 y.o. year old female who is a primary patient of Luking, Elayne Snare, MD.  The CCM team was consulted for assistance with disease management and care coordination needs.    Engaged with patient face to face for follow up visit in response to provider referral for pharmacy case management and/or care coordination services.   Consent to Services:  The patient was given information about Chronic Care Management services, agreed to services, and gave verbal consent prior to initiation of services.   Please see initial visit note for detailed documentation.   Patient Care Team: Kathyrn Drown, MD as PCP - General (Family Medicine) Beryle Lathe, [facility] (Pharmacist)  Objective:  Lab Results  Component Value Date   CREATININE 0.86 06/08/2021   CREATININE 0.78 11/14/2019   CREATININE 0.73 06/05/2018    Lab Results  Component Value Date   HGBA1C 5.9 (H) 11/16/2015   Last diabetic Eye exam:  Lab Results  Component Value Date/Time   HMDIABEYEEXA No Retinopathy 02/17/2021 12:00 AM    Last diabetic Foot exam: No results found for: HMDIABFOOTEX      Component Value Date/Time   CHOL 198 03/18/2021 1427   TRIG 118 03/18/2021 1427   HDL 90 03/18/2021 1427   CHOLHDL 2.2 03/18/2021 1427   CHOLHDL 3.3 09/29/2013 1248   VLDL 41 (H) 09/29/2013 1248   LDLCALC 88 03/18/2021 1427    Hepatic Function Latest Ref Rng & Units 11/14/2019 01/09/2018 12/23/2017  Total Protein 6.0 - 8.5 g/dL 6.6 7.2 5.3(L)  Albumin 3.7 - 4.7 g/dL 4.5 4.2 2.5(L)  AST 0 - 40 IU/L 35 33 145(H)  ALT 0 - 32 IU/L 19 22 71(H)  Alk Phosphatase 39 - 117 IU/L 72 111 116  Total Bilirubin 0.0 - 1.2 mg/dL 0.4 0.4 0.7  Bilirubin, Direct 0.00 - 0.40 mg/dL 0.12 0.11 -    Lab Results  Component Value Date/Time   TSH 1.170 03/18/2021 02:27 PM   TSH 2.620 06/22/2020 09:22 AM   FREET4 1.64 03/18/2021 02:27 PM    CBC Latest Ref Rng & Units 06/05/2018 01/09/2018 12/23/2017  WBC 3.4 -  10.8 x10E3/uL 4.1 5.9 4.2  Hemoglobin 11.1 - 15.9 g/dL 14.9 14.4 12.2  Hematocrit 34.0 - 46.6 % 44.2 42.8 37.4  Platelets 150 - 450 x10E3/uL 254 286 98(L)    Lab Results  Component Value Date/Time   VD25OH 45.2 03/18/2021 02:27 PM   VD25OH 43.8 11/14/2019 12:38 PM    Clinical ASCVD: No  The 10-year ASCVD risk score (Arnett DK, et al., 2019) is: 18.8%   Values used to calculate the score:     Age: 75 years     Sex: Female     Is Non-Hispanic African American: No     Diabetic: No     Tobacco smoker: No     Systolic Blood  Pressure: 137 mmHg     Is BP treated: Yes     HDL Cholesterol: 90 mg/dL     Total Cholesterol: 198 mg/dL     Social History   Tobacco Use  Smoking Status Never  Smokeless Tobacco Never   BP Readings from Last 3 Encounters:  07/04/21 137/71  06/27/21 103/63  06/13/21 134/74   Pulse Readings from Last 3 Encounters:  07/04/21 73  06/27/21 73  05/23/21 74   Wt Readings from Last 3 Encounters:  06/27/21 148 lb 6.4 oz (67.3 kg)  06/21/21 148 lb (67.1 kg)  06/13/21 154 lb (69.9 kg)    Assessment: Review of patient past medical history, allergies, medications, health status, including review of consultants reports, laboratory and other test data, was performed as part of comprehensive evaluation and provision of chronic care management services.   SDOH:  (Social Determinants of Health) assessments and interventions performed:    CCM Care Plan  Allergies  Allergen Reactions   Betadine [Povidone Iodine] Itching    "Burns"   Cinnamon Anaphylaxis    Throat and Tongue    Codeine Nausea Only    Patient can become woozy   Other Nausea And Vomiting and Other (See Comments)    Pt states "all narcotic pain medications make me sick to my stomach and sometimes make me woozy   Actonel [Risedronate Sodium]     Stomach upset   Adhesive [Tape] Other (See Comments)    Irritates skin    Lidocaine     Causes vasoconstriction per patient   Methotrexate Derivatives     Elevated liver enzymes    Medications Reviewed Today     Reviewed by Beryle Lathe, [facility] (Pharmacist) on 07/04/21 at 1456  Med List Status: <None>   Medication Order Taking? Sig Documenting Provider Last Dose Status Informant  acetaminophen (TYLENOL) 650 MG CR tablet [id number] Yes Take 1,300 mg by mouth 2 (two) times daily. [provider] Taking Active Self           Med Note Sherren Mocha, TAMMY M   Tue Oct 29, 2018  1:45 PM) Patient takes 2 pills twice a day.  amLODipine (NORVASC) 5 MG tablet [id number] Yes  Take 1 tablet (5 mg total) by mouth daily. Kathyrn Drown, MD Taking Active   b complex vitamins tablet [id number] Yes Take 1 tablet by mouth daily. [provider] Taking Active Self  Calcium Carbonate-Vitamin D (CALCIUM 600 + D PO) [id number] Yes Take 1 tablet by mouth daily. [provider] Taking Active Self  Cholecalciferol (VITAMIN D3) 50 MCG (2000 UT) capsule [id number] Yes Take 5,000 Units by mouth. Once a week [provider] Taking Active Self           Med Note Waldo Laine, Glenden Rossell  J   Mon Mar 28, 2021  2:53 PM) On Fridays  docusate sodium (COLACE) 100 MG capsule [id number] Yes Take 200 mg by mouth at bedtime. [provider] Taking Active Self           Med Note Celesta Aver, [facility] Ronald Lobo Jun 27, 2021  9:33 AM) One prn  FIBER ADULT GUMMIES PO [id number] Yes Take by mouth. One bid [provider] Taking Active   gabapentin (NEURONTIN) 300 MG capsule [id number] Yes Take 1 capsule (300 mg total) by mouth 2 (two) times daily. 3rd dose if needed Kathyrn Drown, MD Taking Active   HYDROmorphone (DILAUDID) 2 MG tablet [id number] Yes One po three times daily as needed Kathyrn Drown, MD Taking Active            Med Note Grayland Ormond   Tue Nov 02, 2020  2:17 PM) Patient states that she seldom takes this medication.  hydroxychloroquine (PLAQUENIL) 200 MG tablet [id number] Yes Take 1 tablet (200 mg total) by mouth 2 (two) times daily. Kathyrn Drown, MD Taking Active Self  levothyroxine (SYNTHROID) 112 MCG tablet [id number] Yes Take 1 tablet (112 mcg total) by mouth daily. Kathyrn Drown, MD Taking Active   Lysine 500 MG TABS [id number] Yes Take 1 tablet by mouth daily as needed. [provider] Taking Active Self  Multiple Vitamin (MULTIVITAMIN WITH MINERALS) TABS [id number] Yes Take 1 tablet by mouth daily. [provider] Taking Active Self  olmesartan (BENICAR) 5 MG tablet [id number] Yes Take 1 tablet (5 mg total) by mouth daily. Kathyrn Drown, MD Taking Active   ondansetron [facility]) 4 MG tablet [id number] Yes Take 4 mg by mouth every 8 (eight) hours as needed for nausea or vomiting. [provider] Taking Active            Med Note Langston Masker, Doyle Askew   Tue Jun 21, 2021 11:49 AM) Takes Rarely.  pantoprazole (PROTONIX) 40 MG tablet [id number] Yes Take 1 tablet (40 mg total) by mouth daily before breakfast. Kathyrn Drown, MD Taking Active   polyethylene glycol (MIRALAX / GLYCOLAX) packet [id number] Yes Take 17 g by mouth daily as needed for mild constipation. Constipation [provider] Taking Active Self           Med Note (TODD, TAMMY M   Tue Oct 29, 2018  1:50 PM) Every third day as needed.  polyethylene glycol-electrolytes (TRILYTE) 420 g solution [id number] Yes Take 4,000 mLs by mouth as directed. Rogene Houston, MD Taking Active   pravastatin (PRAVACHOL) 20 MG tablet [id number] Yes Take 1 tablet (20 mg total) by mouth daily. Kathyrn Drown, MD Taking Active            Med Note Rhea Belton Mar 28, 2021  2:50 PM) Takes three times per week  predniSONE (DELTASONE) 2.5 MG tablet [id number] Yes Take 7.5 mg by mouth daily with breakfast. [provider] Taking Active Self  Probiotic Product (PROBIOTIC DAILY PO) [id number] Yes Take by mouth daily. [provider] Taking Active Self  promethazine (PHENERGAN) 25 MG tablet [id number] Yes Take 1 tablet by mouth twice a day as needed for nausea or vomiting  Patient taking differently: Take 12.5 mg by mouth at bedtime as needed.   Kathyrn Drown, MD Taking Active   Propylene Glycol (SYSTANE BALANCE OP) [id number] Yes Apply to eye 2 (two) times daily. Patient states that she uses 1 drop  in each eye as needed [provider] Taking Active Self           Med Note Grayland Ormond   Tue Nov 02, 2020  2:21 PM) Patient states that she uses as needed during the day  timolol (TIMOPTIC) 0.5 % ophthalmic solution [id number] Yes Place 1  drop into both eyes daily. [provider] Taking Active Self           Med Note Sherren Mocha, Ronnette Juniper   Tue Nov 02, 2020  2:20 PM) Patient states that she is now using 1 drop in each eye daily.  traMADol (ULTRAM) 50 MG tablet [id number] Yes TAKE 1 TABLET BY MOUTH 4 TIMES AS NEEDED. Kathyrn Drown, MD Taking Active   Zoledronic Acid (RECLAST IV) [id number] Yes Inject into the vein. yearly [provider] Taking Active Self           Med Note Langston Masker, Doyle Askew   Tue Jun 21, 2021 11:51 AM) Pt states she is not doing the Reclast in 2023.            Patient Active Problem List   Diagnosis Date Noted   History of colonic polyps 11/02/2020   Constipation 10/28/2019   LFTs abnormal 12/20/2017   Leukopenia 12/20/2017   Hyponatremia 12/20/2017   [place] spotted fever 12/20/2017   Osteopenia 02/16/2014   Hyperlipidemia 04/22/2013   Hypothyroidism 04/22/2013   GERD (gastroesophageal reflux disease) 01/01/2013   Rheumatoid arthritis ([place]) 12/20/2012   Essential hypertension, benign 12/20/2012   Chronic pain syndrome 12/20/2012    Immunization History  Administered Date(s) Administered   Fluad Quad(high Dose 65+) 05/17/2020, 05/04/2021   Influenza, Seasonal, Injecte, Preservative Fre 06/05/2012   Influenza,inj,Quad PF,6+ Mos 04/22/2013, 05/28/2014, 05/21/2015, 05/15/2016, 05/16/2017, 07/09/2018   Influenza-Unspecified 05/08/2019   Moderna Sars-Covid-2 Vaccination 09/18/2019, 10/17/2019, 07/19/2020   Pneumococcal Conjugate-13 02/16/2014   Pneumococcal Polysaccharide-23 01/04/2010, 11/15/2015   Tdap 06/16/2020   Unspecified SARS-COV-2 Vaccination 05/25/2021   Zoster Recombinat (Shingrix) 08/28/2018, 02/20/2019    Conditions to be addressed/monitored: HTN, HLD, Hypothyroidism, Osteopenia, and RA  Care Plan : Medication Management  Updates made by Beryle Lathe, [place] since 07/04/2021 12:00 AM     Problem: Hypertension, Hyperlipidemia, Rheumatoid  Arthritis, Osteopenia, and Hypothyroidism   Priority: High  Onset Date: 03/28/2021     Long-Range Goal: Disease Progression Prevention   Start Date: 03/28/2021  Expected End Date: 06/26/2021  Recent Progress: On track  Priority: High  Note:   Current Barriers:  Unable to achieve control of hypertension   Pharmacist Clinical Goal(s):  Over the next 90 days, patient will ahieve control of hypertension as evidenced by improved blood pressure control through collaboration with PharmD and provider.  Interventions: 1:1 collaboration with Kathyrn Drown, MD regarding development and update of comprehensive plan of care as evidenced by provider attestation and co-signature Inter-disciplinary care team collaboration (see longitudinal plan of care) Comprehensive medication review performed; medication list updated in electronic medical record  Hypertension: Current medications: amlodipine 5 mg by mouth once daily and olmesartan 5 mg by mouth daily BMP after starting olmesartan within normal limits Intolerances: none Taking medications as directed: yes Side effects thought to be attributed to current medication regimen: no Denies dizziness, lightheadedness, blurred vision, and headache. Does report having to get out of bed slow in the morning. Current exercise: severely limited due to RA and prior right ankle surgery Home blood pressure readings: 103/63 one day last week; patient did not have any  other home blood pressure values in office visit today Blood pressure control is unclear. Blood pressure high in office today but within normal limits at last office visit and per patient report are improving at home. Blood pressure goal is <130/80 mmHg per 2017 AHA/ACC guidelines. Continue current medications as above for now. May need to titrate olmesartan further but will follow-up with patient at next visit to review home blood pressure values. May be able to combine olmesartan with amlodipine as  (Azor) once on stable dose. Combination pill would require higher dose of olmesartan so will currently keep separate.  Encourage dietary sodium restriction/DASH diet Recommend home blood pressure monitoring, to bring results in next visit Discussed need for medication compliance Recommend that the patient take blood pressure mediations at bedtime, as opposed to upon waking, since there is some evidence that this may improve blood pressure control (decrease in asleep blood pressure and increased sleep-time relative blood pressure decline, i.e. BP dipping) and may decrease occurrence of major cardiovascular events (See HYGIA Chronotherapy Trial) Reviewed risks of hypertension, principles of treatment and consequences of untreated hypertension  Aortic Atherosclerosis/Hyperlipidemia: Current medications: pravastatin 20 mg by mouth three times per week (Tuesday, Thursday, and Saturday) Intolerances: none at this time Taking medications as directed: yes Side effects thought to be attributed to current medication regimen: no Controlled; LDL at goal of <100 due to high risk given at least 2 major CV risk factors (advancing age and hypertension) and 10-year risk 10-20% per 2020 AACE/ACE guidelines and TG at goal of <150 per 2020 AACE/ACE guidelines Continue pravastatin 20 mg by mouth three times per week (Tuesday, Thursday, and Saturday) Reviewed risks of hyperlipidemia, principles of treatment and consequences of untreated hyperlipidemia Discussed need for medication compliance Continue to monitor lipids and assess statin intolerance  Osteopenia (Managed by Dr.Aryal) Current medications: calcium + vitamin D.  Patient was on zoledronic acid (Reclast) 5 mg IV every 12 months for many years. Patient reports plan to take a bisphosphonate holiday per Dr. Kathlene November. Continue to monitor bone density every couple years. Intolerances: GI intolerance with risedronate Taking medications as directed: yes Side effects  thought to be attributed to current medication regimen: no Last DEXA (10/09/19) Falls reported in last year: unknown but patient is high risk Continue calcium + vitamin D Recommend 1,000 mg of elemental calcium per day from diet or supplementation. Supplemental calcium not necessary if appropriate amount obtained through diet. Recommend 800 - 2,000 IU of vitamin D supplementation per day to maintain adequate vitamin D levels. Most recent vitamin D level within normal limits   Rheumatoid Arthritis Current medications: hydroxychloroquine 200 mg by mouth twice daily and prednisone 7.5 mg by mouth daily  Continue management per Dr. Kathlene November  Hypothyroidism Current medication: levothyroxine 112 mcg by mouth daily TSH and Free T4 within normal limits   Patient Goals/Self-Care Activities Over the next 90  ays, patient will:  Take medications as prescribed Check blood pressure at least once daily, document, and provide at future appointments  Follow Up Plan: Face-to-face follow up appointment with care management team member scheduled for: 09/05/21      Medication Assistance: None required.  Patient affirms current coverage meets needs.  Patient's preferred pharmacy is:  [facility] [place], [place] [address] Phone: [phone] Fax: [phone]  [facility]) - [address], [place] [address] Phone:  [phone] Fax: [phone]  Follow Up:  Patient agrees to Care Plan and Follow-up.  Plan: Face to Face appointment with care management team member scheduled for: 09/05/21  Kennon Holter, PharmD, [place] Pharmacist [place] [phone]

## 2021-07-04 NOTE — Patient Instructions (Signed)
Sabrina Adams,  It was great to talk to you today!  Please call me with any questions or concerns.   Visit Information  Following are the goals we discussed today:  Patient Goals/Self-Care Activities Over the next 78  ays, patient will:  Take medications as prescribed Check blood pressure at least once daily, document, and provide at future appointments  Plan: Face to Face appointment with care management team member scheduled for: 09/05/21  Kennon Holter, PharmD, BCACP Clinical Pharmacist [place] [phone]  Please call the care guide team at [phone] if you need to cancel or reschedule your appointment.   Patient verbalizes understanding of instructions provided today and agrees to view in [place].

## 2021-07-05 ENCOUNTER — Encounter (INDEPENDENT_AMBULATORY_CARE_PROVIDER_SITE_OTHER): Payer: Self-pay

## 2021-07-05 ENCOUNTER — Other Ambulatory Visit: Payer: Self-pay

## 2021-07-05 MED ORDER — AMLODIPINE BESYLATE 5 MG PO TABS: 5.0000 mg | ORAL_TABLET | Freq: Every day | ORAL | 1 refills | Status: DC

## 2021-07-05 MED ORDER — GABAPENTIN 300 MG PO CAPS: 300.0000 mg | ORAL_CAPSULE | Freq: Two times a day (BID) | ORAL | 1 refills | Status: DC

## 2021-07-05 MED ORDER — LEVOTHYROXINE SODIUM 112 MCG PO TABS: 112.0000 ug | ORAL_TABLET | Freq: Every day | ORAL | 1 refills | Status: DC

## 2021-07-06 DIAGNOSIS — E782 Mixed hyperlipidemia: Secondary | ICD-10-CM

## 2021-07-06 DIAGNOSIS — M0579 Rheumatoid arthritis with rheumatoid factor of multiple sites without organ or systems involvement: Secondary | ICD-10-CM

## 2021-07-06 DIAGNOSIS — E038 Other specified hypothyroidism: Secondary | ICD-10-CM

## 2021-07-06 DIAGNOSIS — I10 Essential (primary) hypertension: Secondary | ICD-10-CM | POA: Diagnosis not present

## 2021-07-12 ENCOUNTER — Telehealth: Payer: Self-pay | Admitting: Family Medicine

## 2021-07-12 NOTE — Telephone Encounter (Signed)
Patient is requesting new prescription for tramadol 50 mg called into [place]

## 2021-07-12 NOTE — Telephone Encounter (Signed)
Please advise. Thank you

## 2021-07-13 ENCOUNTER — Other Ambulatory Visit: Payer: Self-pay | Admitting: Family Medicine

## 2021-07-13 MED ORDER — TRAMADOL HCL 50 MG PO TABS: ORAL_TABLET | ORAL | 1 refills | Status: DC

## 2021-07-13 NOTE — Telephone Encounter (Signed)
Refill was sent in as requested with an additional refill of this should last her till her follow-up visit in February hopefully if not we can send in additional refills later

## 2021-07-13 NOTE — Telephone Encounter (Signed)
Left message to return call 

## 2021-07-13 NOTE — Telephone Encounter (Signed)
Pt contacted and verbalized understanding.  

## 2021-08-02 NOTE — Patient Instructions (Signed)
Sabrina Adams  08/02/2021     @PREFPERIOPPHARMACY @   Your procedure is scheduled on  08/10/2020.   Report to [facility] at  0700  A.M.   Call this number if you have problems the morning of surgery:  [phone]   Remember:  Follow the diet and prep instructions given to you by the office.    Take these medicines the morning of surgery with A SIP OF WATER           amlodipine, gabapentin, hydromorphone or tramadol (if needed) levothyroxine, zofran, protonix, prednisone.     Do not wear jewelry, make-up or nail polish.  Do not wear lotions, powders, or perfumes, or deodorant.  Do not shave 48 hours prior to surgery.  Men may shave face and neck.  Do not bring valuables to the hospital.  [facility] is not responsible for any belongings or valuables.  Contacts, dentures or bridgework may not be worn into surgery.  Leave your suitcase in the car.  After surgery it may be brought to your room.  For patients admitted to the hospital, discharge time will be determined by your treatment team.  Patients discharged the day of surgery will not be allowed to drive home and must have someone with them for 24 hours.    Special instructions:   DO NOT smoke tobacco or vape for 24 hours before your procedure.  Please read over the following fact sheets that you were given. Anesthesia Post-op Instructions and Care and Recovery After Surgery      Colonoscopy, Adult, Care After This sheet gives you information about how to care for yourself after your procedure. Your health care provider may also give you more specific instructions. If you have problems or questions, contact your health care provider. What can I expect after the procedure? After the procedure, it is common to have: A small amount of blood in your stool for 24 hours after the procedure. Some gas. Mild cramping or bloating of your abdomen. Follow these instructions at home: Eating and drinking  Drink  enough fluid to keep your urine pale yellow. Follow instructions from your health care provider about eating or drinking restrictions. Resume your normal diet as instructed by your health care provider. Avoid heavy or fried foods that are hard to digest. Activity Rest as told by your health care provider. Avoid sitting for a long time without moving. Get up to take short walks every 1-2 hours. This is important to improve blood flow and breathing. Ask for help if you feel weak or unsteady. Return to your normal activities as told by your health care provider. Ask your health care provider what activities are safe for you. Managing cramping and bloating  Try walking around when you have cramps or feel bloated. Apply heat to your abdomen as told by your health care provider. Use the heat source that your health care provider recommends, such as a moist heat pack or a heating pad. Place a towel between your skin and the heat source. Leave the heat on for 20-30 minutes. Remove the heat if your skin turns bright red. This is especially important if you are unable to feel pain, heat, or cold. You may have a greater risk of getting burned. General instructions If you were given a sedative during the procedure, it can affect you for several hours. Do not drive or operate machinery until your health care provider says that it is safe. For  the first 24 hours after the procedure: Do not sign important documents. Do not drink alcohol. Do your regular daily activities at a slower pace than normal. Eat soft foods that are easy to digest. Take over-the-counter and prescription medicines only as told by your health care provider. Keep all follow-up visits as told by your health care provider. This is important. Contact a health care provider if: You have blood in your stool 2-3 days after the procedure. Get help right away if you have: More than a small spotting of blood in your stool. Large blood clots  in your stool. Swelling of your abdomen. Nausea or vomiting. A fever. Increasing pain in your abdomen that is not relieved with medicine. Summary After the procedure, it is common to have a small amount of blood in your stool. You may also have mild cramping and bloating of your abdomen. If you were given a sedative during the procedure, it can affect you for several hours. Do not drive or operate machinery until your health care provider says that it is safe. Get help right away if you have a lot of blood in your stool, nausea or vomiting, a fever, or increased pain in your abdomen. This information is not intended to replace advice given to you by your health care provider. Make sure you discuss any questions you have with your health care provider. Document Revised: 05/30/2019 Document Reviewed: 02/17/2019 Elsevier Patient Education  [place] After This sheet gives you information about how to care for yourself after your procedure. Your health care provider may also give you more specific instructions. If you have problems or questions, contact your health care provider. What can I expect after the procedure? After the procedure, it is common to have: Tiredness. Forgetfulness about what happened after the procedure. Impaired judgment for important decisions. Nausea or vomiting. Some difficulty with balance. Follow these instructions at home: For the time period you were told by your health care provider:   Rest as needed. Do not participate in activities where you could fall or become injured. Do not drive or use machinery. Do not drink alcohol. Do not take sleeping pills or medicines that cause drowsiness. Do not make important decisions or sign legal documents. Do not take care of children on your own. Eating and drinking Follow the diet that is recommended by your health care provider. Drink enough fluid to keep your urine pale  yellow. If you vomit: Drink water, juice, or soup when you can drink without vomiting. Make sure you have little or no nausea before eating solid foods. General instructions Have a responsible adult stay with you for the time you are told. It is important to have someone help care for you until you are awake and alert. Take over-the-counter and prescription medicines only as told by your health care provider. If you have sleep apnea, surgery and certain medicines can increase your risk for breathing problems. Follow instructions from your health care provider about wearing your sleep device: Anytime you are sleeping, including during daytime naps. While taking prescription pain medicines, sleeping medicines, or medicines that make you drowsy. Avoid smoking. Keep all follow-up visits as told by your health care provider. This is important. Contact a health care provider if: You keep feeling nauseous or you keep vomiting. You feel light-headed. You are still sleepy or having trouble with balance after 24 hours. You develop a rash. You have a fever. You have redness or swelling around  the IV site. Get help right away if: You have trouble breathing. You have new-onset confusion at home. Summary For several hours after your procedure, you may feel tired. You may also be forgetful and have poor judgment. Have a responsible adult stay with you for the time you are told. It is important to have someone help care for you until you are awake and alert. Rest as told. Do not drive or operate machinery. Do not drink alcohol or take sleeping pills. Get help right away if you have trouble breathing, or if you suddenly become confused. This information is not intended to replace advice given to you by your health care provider. Make sure you discuss any questions you have with your health care provider. Document Revised: 04/08/2020 Document Reviewed: 06/26/2019 Elsevier Patient Education  2022 [employer].

## 2021-08-03 ENCOUNTER — Telehealth (INDEPENDENT_AMBULATORY_CARE_PROVIDER_SITE_OTHER): Payer: Self-pay

## 2021-08-03 NOTE — Telephone Encounter (Signed)
Referring MD/PCP: Sallee Lange  Procedure: Tcs   Reason/Indication:  Hx of polyps  Has patient had this procedure before?  Yes   If so, when, by whom and where?  03/2016  Is there a family history of colon cancer?  no  Who?  What age when diagnosed?    Is patient diabetic? If yes, Type 1 or Type 2   no      Does patient have prosthetic heart valve or mechanical valve?  no  Do you have a pacemaker/defibrillator?  no  Has patient ever had endocarditis/atrial fibrillation? no  Does patient use oxygen? no  Has patient had joint replacement within last 12 months?  no  Is patient constipated or do they take laxatives? yes  Does patient have a history of alcohol/drug use?  no  Have you had a stroke/heart attack last 6 mths? no  Do you take medicine for weight loss?  no  For female patients,: have you had a hysterectomy or are you post menopausal and do you still have your menstrual cycle? no  Is patient on blood thinner such as Coumadin, Plavix and/or Aspirin? no  Medications: Tylenol 650 mg bid, amlodipine 2.5 mg daily, Vit B Complex daily, Calcium 600 + D bid, colacd 100 mg bid, fiber gummies bid, gabapentin 300 mg bid, hydromorphone 2 mg tid, plaquenil 200 mgd bid levothyroxine 112 mcg daily, mvi daily, pantoprazole 40 mg daily, miralax 17 g daily prn, pravastatin 20 mg 3 tabs per week, prednisone 2/5 mg bid, promethazine 25 1/2 tab daily prn, reclast inj yrly, Systane drops bid, timolol eye drops daily, tramadol 50 mg qid   Allergies: betadine, cinnamon, codeine, actonel, adhesive tape, lidocaine, methotrexate  Medication Adjustment per Dr Laural Golden none  Procedure date & time: 08/10/21 at 7:55 am

## 2021-08-04 ENCOUNTER — Encounter (HOSPITAL_COMMUNITY)
Admission: RE | Admit: 2021-08-04 | Discharge: 2021-08-04 | Disposition: A | Discharge status: Home / Self Care | Payer: Medicare Other | Source: Ambulatory Visit | Attending: Internal Medicine | Admitting: Internal Medicine

## 2021-08-04 ENCOUNTER — Encounter (HOSPITAL_COMMUNITY): Payer: Self-pay

## 2021-08-04 VITALS — BP 127/56 | HR 73 | Temp 97.7°F | Resp 18 | Ht 65.0 in | Wt 150.0 lb

## 2021-08-04 DIAGNOSIS — M8580 Other specified disorders of bone density and structure, unspecified site: Secondary | ICD-10-CM | POA: Diagnosis not present

## 2021-08-04 DIAGNOSIS — Z86010 Personal history of colonic polyps: Secondary | ICD-10-CM

## 2021-08-04 DIAGNOSIS — Z01818 Encounter for other preprocedural examination: Secondary | ICD-10-CM | POA: Diagnosis not present

## 2021-08-04 HISTORY — DX: Dislocation of jaw, unspecified side, initial encounter: S03.00XA

## 2021-08-04 LAB — CBC WITH DIFFERENTIAL/PLATELET
Abs Immature Granulocytes: 0.01 10*3/uL (ref 0.00–0.07)
Basophils Absolute: 0 10*3/uL (ref 0.0–0.1)
Basophils Relative: 0 %
Eosinophils Absolute: 0.1 10*3/uL (ref 0.0–0.5)
Eosinophils Relative: 2 %
HCT: 42.6 % (ref 36.0–46.0)
Hemoglobin: 13.6 g/dL (ref 12.0–15.0)
Immature Granulocytes: 0 %
Lymphocytes Relative: 32 %
Lymphs Abs: 1.5 10*3/uL (ref 0.7–4.0)
MCH: 32.1 pg (ref 26.0–34.0)
MCHC: 31.9 g/dL (ref 30.0–36.0)
MCV: 100.5 fL — ABNORMAL HIGH (ref 80.0–100.0)
Monocytes Absolute: 0.6 10*3/uL (ref 0.1–1.0)
Monocytes Relative: 13 %
Neutro Abs: 2.4 10*3/uL (ref 1.7–7.7)
Neutrophils Relative %: 53 %
Platelets: 253 10*3/uL (ref 150–400)
RBC: 4.24 MIL/uL (ref 3.87–5.11)
RDW: 14.1 % (ref 11.5–15.5)
WBC: 4.5 10*3/uL (ref 4.0–10.5)
nRBC: 0 % (ref 0.0–0.2)

## 2021-08-10 ENCOUNTER — Ambulatory Visit (HOSPITAL_COMMUNITY)
Admission: RE | Admit: 2021-08-10 | Discharge: 2021-08-10 | Disposition: A | Discharge status: Home / Self Care | Payer: Medicare Other | Attending: Internal Medicine | Admitting: Internal Medicine

## 2021-08-10 ENCOUNTER — Ambulatory Visit (HOSPITAL_COMMUNITY): Payer: Medicare Other | Admitting: Anesthesiology

## 2021-08-10 ENCOUNTER — Encounter (HOSPITAL_COMMUNITY)
Admission: RE | Disposition: A | Discharge status: Home / Self Care | Payer: Self-pay | Source: Home / Self Care | Attending: Internal Medicine

## 2021-08-10 ENCOUNTER — Encounter (HOSPITAL_COMMUNITY): Payer: Self-pay | Admitting: Internal Medicine

## 2021-08-10 DIAGNOSIS — Z980 Intestinal bypass and anastomosis status: Secondary | ICD-10-CM | POA: Diagnosis not present

## 2021-08-10 DIAGNOSIS — K5730 Diverticulosis of large intestine without perforation or abscess without bleeding: Secondary | ICD-10-CM | POA: Diagnosis not present

## 2021-08-10 DIAGNOSIS — E039 Hypothyroidism, unspecified: Secondary | ICD-10-CM | POA: Insufficient documentation

## 2021-08-10 DIAGNOSIS — K644 Residual hemorrhoidal skin tags: Secondary | ICD-10-CM | POA: Insufficient documentation

## 2021-08-10 DIAGNOSIS — Z86010 Personal history of colonic polyps: Secondary | ICD-10-CM | POA: Diagnosis not present

## 2021-08-10 DIAGNOSIS — K219 Gastro-esophageal reflux disease without esophagitis: Secondary | ICD-10-CM | POA: Diagnosis not present

## 2021-08-10 DIAGNOSIS — G4730 Sleep apnea, unspecified: Secondary | ICD-10-CM | POA: Diagnosis not present

## 2021-08-10 DIAGNOSIS — Z1211 Encounter for screening for malignant neoplasm of colon: Secondary | ICD-10-CM | POA: Diagnosis not present

## 2021-08-10 DIAGNOSIS — D649 Anemia, unspecified: Secondary | ICD-10-CM | POA: Insufficient documentation

## 2021-08-10 DIAGNOSIS — K635 Polyp of colon: Secondary | ICD-10-CM | POA: Diagnosis not present

## 2021-08-10 DIAGNOSIS — Z8673 Personal history of transient ischemic attack (TIA), and cerebral infarction without residual deficits: Secondary | ICD-10-CM | POA: Insufficient documentation

## 2021-08-10 DIAGNOSIS — K6389 Other specified diseases of intestine: Secondary | ICD-10-CM | POA: Diagnosis not present

## 2021-08-10 DIAGNOSIS — Z09 Encounter for follow-up examination after completed treatment for conditions other than malignant neoplasm: Secondary | ICD-10-CM | POA: Diagnosis not present

## 2021-08-10 DIAGNOSIS — I10 Essential (primary) hypertension: Secondary | ICD-10-CM | POA: Diagnosis not present

## 2021-08-10 HISTORY — PX: BIOPSY: SHX5522

## 2021-08-10 HISTORY — PX: COLONOSCOPY WITH PROPOFOL: SHX5780

## 2021-08-10 HISTORY — PX: POLYPECTOMY: SHX5525

## 2021-08-10 LAB — HM COLONOSCOPY

## 2021-08-10 SURGERY — COLONOSCOPY WITH PROPOFOL
Anesthesia: General

## 2021-08-10 MED ORDER — LACTATED RINGERS IV SOLN: INTRAVENOUS | Status: DC

## 2021-08-10 MED ORDER — PROPOFOL 1000 MG/100ML IV EMUL
INTRAVENOUS | Status: AC
  Filled 2021-08-10: qty 100

## 2021-08-10 MED ORDER — CHLORHEXIDINE GLUCONATE CLOTH 2 % EX PADS: 6.0000 | MEDICATED_PAD | Freq: Once | CUTANEOUS | Status: DC

## 2021-08-10 MED ORDER — PHENYLEPHRINE 40 MCG/ML (10ML) SYRINGE FOR IV PUSH (FOR BLOOD PRESSURE SUPPORT)
PREFILLED_SYRINGE | INTRAVENOUS | Status: AC
  Filled 2021-08-10: qty 10

## 2021-08-10 MED ORDER — PROPOFOL 500 MG/50ML IV EMUL
INTRAVENOUS | Status: DC | PRN
  Administered 2021-08-10: 150 ug/kg/min via INTRAVENOUS

## 2021-08-10 MED ORDER — LACTATED RINGERS IV SOLN
INTRAVENOUS | Status: DC | PRN
Start: 2021-08-10 — End: 2021-08-10

## 2021-08-10 MED ORDER — PROPOFOL 10 MG/ML IV BOLUS
INTRAVENOUS | Status: DC | PRN
  Administered 2021-08-10: 60 mg via INTRAVENOUS

## 2021-08-10 NOTE — Transfer of Care (Signed)
Immediate Anesthesia Transfer of Care Note  Patient: Sabrina Adams  Procedure(s) Performed: COLONOSCOPY WITH PROPOFOL POLYPECTOMY BIOPSY  Patient Location: PACU  Anesthesia Type:General  Level of Consciousness: awake, alert , oriented and patient cooperative  Airway & Oxygen Therapy: Patient Spontanous Breathing  Post-op Assessment: Report given to RN, Post -op Vital signs reviewed and stable and Patient moving all extremities X 4  Post vital signs: Reviewed stable  Last Vitals:  Vitals Value Taken Time  BP    Temp    Pulse    Resp    SpO2      Last Pain:  Vitals:   08/10/21 0822  TempSrc:   PainSc: 3       Patients Stated Pain Goal: 4 (79/39/03 0092)  Complications: No notable events documented.

## 2021-08-10 NOTE — Anesthesia Postprocedure Evaluation (Signed)
Anesthesia Post Note  Patient: Sabrina Adams  Procedure(s) Performed: COLONOSCOPY WITH PROPOFOL POLYPECTOMY BIOPSY  Patient location during evaluation: Phase II Anesthesia Type: General Level of consciousness: awake Pain management: pain level controlled Vital Signs Assessment: post-procedure vital signs reviewed and stable Respiratory status: spontaneous breathing and respiratory function stable Cardiovascular status: blood pressure returned to baseline and stable Postop Assessment: no headache and no apparent nausea or vomiting Anesthetic complications: no Comments: Late entry   No notable events documented.   Last Vitals:  Vitals:   08/10/21 0712 08/10/21 0846  BP: (!) 157/74 130/61  Pulse: (!) 55 63  Resp: 14 16  Temp: 36.8 C 36.6 C  SpO2: 100% 98%    Last Pain:  Vitals:   08/10/21 0846  TempSrc: Axillary  PainSc: 0-No pain                 Louann Sjogren

## 2021-08-10 NOTE — Op Note (Signed)
[facility] Patient Name: Sabrina Adams Procedure Date: 08/10/2021 7:56 AM MRN: [id number] Date of Birth: 03-11-1946 Attending MD: Hildred Laser , MD CSN: [id number] Age: 76 Admit Type: Outpatient Procedure:                Colonoscopy Indications:              High risk colon cancer surveillance: Personal                            history of colonic polyps Providers:                Hildred Laser, MD, Charlsie Quest. Theda Sers RN, RN,                            Raphael Gibney, Technician Referring MD:             Sallee Lange, MD Medicines:                Propofol per Anesthesia Complications:            No immediate complications. Estimated Blood Loss:     Estimated blood loss was minimal. Procedure:                Pre-Anesthesia Assessment:                           - Prior to the procedure, a History and Physical                            was performed, and patient medications and                            allergies were reviewed. The patient's tolerance of                            previous anesthesia was also reviewed. The risks                            and benefits of the procedure and the sedation                            options and risks were discussed with the patient.                            All questions were answered, and informed consent                            was obtained. Prior Anticoagulants: The patient has                            taken no previous anticoagulant or antiplatelet                            agents. ASA Grade Assessment: III - A patient with  severe systemic disease. After reviewing the risks                            and benefits, the patient was deemed in                            satisfactory condition to undergo the procedure.                           After obtaining informed consent, the colonoscope                            was passed under direct vision. Throughout the                             procedure, the patient's blood pressure, pulse, and                            oxygen saturations were monitored continuously. The                            [id number]-HQ190L ([id number]) scope was introduced through                            the anus and advanced to the the cecum, identified                            by appendiceal orifice and ileocecal valve. The                            colonoscopy was performed without difficulty. The                            patient tolerated the procedure well. The quality                            of the bowel preparation was good. The ileocecal                            valve, appendiceal orifice, and rectum were                            photographed. Scope In: 8:23:13 AM Scope Out: 8:40:43 AM Scope Withdrawal Time: 0 hours 12 minutes 22 seconds  Total Procedure Duration: 0 hours 17 minutes 30 seconds  Findings:      The perianal and digital rectal examinations were normal.      A diminutive polyp was found in the hepatic flexure. Biopsies were taken       with a cold forceps for histology. This polyp was lost.      Scattered diverticula were found in the sigmoid colon, descending colon       and hepatic flexure.      A localized area of mildly erythematous and eroded mucosa was found in       the descending colon. Biopsies  were taken with a cold forceps for       histology. The pathology specimen was placed into Bottle Number 1.      Scattered diverticula were found in the sigmoid colon, descending colon       and hepatic flexure.      There was evidence of a prior end-to-side colo-rectal anastomosis in the       mid rectum. This was patent and was characterized by healthy appearing       mucosa.      External hemorrhoids were found during retroflexion. The hemorrhoids       were small. Impression:               - One diminutive polyp at the hepatic flexure.                            Biopsied.                           - Diverticulosis in  the sigmoid colon, in the                            descending colon and at the hepatic flexure.                           - Erythematous and eroded mucosa in the descending                            colon. Biopsied.                           - Diverticulosis in the sigmoid colon, in the                            descending colon and at the hepatic flexure.                           - Patent end-to-side colo-rectal anastomosis,                            characterized by healthy appearing mucosa.                           - Small external hemorrhoids. Moderate Sedation:      Per Anesthesia Care Recommendation:           - Patient has a contact number available for                            emergencies. The signs and symptoms of potential                            delayed complications were discussed with the                            patient. Return to normal activities tomorrow.  Written discharge instructions were provided to the                            patient.                           - High fiber diet today.                           - Continue present medications.                           - No aspirin, ibuprofen, naproxen, or other                            non-steroidal anti-inflammatory drugs for 1 day.                           - Await pathology results.                           - No recommendation at this time regarding repeat                            colonoscopy.                           - Procedure Code(s):        --- Professional ---                           [id number], Colonoscopy, flexible; with biopsy, single                            or multiple Diagnosis Code(s):        --- Professional ---                           Z86.010, Personal history of colonic polyps                           K63.5, Polyp of colon                           K63.89, Other specified diseases of intestine                           Z98.0, Intestinal bypass  and anastomosis status                           K57.30, Diverticulosis of large intestine without                            perforation or abscess without bleeding CPT copyright 2019 American [employer]. All rights reserved. The codes documented in this report are preliminary and upon coder review may  be revised to meet current compliance requirements. Hildred Laser, MD Hildred Laser, MD 08/10/2021 8:54:02 AM This report has been  signed electronically. Number of Addenda: 0

## 2021-08-10 NOTE — Discharge Instructions (Addendum)
No aspirin or NSAIDs for 24 hours.   °Resume usual medications as before. °High-fiber diet. °No driving for 24 hours. °Physician will call with biopsy results. °

## 2021-08-10 NOTE — H&P (Signed)
Sabrina Adams is an 76 y.o. female.   Chief Complaint: Patient is here for colonoscopy. HPI: Patient is 76 year old Caucasian female was history of colonic adenomas and is here for surveillance colonoscopy.  She denies change in bowel habits or rectal bleeding.  She was seen in the office in November 2022 and had abdominal pelvic CT as there was concern for diverticulitis but she did not.  The study revealed large hiatal hernia.  She has had symptoms of GERD and says heartburn is well controlled with therapy. Her appetite is good.  She denies involuntary weight loss. Family history is negative for colorectal carcinoma. Last colonoscopy was in August 2017 with removal of 12 mm tubular adenoma.   Past Medical History:  Diagnosis Date   Anemia    Arthritis    rheumatoid   Complication of anesthesia    nausea ,  psycotics episodes   GERD (gastroesophageal reflux disease)    Hypertension    Hypothyroidism    Neuromuscular disorder (HCC)    neuropathy  legs   PONV (postoperative nausea and vomiting)    RA (rheumatoid arthritis) ([place])    Sleep apnea    Stroke ([place])    mild   TMJ (dislocation of temporomandibular joint)     Past Surgical History:  Procedure Laterality Date   ABDOMINAL HYSTERECTOMY  2005   ABDOMINAL SURGERY     2006 colectomy for diverticulitis   ANKLE SURGERY     COLON SURGERY     colostomy then revision   COLONOSCOPY  04/04/2012   Procedure: COLONOSCOPY;  Surgeon: Rogene Houston, MD;  Location: [facility];  Service: Endoscopy;  Laterality: N/A;  410-Ok per [place]   COLONOSCOPY N/A 03/23/2016   Procedure: COLONOSCOPY;  Surgeon: Rogene Houston, MD;  Location: [facility];  Service: Endoscopy;  Laterality: N/A;  2:50   ESOPHAGOGASTRODUODENOSCOPY  04/04/2012   Procedure: ESOPHAGOGASTRODUODENOSCOPY (EGD);  Surgeon: Rogene Houston, MD;  Location: [facility];  Service: Endoscopy;  Laterality: N/A;  possible   HAND ARTHROPLASTY     LUMBAR SPINE SURGERY  2005    TONSILLECTOMY      Family History  Problem Relation Age of Onset   Hypertension Mother    Hypertension Father    Heart attack Father    Heart disease Father    Social History:  reports that she has never smoked. She has never used smokeless tobacco. She reports current alcohol use. She reports that she does not use drugs.  Allergies:  Allergies  Allergen Reactions   Betadine [Povidone Iodine] Itching    "Burns"   Cinnamon Anaphylaxis    Throat and Tongue    Codeine Nausea Only    Patient can become woozy   Other Nausea And Vomiting and Other (See Comments)    Pt states "all narcotic pain medications make me sick to my stomach and sometimes make me woozy   Actonel [Risedronate Sodium]     Stomach upset   Adhesive [Tape] Other (See Comments)    Irritates skin    Lidocaine     Causes vasoconstriction per patient   Methotrexate Derivatives     Elevated liver enzymes    Medications Prior to Admission  Medication Sig Dispense Refill   acetaminophen (TYLENOL) 650 MG CR tablet Take 1,300 mg by mouth 2 (two) times daily.     amLODipine (NORVASC) 5 MG tablet Take 1 tablet (5 mg total) by mouth daily. 90 tablet 1   b complex vitamins  tablet Take 1 tablet by mouth daily.     Calcium Carbonate-Vitamin D (CALCIUM 600 + D PO) Take 1 tablet by mouth daily.     Cholecalciferol (VITAMIN D3) 125 MCG (5000 UT) CAPS Take 5,000 Units by mouth once a week.     docusate sodium (COLACE) 100 MG capsule Take 200 mg by mouth 2 (two) times daily.     gabapentin (NEURONTIN) 300 MG capsule Take 1 capsule (300 mg total) by mouth 2 (two) times daily. 3rd dose if needed (Patient taking differently: Take 300 mg by mouth 3 (three) times daily.) 270 capsule 1   hydrocortisone 1 % ointment Apply 1 application topically 2 (two) times daily as needed for itching.     hydroxychloroquine (PLAQUENIL) 200 MG tablet Take 1 tablet (200 mg total) by mouth 2 (two) times daily. 180 tablet 1   levothyroxine (SYNTHROID)  112 MCG tablet Take 1 tablet (112 mcg total) by mouth daily. 90 tablet 1   Lysine 500 MG TABS Take 500 mg by mouth daily as needed.     Multiple Vitamin (MULTIVITAMIN WITH MINERALS) TABS Take 1 tablet by mouth daily.     olmesartan (BENICAR) 5 MG tablet Take 1 tablet (5 mg total) by mouth daily. 30 tablet 2   ondansetron (ZOFRAN) 4 MG tablet Take 4 mg by mouth every 8 (eight) hours as needed for nausea or vomiting.     pantoprazole (PROTONIX) 40 MG tablet Take 1 tablet (40 mg total) by mouth daily before breakfast. 90 tablet 1   polycarbophil (FIBERCON) 625 MG tablet Take 625 mg by mouth in the morning and at bedtime.     polyethylene glycol (MIRALAX / GLYCOLAX) packet Take 17 g by mouth daily as needed for mild constipation. Constipation     polyethylene glycol-electrolytes (TRILYTE) 420 g solution Take 4,000 mLs by mouth as directed. 4000 mL 0   pravastatin (PRAVACHOL) 20 MG tablet Take 1 tablet (20 mg total) by mouth daily. (Patient taking differently: Take 20 mg by mouth 3 (three) times a week.) 30 tablet 5   predniSONE (DELTASONE) 2.5 MG tablet Take 7.5 mg by mouth daily with breakfast.     Probiotic Product (PROBIOTIC DAILY PO) Take 1 capsule by mouth daily.     promethazine (PHENERGAN) 25 MG tablet Take 1 tablet by mouth twice a day as needed for nausea or vomiting (Patient taking differently: Take 12.5 mg by mouth at bedtime as needed.) 30 tablet 4   Propylene Glycol (SYSTANE BALANCE OP) Place 1 drop into both eyes 2 (two) times daily.     terbinafine (LAMISIL) 1 % cream Apply 1 application topically 2 (two) times daily as needed (antifungal).     timolol (TIMOPTIC) 0.5 % ophthalmic solution Place 1 drop into both eyes daily.     traMADol (ULTRAM) 50 MG tablet TAKE 1 TABLET BY MOUTH 4 TIMES AS NEEDED. 120 tablet 1   Zoledronic Acid (RECLAST IV) Inject into the vein. yearly     HYDROmorphone (DILAUDID) 2 MG tablet One po three times daily as needed (Patient taking differently: Take 2 mg by  mouth daily as needed for severe pain.) 90 tablet 0    No results found for this or any previous visit (from the past 48 hour(s)). No results found.  Review of Systems  Blood pressure (!) 157/74, pulse (!) 55, temperature 98.3 F (36.8 C), temperature source Oral, resp. rate 14, SpO2 100 %. Physical Exam HENT:     Mouth/Throat:  Mouth: Mucous membranes are moist.     Pharynx: Oropharynx is clear.  Eyes:     General: No scleral icterus.    Conjunctiva/sclera: Conjunctivae normal.  Cardiovascular:     Rate and Rhythm: Normal rate and regular rhythm.     Heart sounds: Normal heart sounds. No murmur heard. Pulmonary:     Effort: Pulmonary effort is normal.     Breath sounds: Normal breath sounds.  Abdominal:     Comments: Abdomen is symmetrical.  She has scar in left mid abdomen site of prior colostomy and midline scar.  Abdomen is soft and nontender with organomegaly or masses.  Musculoskeletal:        General: No swelling.     Cervical back: Neck supple.  Lymphadenopathy:     Cervical: No cervical adenopathy.  Skin:    General: Skin is warm and dry.  Neurological:     Mental Status: She is alert.     Assessment/Plan  History of colonic adenomas. Surveillance colonoscopy.  Hildred Laser, MD 08/10/2021, 8:15 AM

## 2021-08-10 NOTE — Anesthesia Preprocedure Evaluation (Signed)
Anesthesia Evaluation  Patient identified by MRN, date of birth, ID band Patient awake    Reviewed: Allergy & Precautions, H&P , NPO status , Patient's Chart, lab work & pertinent test results, reviewed documented beta blocker date and time   History of Anesthesia Complications (+) PONV and history of anesthetic complications  Airway Mallampati: II  TM Distance: >3 FB Neck ROM: full    Dental no notable dental hx.    Pulmonary sleep apnea ,    Pulmonary exam normal breath sounds clear to auscultation       Cardiovascular Exercise Tolerance: Good hypertension, negative cardio ROS   Rhythm:regular Rate:Normal     Neuro/Psych TIA Neuromuscular disease (Rheumatoid Arthritis) negative psych ROS   GI/Hepatic Neg liver ROS, GERD  Medicated,  Endo/Other  Hypothyroidism   Renal/GU negative Renal ROS  negative genitourinary   Musculoskeletal   Abdominal   Peds  Hematology  (+) Blood dyscrasia, anemia ,   Anesthesia Other Findings   Reproductive/Obstetrics negative OB ROS                             Anesthesia Physical Anesthesia Plan  ASA: 3  Anesthesia Plan: General   Post-op Pain Management:    Induction:   PONV Risk Score and Plan: Propofol infusion  Airway Management Planned:   Additional Equipment:   Intra-op Plan:   Post-operative Plan:   Informed Consent: I have reviewed the patients History and Physical, chart, labs and discussed the procedure including the risks, benefits and alternatives for the proposed anesthesia with the patient or authorized representative who has indicated his/her understanding and acceptance.     Dental Advisory Given  Plan Discussed with: CRNA  Anesthesia Plan Comments:         Anesthesia Quick Evaluation

## 2021-08-11 ENCOUNTER — Other Ambulatory Visit: Payer: Self-pay | Admitting: Family Medicine

## 2021-08-11 DIAGNOSIS — I10 Essential (primary) hypertension: Secondary | ICD-10-CM

## 2021-08-12 LAB — SURGICAL PATHOLOGY

## 2021-08-16 ENCOUNTER — Encounter (HOSPITAL_COMMUNITY): Payer: Self-pay | Admitting: Internal Medicine

## 2021-08-16 ENCOUNTER — Encounter (INDEPENDENT_AMBULATORY_CARE_PROVIDER_SITE_OTHER): Payer: Self-pay | Admitting: *Deleted

## 2021-08-21 ENCOUNTER — Encounter: Payer: Self-pay | Admitting: Family Medicine

## 2021-08-21 DIAGNOSIS — I10 Essential (primary) hypertension: Secondary | ICD-10-CM

## 2021-08-22 MED ORDER — PROMETHAZINE HCL 25 MG PO TABS: ORAL_TABLET | ORAL | 2 refills | Status: DC

## 2021-08-22 MED ORDER — OLMESARTAN MEDOXOMIL 5 MG PO TABS: 5.0000 mg | ORAL_TABLET | Freq: Every day | ORAL | 2 refills | Status: DC

## 2021-08-22 MED ORDER — AMLODIPINE BESYLATE 5 MG PO TABS: 5.0000 mg | ORAL_TABLET | Freq: Every day | ORAL | 2 refills | Status: DC

## 2021-08-22 NOTE — Telephone Encounter (Signed)
Nurses-I am fine with 90 days on these medications with 2 additional refills.  Please send them to her new pharmacy.  Thank you

## 2021-08-23 ENCOUNTER — Other Ambulatory Visit: Payer: Self-pay | Admitting: Family Medicine

## 2021-08-25 DIAGNOSIS — H16223 Keratoconjunctivitis sicca, not specified as Sjogren's, bilateral: Secondary | ICD-10-CM | POA: Diagnosis not present

## 2021-08-25 DIAGNOSIS — H01001 Unspecified blepharitis right upper eyelid: Secondary | ICD-10-CM | POA: Diagnosis not present

## 2021-08-25 DIAGNOSIS — H524 Presbyopia: Secondary | ICD-10-CM | POA: Diagnosis not present

## 2021-08-25 DIAGNOSIS — H401121 Primary open-angle glaucoma, left eye, mild stage: Secondary | ICD-10-CM | POA: Diagnosis not present

## 2021-08-25 DIAGNOSIS — H01002 Unspecified blepharitis right lower eyelid: Secondary | ICD-10-CM | POA: Diagnosis not present

## 2021-09-05 ENCOUNTER — Other Ambulatory Visit: Payer: Self-pay

## 2021-09-05 ENCOUNTER — Ambulatory Visit (INDEPENDENT_AMBULATORY_CARE_PROVIDER_SITE_OTHER): Payer: Medicare Other | Admitting: Pharmacist

## 2021-09-05 VITALS — BP 145/76 | HR 63

## 2021-09-05 DIAGNOSIS — E038 Other specified hypothyroidism: Secondary | ICD-10-CM

## 2021-09-05 DIAGNOSIS — E782 Mixed hyperlipidemia: Secondary | ICD-10-CM

## 2021-09-05 DIAGNOSIS — M0579 Rheumatoid arthritis with rheumatoid factor of multiple sites without organ or systems involvement: Secondary | ICD-10-CM

## 2021-09-05 DIAGNOSIS — I10 Essential (primary) hypertension: Secondary | ICD-10-CM

## 2021-09-05 DIAGNOSIS — M8580 Other specified disorders of bone density and structure, unspecified site: Secondary | ICD-10-CM

## 2021-09-05 NOTE — Chronic Care Management (AMB) (Signed)
Chronic Care Management Pharmacy Note  09/05/2021 Name:  Sabrina Adams MRN:  [id number] DOB:  25-Sep-1945  Summary: Hypertension Blood pressure control is unclear. High today, but pt reports normal blood pressures when monitors outside of office. Blood pressure is at goal of <140/90 mmHg per 2022 AAFP guidelines Current home blood pressure:  patient brought home blood pressure log from last 2 months. Most blood pressure has been 110-130s/60-80 with one outlier of SBP in the 140s Continue olmesartan 5 mg by mouth once daily and amlodipine 5 mg by mouth once daily  Subjective: Sabrina Adams is an 76 y.o. year old female who is a primary patient of Luking, Elayne Snare, MD.  The CCM team was consulted for assistance with disease management and care coordination needs.    Engaged with patient face to face for follow up visit in response to provider referral for pharmacy case management and/or care coordination services.   Consent to Services:  The patient was given information about Chronic Care Management services, agreed to services, and gave verbal consent prior to initiation of services.  Please see initial visit note for detailed documentation.   Patient Care Team: Kathyrn Drown, MD as PCP - General (Family Medicine) Beryle Lathe, [facility] (Pharmacist)  Objective:  Lab Results  Component Value Date   CREATININE 0.86 06/08/2021   CREATININE 0.78 11/14/2019   CREATININE 0.73 06/05/2018    Lab Results  Component Value Date   HGBA1C 5.9 (H) 11/16/2015   Last diabetic Eye exam:  Lab Results  Component Value Date/Time   HMDIABEYEEXA No Retinopathy 02/17/2021 12:00 AM    Last diabetic Foot exam: No results found for: HMDIABFOOTEX      Component Value Date/Time   CHOL 198 03/18/2021 1427   TRIG 118 03/18/2021 1427   HDL 90 03/18/2021 1427   CHOLHDL 2.2 03/18/2021 1427   CHOLHDL 3.3 09/29/2013 1248   VLDL 41 (H) 09/29/2013 1248   LDLCALC 88 03/18/2021 1427     Hepatic Function Latest Ref Rng & Units 11/14/2019 01/09/2018 12/23/2017  Total Protein 6.0 - 8.5 g/dL 6.6 7.2 5.3(L)  Albumin 3.7 - 4.7 g/dL 4.5 4.2 2.5(L)  AST 0 - 40 IU/L 35 33 145(H)  ALT 0 - 32 IU/L 19 22 71(H)  Alk Phosphatase 39 - 117 IU/L 72 111 116  Total Bilirubin 0.0 - 1.2 mg/dL 0.4 0.4 0.7  Bilirubin, Direct 0.00 - 0.40 mg/dL 0.12 0.11 -    Lab Results  Component Value Date/Time   TSH 1.170 03/18/2021 02:27 PM   TSH 2.620 06/22/2020 09:22 AM   FREET4 1.64 03/18/2021 02:27 PM    CBC Latest Ref Rng & Units 08/04/2021 06/05/2018 01/09/2018  WBC 4.0 - 10.5 K/uL 4.5 4.1 5.9  Hemoglobin 12.0 - 15.0 g/dL 13.6 14.9 14.4  Hematocrit 36.0 - 46.0 % 42.6 44.2 42.8  Platelets 150 - 400 K/uL 253 254 286    Lab Results  Component Value Date/Time   VD25OH 45.2 03/18/2021 02:27 PM   VD25OH 43.8 11/14/2019 12:38 PM    Clinical ASCVD: No  The 10-year ASCVD risk score (Arnett DK, et al., 2019) is: 20.9%   Values used to calculate the score:     Age: 76 years     Sex: Female     Is Non-Hispanic African American: No     Diabetic: No     Tobacco smoker: No     Systolic Blood Pressure: 096 mmHg     Is BP  treated: Yes     HDL Cholesterol: 90 mg/dL     Total Cholesterol: 198 mg/dL    Social History   Tobacco Use  Smoking Status Never  Smokeless Tobacco Never   BP Readings from Last 3 Encounters:  09/05/21 (!) 145/76  08/10/21 130/61  08/04/21 (!) 127/56   Pulse Readings from Last 3 Encounters:  09/05/21 63  08/10/21 63  08/04/21 73   Wt Readings from Last 3 Encounters:  08/04/21 150 lb (68 kg)  06/27/21 148 lb 6.4 oz (67.3 kg)  06/21/21 148 lb (67.1 kg)    Assessment: Review of patient past medical history, allergies, medications, health status, including review of consultants reports, laboratory and other test data, was performed as part of comprehensive evaluation and provision of chronic care management services.   SDOH:  (Social Determinants of Health)  assessments and interventions performed:    CCM Care Plan  Allergies  Allergen Reactions   Betadine [Povidone Iodine] Itching    "Burns"   Cinnamon Anaphylaxis    Throat and Tongue    Codeine Nausea Only    Patient can become woozy   Other Nausea And Vomiting and Other (See Comments)    Pt states "all narcotic pain medications make me sick to my stomach and sometimes make me woozy   Actonel [Risedronate Sodium]     Stomach upset   Adhesive [Tape] Other (See Comments)    Irritates skin    Lidocaine     Causes vasoconstriction per patient   Methotrexate Derivatives     Elevated liver enzymes    Medications Reviewed Today     Reviewed by Beryle Lathe, [facility] (Pharmacist) on 09/05/21 at 1456  Med List Status: <None>   Medication Order Taking? Sig Documenting Provider Last Dose Status Informant  acetaminophen (TYLENOL) 650 MG CR tablet [id number] Yes Take 1,300 mg by mouth 2 (two) times daily. [provider] Taking Active Self           Med Note Wilmon Pali, MELISSA R   Wed Jul 27, 2021  3:20 PM)    amLODipine (NORVASC) 5 MG tablet [id number] Yes Take 1 tablet (5 mg total) by mouth daily. Kathyrn Drown, MD Taking Active   b complex vitamins tablet [id number] Yes Take 1 tablet by mouth daily. [provider] Taking Active Self  Calcium Carbonate-Vitamin D (CALCIUM 600 + D PO) [id number] Yes Take 1 tablet by mouth daily. [provider] Taking Active Self  Cholecalciferol (VITAMIN D3) 125 MCG (5000 UT) CAPS [id number] Yes Take 5,000 Units by mouth once a week. [provider] Taking Active Self           Med Note Wilmon Pali, MELISSA R   Wed Jul 27, 2021  3:21 PM)    docusate sodium (COLACE) 100 MG capsule [id number] Yes Take 200 mg by mouth 2 (two) times daily. [provider] Taking Active Self           Med Note Celesta Aver, [facility] Ronald Lobo Jun 27, 2021  9:33 AM) One prn  gabapentin (NEURONTIN) 300 MG capsule [id number] Yes Take 1 capsule (300  mg total) by mouth 2 (two) times daily. 3rd dose if needed  Patient taking differently: Take 300 mg by mouth 3 (three) times daily.   Kathyrn Drown, MD Taking Active Self  hydrocortisone 1 % ointment [id number] Yes Apply 1 application topically 2 (two) times daily as needed for itching. [provider] Taking Active Self  HYDROmorphone (DILAUDID)  2 MG tablet [id number] Yes One po three times daily as needed  Patient taking differently: Take 2 mg by mouth daily as needed for severe pain.   Kathyrn Drown, MD Taking Active Self           Med Note Wilmon Pali, MELISSA R   Wed Jul 27, 2021  3:20 PM)    hydroxychloroquine (PLAQUENIL) 200 MG tablet [id number] Yes Take 1 tablet (200 mg total) by mouth 2 (two) times daily. Kathyrn Drown, MD Taking Active Self  levothyroxine (SYNTHROID) 112 MCG tablet [id number] Yes Take 1 tablet (112 mcg total) by mouth daily. Kathyrn Drown, MD Taking Active Self  Lysine 500 MG TABS [id number] Yes Take 500 mg by mouth daily as needed. [provider] Taking Active Self  Multiple Vitamin (MULTIVITAMIN WITH MINERALS) TABS [id number] Yes Take 1 tablet by mouth daily. [provider] Taking Active Self  olmesartan (BENICAR) 5 MG tablet [id number] Yes Take 1 tablet (5 mg total) by mouth daily. Kathyrn Drown, MD Taking Active   ondansetron [facility]) 4 MG tablet [id number] Yes Take 4 mg by mouth every 8 (eight) hours as needed for nausea or vomiting. [provider] Taking Active Self           Med Note Wilmon Pali, MELISSA R   Wed Jul 27, 2021  3:20 PM)    pantoprazole (PROTONIX) 40 MG tablet [id number] Yes Take 1 tablet (40 mg total) by mouth daily before breakfast. Kathyrn Drown, MD Taking Active Self  polycarbophil (FIBERCON) 625 MG tablet [id number] Yes Take 625 mg by mouth in the morning and at bedtime. [provider] Taking Active Self  polyethylene glycol (MIRALAX / GLYCOLAX) packet [id number] Yes Take 17 g by mouth daily as needed  for mild constipation. Constipation [provider] Taking Active Self           Med Note Wilmon Pali, MELISSA R   Wed Jul 27, 2021  3:19 PM)    pravastatin (PRAVACHOL) 20 MG tablet [id number] Yes Take 1 tablet (20 mg total) by mouth daily.  Patient taking differently: Take 20 mg by mouth 3 (three) times a week.   Kathyrn Drown, MD Taking Active Self           Med Note Wilmon Pali, MELISSA R   Wed Jul 27, 2021  3:18 PM)    predniSONE (DELTASONE) 2.5 MG tablet [id number] Yes Take 7.5 mg by mouth daily with breakfast. [provider] Taking Active Self  Probiotic Product (PROBIOTIC DAILY PO) [id number] Yes Take 1 capsule by mouth daily. [provider] Taking Active Self  promethazine (PHENERGAN) 25 MG tablet [id number] Yes Take 12.5 mg po qhs prn Kathyrn Drown, MD Taking Active   Propylene Glycol (SYSTANE BALANCE OP) [id number] Yes Place 1 drop into both eyes 2 (two) times daily. [provider] Taking Active Self           Med Note Sherren Mocha, Ronnette Juniper   Tue Nov 02, 2020  2:21 PM) Patient states that she uses as needed during the day  terbinafine (LAMISIL) 1 % cream [id number] Yes Apply 1 application topically 2 (two) times daily as needed (antifungal). [provider] Taking Active Self  timolol (TIMOPTIC) 0.5 % ophthalmic solution [id number] Yes Place 1 drop into both eyes daily. [provider] Taking Active Self           Med Note Merceda Elks   Wed Jul 27, 2021  3:18 PM)  traMADol (ULTRAM) 50 MG tablet [id number] Yes TAKE (1) TABLET BY MOUTH (4) TIMES DAILY. Kathyrn Drown, MD Taking Active   Zoledronic Acid (RECLAST IV) [id number] Yes Inject into the vein. yearly [provider] Taking Active Self           Med Note Peter Minium Jul 27, 2021  3:22 PM)              Patient Active Problem List   Diagnosis Date Noted   History of colonic polyps 11/02/2020   Constipation 10/28/2019   LFTs abnormal 12/20/2017    Leukopenia 12/20/2017   Hyponatremia 12/20/2017   [place] spotted fever 12/20/2017   Osteopenia 02/16/2014   Hyperlipidemia 04/22/2013   Hypothyroidism 04/22/2013   GERD (gastroesophageal reflux disease) 01/01/2013   Rheumatoid arthritis ([place]) 12/20/2012   Essential hypertension, benign 12/20/2012   Chronic pain syndrome 12/20/2012    Immunization History  Administered Date(s) Administered   Fluad Quad(high Dose 65+) 05/17/2020, 05/04/2021   Influenza, Seasonal, Injecte, Preservative Fre 06/05/2012   Influenza,inj,Quad PF,6+ Mos 04/22/2013, 05/28/2014, 05/21/2015, 05/15/2016, 05/16/2017, 07/09/2018   Influenza-Unspecified 05/08/2019   Moderna Sars-Covid-2 Vaccination 09/18/2019, 10/17/2019, 07/19/2020   Pneumococcal Conjugate-13 02/16/2014   Pneumococcal Polysaccharide-23 01/04/2010, 11/15/2015   Tdap 06/16/2020   Unspecified SARS-COV-2 Vaccination 05/25/2021   Zoster Recombinat (Shingrix) 08/28/2018, 02/20/2019    Conditions to be addressed/monitored: HTN, HLD, Hypothyroidism, Osteopenia, and RA  Care Plan : Medication Management  Updates made by Beryle Lathe, [place] since 09/05/2021 12:00 AM     Problem: Hypertension, Hyperlipidemia, Rheumatoid Arthritis, Osteopenia, and Hypothyroidism   Priority: High  Onset Date: 03/28/2021     Long-Range Goal: Disease Progression Prevention   Start Date: 03/28/2021  Expected End Date: 06/26/2021  Recent Progress: On track  Priority: High  Note:   Current Barriers:  Unable to independently monitor therapeutic efficacy Unable to achieve control of hypertension  Pharmacist Clinical Goal(s):  Through collaboration with PharmD and provider, patient will  Achieve adherence to monitoring guidelines and medication adherence to achieve therapeutic efficacy Achieve control of hypertension as evidenced by improved blood pressure control   Interventions: 1:1 collaboration with Kathyrn Drown, MD regarding development and  update of comprehensive plan of care as evidenced by provider attestation and co-signature Inter-disciplinary care team collaboration (see longitudinal plan of care) Comprehensive medication review performed; medication list updated in electronic medical record  Hypertension - Goal on Track (progressing): YES.: Blood pressure control is unclear. High today, but pt reports normal blood pressures when monitors outside of office. Blood pressure is at goal of <140/90 mmHg per 2022 AAFP guidelines Current medications: olmesartan 5 mg by mouth once daily and amlodipine 5 mg by mouth once daily Intolerances: none Taking medications as directed: yes Side effects thought to be attributed to current medication regimen: no Denies dizziness, lightheadedness, headache, and blurred vision.  Does report having to get out of bed slow in the morning. Current exercise:  severely limited due to RA and prior right ankle surgery Current home blood pressure:  patient brought home blood pressure log from last 2 months. Most blood pressure has been 110-130s/60-80 with one outlier of SBP in the 140s Continue olmesartan 5 mg by mouth once daily and amlodipine 5 mg by mouth once daily Encourage dietary sodium restriction/DASH diet Recommend home blood pressure monitoring to discuss at next visit Recommend that the patient take blood pressure mediations at bedtime, as opposed to upon waking, since there is some evidence that this  may improve blood pressure control (decrease in asleep blood pressure and increased sleep-time relative blood pressure decline, i.e. BP dipping) and may decrease occurrence of major cardiovascular events (See HYGIA Chronotherapy Trial)  Hyperlipidemia/Aortic Atherosclerosis - Goal on Track (progressing): YES.: Controlled. LDL at goal of <100 due to high risk given at least 2 major CV risk factors (advancing age and hypertension) and 10-year risk 10-20% per 2020 AACE/ACE guidelines. Triglycerides at  goal of <150 per 2020 AACE/ACE guidelines. Current medications: pravastatin 20 mg by mouth three times a week (Tuesday, Thursday, and Saturday) Intolerances: none Taking medications as directed: yes Side effects thought to be attributed to current medication regimen: no Continue current medications as above Re-check lipid panel every 6-12 months Encourage dietary reduction of high fat containing foods such as butter, nuts, bacon, egg yolks, etc.  Osteopenia (Managed by Dr.Aryal) - Condition stable. Not addressed this visit.: Current medications: calcium + vitamin D.  Patient was on zoledronic acid (Reclast) 5 mg IV every 12 months for many years. Patient reports plan to take a bisphosphonate holiday per Dr. Kathlene November. Continue to monitor bone density every couple years. Intolerances: GI intolerance with risedronate Taking medications as directed: yes Side effects thought to be attributed to current medication regimen: no Last DEXA (10/09/19) Falls reported in last year: unknown but patient is high risk Continue calcium + vitamin D Recommend 1,000 mg of elemental calcium per day from diet or supplementation. Supplemental calcium not necessary if appropriate amount obtained through diet. Recommend 800 - 2,000 IU of vitamin D supplementation per day to maintain adequate vitamin D levels. Most recent vitamin D level within normal limits   Rheumatoid Arthritis - Condition stable. Not addressed this visit.: Current medications: hydroxychloroquine 200 mg by mouth twice daily and prednisone 7.5 mg by mouth daily  Continue management per Dr. Kathlene November  Hypothyroidism - Condition stable. Not addressed this visit.: Current medication: levothyroxine 112 mcg by mouth daily TSH and Free T4 within normal limits   Patient Goals/Self-Care Activities Patient will:  Take medications as prescribed Check blood pressure at least once daily, document, and provide at future appointments  Follow Up Plan: Face to Face  appointment with care management team member scheduled for: 11/07/21     Medication Assistance: None required.  Patient affirms current coverage meets needs.  Patient's preferred pharmacy is:  [facility] ([facility]) [address] [place], [place] [address] Phone: [phone] Fax: [phone]  [facility]) - [place], [address] [place] [address] Phone: [phone] Fax: [place], [address] [place] [address] Phone: [phone] Fax: [phone]  Follow Up:  Patient agrees to Care Plan and Follow-up.  Plan: Face to Face appointment with care management team member scheduled for: 11/07/21  Kennon Holter, PharmD, BCACP, CPP Clinical Pharmacist Practitioner [place] [phone]

## 2021-09-05 NOTE — Patient Instructions (Signed)
Sabrina Adams,  It was great to talk to you today!  Please call me with any questions or concerns.  Visit Information  Following are the goals we discussed today:   Goals Addressed             This Visit's Progress    Medication Management       Patient Goals/Self-Care Activities Over the next 90  days, patient will:  Take medications as prescribed Check blood pressure at least once daily, document, and provide at future appointments         Follow-up plan: Face to Face appointment with care management team member scheduled for: 11/07/21  Patient verbalizes understanding of instructions and care plan provided today and agrees to view in [place]. Active MyChart status confirmed with patient.    Please call the care guide team at [phone] if you need to cancel or reschedule your appointment.   Kennon Holter, PharmD, Para [place], CPP Clinical Pharmacist Practitioner [place] [phone]

## 2021-09-06 DIAGNOSIS — E782 Mixed hyperlipidemia: Secondary | ICD-10-CM | POA: Diagnosis not present

## 2021-09-06 DIAGNOSIS — I10 Essential (primary) hypertension: Secondary | ICD-10-CM | POA: Diagnosis not present

## 2021-09-06 DIAGNOSIS — E038 Other specified hypothyroidism: Secondary | ICD-10-CM | POA: Diagnosis not present

## 2021-09-06 DIAGNOSIS — M0579 Rheumatoid arthritis with rheumatoid factor of multiple sites without organ or systems involvement: Secondary | ICD-10-CM

## 2021-09-13 ENCOUNTER — Encounter: Payer: Self-pay | Admitting: Family Medicine

## 2021-09-13 ENCOUNTER — Ambulatory Visit (INDEPENDENT_AMBULATORY_CARE_PROVIDER_SITE_OTHER): Payer: Medicare Other | Admitting: Family Medicine

## 2021-09-13 ENCOUNTER — Other Ambulatory Visit: Payer: Self-pay

## 2021-09-13 VITALS — BP 120/72 | Wt 156.6 lb

## 2021-09-13 DIAGNOSIS — E782 Mixed hyperlipidemia: Secondary | ICD-10-CM | POA: Diagnosis not present

## 2021-09-13 DIAGNOSIS — I10 Essential (primary) hypertension: Secondary | ICD-10-CM

## 2021-09-13 DIAGNOSIS — Z79899 Other long term (current) drug therapy: Secondary | ICD-10-CM | POA: Diagnosis not present

## 2021-09-13 DIAGNOSIS — E038 Other specified hypothyroidism: Secondary | ICD-10-CM

## 2021-09-13 MED ORDER — TRAMADOL HCL 50 MG PO TABS: ORAL_TABLET | ORAL | 4 refills | Status: DC

## 2021-09-13 MED ORDER — PANTOPRAZOLE SODIUM 40 MG PO TBEC: 40.0000 mg | DELAYED_RELEASE_TABLET | Freq: Every day | ORAL | 3 refills | Status: DC

## 2021-09-13 NOTE — Progress Notes (Signed)
° °  Subjective:    Patient ID: Sabrina Adams, female    DOB: 1946/01/07, 76 y.o.   MRN: [id number]  Hypertension This is a chronic problem. The current episode started more than 1 year ago. Risk factors for coronary artery disease include dyslipidemia and post-menopausal state. Treatments tried: norvasc, benicar. There are no compliance problems.    Patient overall doing well Arthritis stable She does not want to go on any type of biologic agent because she had a severe infection related to 1 She does take her cholesterol medicine 3 times weekly  She also takes her thyroid medicine regular basis does well with this Pain medicines sporadically in regards to Dilaudid otherwise tramadol approximately 3 times per day sometimes 4 times per day  She states tramadol does help her with her arthritic pain she denies any problems with the tramadol does not make her drowsy She does not need a refill of her Dilaudid currently.  Review of Systems     Objective:   Physical Exam  Lungs clear heart regular extremities no edema severe rheumatoid arthritis noted       Assessment & Plan:   1. Other specified hypothyroidism Thyroid medicine continue this Watch diet closely  - TSH - T4, free  2. Essential hypertension, benign Minimize salt continue medication blood pressure very good - Lipid panel - Basic metabolic panel  3. Mixed hyperlipidemia Healthy diet continue pravastatin 3 times a week check labs before next visit - Lipid panel  4. High risk medication use Check labs for next visit - Basic metabolic panel  The patient was seen in followup for chronic pain. A review over at their current pain status was discussed. Drug registry was checked. Prescriptions were given.  Regular follow-up recommended. Discussion was held regarding the importance of compliance with medication as well as pain medication contract.  Patient was informed that medication may cause drowsiness and  should not be combined  with other medications/alcohol or street drugs. If the patient feels medication is causing altered alertness then do not drive or operate dangerous equipment.  Should be noted that the patient appears to be meeting appropriate use of opioids and response.  Evidenced by improved function and decent pain control without significant side effects and no evidence of overt aberrancy issues.  Upon discussion with the patient today they understand that opioid therapy is optional and they feel that the pain has been refractory to reasonable conservative measures and is significant and affecting quality of life enough to warrant ongoing therapy and wishes to continue opioids.  Refills were provided.

## 2021-10-03 ENCOUNTER — Other Ambulatory Visit: Payer: Self-pay | Admitting: Family Medicine

## 2021-10-04 ENCOUNTER — Other Ambulatory Visit: Payer: Self-pay

## 2021-10-04 MED ORDER — PRAVASTATIN SODIUM 20 MG PO TABS: 20.0000 mg | ORAL_TABLET | Freq: Every day | ORAL | 1 refills | Status: DC

## 2021-10-04 MED ORDER — ONDANSETRON HCL 4 MG PO TABS: ORAL_TABLET | ORAL | 5 refills | Status: DC

## 2021-10-19 DIAGNOSIS — Z7952 Long term (current) use of systemic steroids: Secondary | ICD-10-CM | POA: Diagnosis not present

## 2021-10-19 DIAGNOSIS — M2550 Pain in unspecified joint: Secondary | ICD-10-CM | POA: Diagnosis not present

## 2021-10-19 DIAGNOSIS — M549 Dorsalgia, unspecified: Secondary | ICD-10-CM | POA: Diagnosis not present

## 2021-10-19 DIAGNOSIS — M0579 Rheumatoid arthritis with rheumatoid factor of multiple sites without organ or systems involvement: Secondary | ICD-10-CM | POA: Diagnosis not present

## 2021-10-19 DIAGNOSIS — M79643 Pain in unspecified hand: Secondary | ICD-10-CM | POA: Diagnosis not present

## 2021-10-19 DIAGNOSIS — M1990 Unspecified osteoarthritis, unspecified site: Secondary | ICD-10-CM | POA: Diagnosis not present

## 2021-10-19 DIAGNOSIS — M810 Age-related osteoporosis without current pathological fracture: Secondary | ICD-10-CM | POA: Diagnosis not present

## 2021-10-19 DIAGNOSIS — M25439 Effusion, unspecified wrist: Secondary | ICD-10-CM | POA: Diagnosis not present

## 2021-10-22 ENCOUNTER — Encounter: Payer: Self-pay | Admitting: Family Medicine

## 2021-10-24 MED ORDER — LEVOTHYROXINE SODIUM 112 MCG PO TABS: 112.0000 ug | ORAL_TABLET | Freq: Every day | ORAL | 1 refills | Status: DC

## 2021-11-02 ENCOUNTER — Telehealth: Payer: Self-pay | Admitting: *Deleted

## 2021-11-02 NOTE — Chronic Care Management (AMB) (Signed)
?  Care Management  ? ?Note ? ?11/02/2021 ?Name: [name] MRN: [id number] DOB: 03/21/1946 ? ?Sabrina Adams is a 76 y.o. year old female who is a primary care patient of Luking, Elayne Snare, MD and is actively engaged with the care management team. I reached out to [name] by phone today to assist with scheduling an initial visit with the RN Case Manager ? ?Follow up plan: ?Unsuccessful telephone outreach attempt made. A HIPAA compliant phone message was left for the patient providing contact information and requesting a return call.  ?The care management team will reach out to the patient again over the next 7 days.  ?If patient returns call to provider office, please advise to call [place] at [phone]. ? ?Laverda Sorenson  ?Care Guide, Embedded Care Coordination ?[place]  Care Management  ?Direct Dial: [phone] ? ?

## 2021-11-07 ENCOUNTER — Ambulatory Visit: Payer: Medicare Other

## 2021-11-07 NOTE — Chronic Care Management (AMB) (Signed)
?  Care Management  ? ?Note ? ?11/07/2021 ?Name: Sabrina Adams MRN: [id number] DOB: April 27, 1946 ? ?Sabrina Adams is a 76 y.o. year old female who is a primary care patient of Luking, Elayne Snare, MD and is actively engaged with the care management team. I reached out to [name] by phone today to assist with re-scheduling a follow up visit with the Pharmacist ? ?Follow up plan: ?Patient declines further follow up and engagement by the care management team. Appropriate care team members and provider have been notified via electronic communication. The care management team is available to follow up with the patient after provider conversation with the patient regarding recommendation for care management engagement and subsequent re-referral to the care management team.  ? ?Laverda Sorenson  ?Care Guide, Embedded Care Coordination ?[place]  Care Management  ?Direct Dial: [phone] ? ?

## 2021-11-21 ENCOUNTER — Encounter: Payer: Self-pay | Admitting: Family Medicine

## 2021-11-22 MED ORDER — VALACYCLOVIR HCL 1 G PO TABS: ORAL_TABLET | ORAL | 3 refills | Status: DC

## 2021-11-22 NOTE — Telephone Encounter (Signed)
Treatment ?Valtrex 1 g ?Take 2 now and repeat again in 12 hours ?#4 ?3 refills ? ?Guidelines/studies show taking 2 pills followed 12 hours later by 2 pills will take care of fever blisters.  That may be utilized again in the future should it recur at another time ?

## 2021-11-30 ENCOUNTER — Other Ambulatory Visit: Payer: Self-pay | Admitting: Family Medicine

## 2021-11-30 ENCOUNTER — Encounter: Payer: Self-pay | Admitting: Family Medicine

## 2021-11-30 MED ORDER — GABAPENTIN 300 MG PO CAPS: 300.0000 mg | ORAL_CAPSULE | Freq: Two times a day (BID) | ORAL | 3 refills | Status: DC

## 2022-01-01 ENCOUNTER — Other Ambulatory Visit: Payer: Self-pay | Admitting: Family Medicine

## 2022-01-30 ENCOUNTER — Other Ambulatory Visit: Payer: Self-pay | Admitting: Family Medicine

## 2022-01-30 DIAGNOSIS — I10 Essential (primary) hypertension: Secondary | ICD-10-CM

## 2022-02-24 ENCOUNTER — Encounter: Payer: Self-pay | Admitting: Family Medicine

## 2022-03-02 DIAGNOSIS — Z79899 Other long term (current) drug therapy: Secondary | ICD-10-CM | POA: Diagnosis not present

## 2022-03-02 DIAGNOSIS — H16223 Keratoconjunctivitis sicca, not specified as Sjogren's, bilateral: Secondary | ICD-10-CM | POA: Diagnosis not present

## 2022-03-02 DIAGNOSIS — H01001 Unspecified blepharitis right upper eyelid: Secondary | ICD-10-CM | POA: Diagnosis not present

## 2022-03-02 DIAGNOSIS — H401121 Primary open-angle glaucoma, left eye, mild stage: Secondary | ICD-10-CM | POA: Diagnosis not present

## 2022-03-06 ENCOUNTER — Encounter: Payer: Self-pay | Admitting: Family Medicine

## 2022-03-08 DIAGNOSIS — E038 Other specified hypothyroidism: Secondary | ICD-10-CM | POA: Diagnosis not present

## 2022-03-08 DIAGNOSIS — I10 Essential (primary) hypertension: Secondary | ICD-10-CM | POA: Diagnosis not present

## 2022-03-08 DIAGNOSIS — Z79899 Other long term (current) drug therapy: Secondary | ICD-10-CM | POA: Diagnosis not present

## 2022-03-08 DIAGNOSIS — E782 Mixed hyperlipidemia: Secondary | ICD-10-CM | POA: Diagnosis not present

## 2022-03-09 LAB — T4, FREE: Free T4: 1.76 ng/dL (ref 0.82–1.77)

## 2022-03-09 LAB — BASIC METABOLIC PANEL
BUN/Creatinine Ratio: 20 (ref 12–28)
BUN: 16 mg/dL (ref 8–27)
CO2: 26 mmol/L (ref 20–29)
Calcium: 9.5 mg/dL (ref 8.7–10.3)
Chloride: 103 mmol/L (ref 96–106)
Creatinine, Ser: 0.79 mg/dL (ref 0.57–1.00)
Glucose: 81 mg/dL (ref 70–99)
Potassium: 4.7 mmol/L (ref 3.5–5.2)
Sodium: 142 mmol/L (ref 134–144)
eGFR: 79 mL/min/{1.73_m2} (ref >59)

## 2022-03-09 LAB — LIPID PANEL
Chol/HDL Ratio: 2.9 ratio (ref 0.0–4.4)
Cholesterol, Total: 196 mg/dL (ref 100–199)
HDL: 68 mg/dL (ref >39)
LDL Chol Calc (NIH): 95 mg/dL (ref 0–99)
Triglycerides: 197 mg/dL — ABNORMAL HIGH (ref 0–149)
VLDL Cholesterol Cal: 33 mg/dL (ref 5–40)

## 2022-03-09 LAB — TSH: TSH: 1.25 u[IU]/mL (ref 0.450–4.500)

## 2022-03-13 ENCOUNTER — Ambulatory Visit (INDEPENDENT_AMBULATORY_CARE_PROVIDER_SITE_OTHER): Payer: Medicare Other | Admitting: Family Medicine

## 2022-03-13 VITALS — BP 132/68 | HR 74 | Temp 98.4°F | Ht 65.0 in | Wt 150.0 lb

## 2022-03-13 DIAGNOSIS — I700 Atherosclerosis of aorta: Secondary | ICD-10-CM | POA: Diagnosis not present

## 2022-03-13 DIAGNOSIS — G894 Chronic pain syndrome: Secondary | ICD-10-CM | POA: Diagnosis not present

## 2022-03-13 DIAGNOSIS — E782 Mixed hyperlipidemia: Secondary | ICD-10-CM

## 2022-03-13 DIAGNOSIS — E038 Other specified hypothyroidism: Secondary | ICD-10-CM | POA: Diagnosis not present

## 2022-03-13 DIAGNOSIS — M8580 Other specified disorders of bone density and structure, unspecified site: Secondary | ICD-10-CM

## 2022-03-13 DIAGNOSIS — I10 Essential (primary) hypertension: Secondary | ICD-10-CM | POA: Diagnosis not present

## 2022-03-13 DIAGNOSIS — M0579 Rheumatoid arthritis with rheumatoid factor of multiple sites without organ or systems involvement: Secondary | ICD-10-CM

## 2022-03-13 MED ORDER — HYDROMORPHONE HCL 2 MG PO TABS: ORAL_TABLET | ORAL | 0 refills | Status: DC

## 2022-03-13 MED ORDER — TRAMADOL HCL 50 MG PO TABS: ORAL_TABLET | ORAL | 4 refills | Status: DC

## 2022-03-13 MED ORDER — ROSUVASTATIN CALCIUM 10 MG PO TABS: ORAL_TABLET | ORAL | 3 refills | Status: DC

## 2022-03-13 NOTE — Progress Notes (Signed)
   Subjective:    Patient ID: Sabrina Adams, female    DOB: 1946/03/13, 76 y.o.   MRN: [id number]  Hypertension This is a chronic problem. Treatments tried: amlodipine.   6 month follow up  Mixed hyperlipidemia - Plan: ALT, Lipid panel, CANCELED: Lipid panel  Chronic pain syndrome  Osteopenia, unspecified location  Other specified hypothyroidism - Plan: ALT, Lipid panel  Essential hypertension, benign - Plan: ALT, Lipid panel  Atherosclerosis of aorta (HCC)  Rheumatoid arthritis with rheumatoid factor of multiple sites without organ or systems involvement ([place]), Chronic She does have hypothyroidism and takes her medicine recent lab work looks good She has chronic pain from rheumatoid arthritis mainly in her joints tramadol helps her most of the time occasionally has to take Dilaudid.  Does not tolerate other pain meds Does have osteopenia her rheumatologist is following this and she is currently on repeat class but states she is taking a drug holiday from this Does take her blood pressure medicines regular basis Has history of aortic atherosclerosis  Review of Systems     Objective:   Physical Exam General-in no acute distress Eyes-no discharge Lungs-respiratory rate normal, CTA CV-no murmurs,RRR Extremities skin warm dry no edema Neuro grossly normal Behavior normal, alert        Assessment & Plan:   1. Mixed hyperlipidemia Change medication try to get LDL below 70 because of history of aortic atherosclerosis - ALT - Lipid panel  2. Chronic pain syndrome Continue tramadol patient benefits from this uses Dilaudid very rarely to help her with her discomfort  3. Osteopenia, unspecified location Under the care of rheumatologist  4. Other specified hypothyroidism Thyroid overall doing well continue current medication - ALT - Lipid panel  5. Essential hypertension, benign Blood pressure doing well continue current medication - ALT - Lipid  panel Rheumatoid arthritis being treated by the specialist Aortic atherosclerosis get LDL below 70 if possible

## 2022-04-24 ENCOUNTER — Telehealth: Payer: Self-pay | Admitting: *Deleted

## 2022-04-24 NOTE — Telephone Encounter (Signed)
Noted-please talk with patient.  Her ABIs are abnormal but are not critical.  If she is not having any claudication issues (severe calf  Pain with walking) then the approach for this is more just watching (Certainly we can do a follow-up office visit if she wants to discuss it in more detail)

## 2022-04-24 NOTE — Telephone Encounter (Signed)
FYI:  Mavis from [facility] called to stated she did circulation test on patient on an house call and  RT- 0.83 LT- 0.93

## 2022-04-25 DIAGNOSIS — M79643 Pain in unspecified hand: Secondary | ICD-10-CM | POA: Diagnosis not present

## 2022-04-25 DIAGNOSIS — M549 Dorsalgia, unspecified: Secondary | ICD-10-CM | POA: Diagnosis not present

## 2022-04-25 DIAGNOSIS — M0579 Rheumatoid arthritis with rheumatoid factor of multiple sites without organ or systems involvement: Secondary | ICD-10-CM | POA: Diagnosis not present

## 2022-04-25 DIAGNOSIS — M25439 Effusion, unspecified wrist: Secondary | ICD-10-CM | POA: Diagnosis not present

## 2022-04-25 DIAGNOSIS — M810 Age-related osteoporosis without current pathological fracture: Secondary | ICD-10-CM | POA: Diagnosis not present

## 2022-04-25 DIAGNOSIS — Z7952 Long term (current) use of systemic steroids: Secondary | ICD-10-CM | POA: Diagnosis not present

## 2022-04-25 DIAGNOSIS — M2550 Pain in unspecified joint: Secondary | ICD-10-CM | POA: Diagnosis not present

## 2022-04-25 DIAGNOSIS — M1990 Unspecified osteoarthritis, unspecified site: Secondary | ICD-10-CM | POA: Diagnosis not present

## 2022-04-25 DIAGNOSIS — M79671 Pain in right foot: Secondary | ICD-10-CM | POA: Diagnosis not present

## 2022-04-25 NOTE — Telephone Encounter (Signed)
Left message to return call 

## 2022-04-27 NOTE — Telephone Encounter (Signed)
Sent MyChart message to patient

## 2022-05-02 ENCOUNTER — Telehealth: Payer: Self-pay

## 2022-05-02 NOTE — Telephone Encounter (Signed)
Patient states she see her rheumatologist for this , Dr Lenetta Quaker , and they have sent in a mail order for prednisone, but it wont arrive for a while, she states she will not be able to take a biologic , like prolia, so she will have to continue taking prednisone and plaquenil . Please a short script of prednisone to [facility].

## 2022-05-02 NOTE — Telephone Encounter (Signed)
Caller name:Lilliam Tomlin   On DPR? :No  Call back number:[phone]  Provider they see: Luk[place]   Reason for call:Pt is calling she has mail order for predniSONE (DELTASONE) 2.5 MG tablet  she needs a 5 day supply ordered until the other comes in   [place]

## 2022-05-02 NOTE — Telephone Encounter (Signed)
Prednisone 2.5 mg, 3 daily, #15

## 2022-05-03 ENCOUNTER — Telehealth: Payer: Self-pay | Admitting: Family Medicine

## 2022-05-03 MED ORDER — PREDNISONE 2.5 MG PO TABS: 7.5000 mg | ORAL_TABLET | Freq: Every day | ORAL | 0 refills | Status: AC

## 2022-05-03 NOTE — Telephone Encounter (Signed)
Pt made aware to have labs complete.

## 2022-05-03 NOTE — Telephone Encounter (Signed)
Med sent to pharmacy and pt is aware

## 2022-05-03 NOTE — Telephone Encounter (Signed)
Pt contacted and verbalized understanding.  

## 2022-05-03 NOTE — Telephone Encounter (Signed)
Patient asking if she still needs to complete lab work

## 2022-05-08 ENCOUNTER — Other Ambulatory Visit (HOSPITAL_COMMUNITY): Payer: Self-pay | Admitting: Family Medicine

## 2022-05-08 DIAGNOSIS — Z1231 Encounter for screening mammogram for malignant neoplasm of breast: Secondary | ICD-10-CM

## 2022-05-12 ENCOUNTER — Ambulatory Visit: Payer: Medicare Other

## 2022-05-17 ENCOUNTER — Ambulatory Visit: Payer: Medicare Other

## 2022-05-24 ENCOUNTER — Ambulatory Visit (INDEPENDENT_AMBULATORY_CARE_PROVIDER_SITE_OTHER): Payer: Medicare Other | Admitting: *Deleted

## 2022-05-24 DIAGNOSIS — Z23 Encounter for immunization: Secondary | ICD-10-CM

## 2022-05-29 ENCOUNTER — Other Ambulatory Visit: Payer: Self-pay | Admitting: Family Medicine

## 2022-05-29 DIAGNOSIS — I10 Essential (primary) hypertension: Secondary | ICD-10-CM

## 2022-06-06 DIAGNOSIS — I739 Peripheral vascular disease, unspecified: Secondary | ICD-10-CM | POA: Diagnosis not present

## 2022-06-06 DIAGNOSIS — M79674 Pain in right toe(s): Secondary | ICD-10-CM | POA: Diagnosis not present

## 2022-06-06 DIAGNOSIS — M79675 Pain in left toe(s): Secondary | ICD-10-CM | POA: Diagnosis not present

## 2022-06-06 DIAGNOSIS — M79671 Pain in right foot: Secondary | ICD-10-CM | POA: Diagnosis not present

## 2022-06-06 DIAGNOSIS — L609 Nail disorder, unspecified: Secondary | ICD-10-CM | POA: Diagnosis not present

## 2022-06-06 DIAGNOSIS — M79672 Pain in left foot: Secondary | ICD-10-CM | POA: Diagnosis not present

## 2022-06-09 ENCOUNTER — Ambulatory Visit (HOSPITAL_COMMUNITY)
Admission: RE | Admit: 2022-06-09 | Discharge: 2022-06-09 | Disposition: A | Discharge status: Home / Self Care | Payer: Medicare Other | Source: Ambulatory Visit | Attending: Family Medicine | Admitting: Family Medicine

## 2022-06-09 ENCOUNTER — Other Ambulatory Visit: Payer: Self-pay | Admitting: Family Medicine

## 2022-06-09 DIAGNOSIS — Z1231 Encounter for screening mammogram for malignant neoplasm of breast: Secondary | ICD-10-CM | POA: Insufficient documentation

## 2022-06-16 ENCOUNTER — Ambulatory Visit: Payer: Medicare Other | Admitting: Podiatry

## 2022-06-16 ENCOUNTER — Ambulatory Visit: Payer: Medicare Other

## 2022-06-16 DIAGNOSIS — M216X1 Other acquired deformities of right foot: Secondary | ICD-10-CM

## 2022-06-16 DIAGNOSIS — Z89441 Acquired absence of right ankle: Secondary | ICD-10-CM

## 2022-06-16 DIAGNOSIS — M21961 Unspecified acquired deformity of right lower leg: Secondary | ICD-10-CM

## 2022-06-16 NOTE — Progress Notes (Signed)
Subjective:  Patient ID: Sabrina Adams, female    DOB: 05-04-1946,  MRN: [id number]  Chief Complaint  Patient presents with   Foot Pain    Pt stated that she just wants to know what options are open for her     76 y.o. female presents with the above complaint.  Patient presents with right foot deformity.  She has multiple surgeries done in the past.  She just wants to discuss treatment options for the right foot.  She states that she has had many surgeries and her foot is pretty deformed.  It is in a pes planocalgus foot type with a varus deformity to it.  She does not want to do any further surgery.  She has a history of rheumatoid arthritis and had multiple surgeries done because of it.  The surgeries were done by outside physician over the course of last 5 to 7 years.   Review of Systems: Negative except as noted in the HPI. Denies N/V/F/Ch.  Past Medical History:  Diagnosis Date   Anemia    Arthritis    rheumatoid   Complication of anesthesia    nausea ,  psycotics episodes   GERD (gastroesophageal reflux disease)    Hypertension    Hypothyroidism    Neuromuscular disorder (HCC)    neuropathy  legs   PONV (postoperative nausea and vomiting)    RA (rheumatoid arthritis) (HCC)    Sleep apnea    Stroke (HCC)    mild   TMJ (dislocation of temporomandibular joint)     Current Outpatient Medications:    acetaminophen (TYLENOL) 650 MG CR tablet, Take 1,300 mg by mouth 2 (two) times daily., Disp: , Rfl:    amLODipine (NORVASC) 5 MG tablet, TAKE 1 TABLET BY MOUTH DAILY, Disp: 100 tablet, Rfl: 1   b complex vitamins tablet, Take 1 tablet by mouth daily., Disp: , Rfl:    Calcium Carbonate-Vitamin D (CALCIUM 600 + D PO), Take 1 tablet by mouth daily., Disp: , Rfl:    Cholecalciferol (VITAMIN D3) 125 MCG (5000 UT) CAPS, Take 5,000 Units by mouth once a week., Disp: , Rfl:    docusate sodium (COLACE) 100 MG capsule, Take 200 mg by mouth 2 (two) times daily., Disp: , Rfl:     gabapentin (NEURONTIN) 300 MG capsule, Take 1 capsule (300 mg total) by mouth 2 (two) times daily. 3rd dose if needed, Disp: 270 capsule, Rfl: 3   hydrocortisone 1 % ointment, Apply 1 application topically 2 (two) times daily as needed for itching. (Patient not taking: Reported on 03/13/2022), Disp: , Rfl:    HYDROmorphone (DILAUDID) 2 MG tablet, One po three times daily as needed, Disp: 10 tablet, Rfl: 0   hydroxychloroquine (PLAQUENIL) 200 MG tablet, Take 1 tablet (200 mg total) by mouth 2 (two) times daily., Disp: 180 tablet, Rfl: 1   levothyroxine (SYNTHROID) 112 MCG tablet, TAKE 1 TABLET BY MOUTH DAILY, Disp: 100 tablet, Rfl: 2   Lysine 500 MG TABS, Take 500 mg by mouth daily as needed., Disp: , Rfl:    Multiple Vitamin (MULTIVITAMIN WITH MINERALS) TABS, Take 1 tablet by mouth daily., Disp: , Rfl:    olmesartan (BENICAR) 5 MG tablet, TAKE 1 TABLET BY MOUTH DAILY, Disp: 100 tablet, Rfl: 1   ondansetron (ZOFRAN) 4 MG tablet, TAKE (1) TABLET BY MOUTH (3) TIMES DAILY AS NEEDED FOR NAUSEA (Patient not taking: Reported on 03/13/2022), Disp: 12 tablet, Rfl: 5   pantoprazole (PROTONIX) 40 MG tablet, TAKE 1  TABLET BY MOUTH DAILY  BEFORE BREAKFAST, Disp: 100 tablet, Rfl: 2   polycarbophil (FIBERCON) 625 MG tablet, Take 625 mg by mouth in the morning and at bedtime., Disp: , Rfl:    polyethylene glycol (MIRALAX / GLYCOLAX) packet, Take 17 g by mouth daily as needed for mild constipation. Constipation (Patient not taking: Reported on 03/13/2022), Disp: , Rfl:    predniSONE (DELTASONE) 2.5 MG tablet, Take 3 tablets (7.5 mg total) by mouth daily with breakfast., Disp: 15 tablet, Rfl: 0   Probiotic Product (PROBIOTIC DAILY PO), Take 1 capsule by mouth daily., Disp: , Rfl:    promethazine (PHENERGAN) 25 MG tablet, Take 12.5 mg po qhs prn (Patient not taking: Reported on 03/13/2022), Disp: 90 tablet, Rfl: 2   Propylene Glycol (SYSTANE BALANCE OP), Place 1 drop into both eyes 2 (two) times daily., Disp: , Rfl:     rosuvastatin (CRESTOR) 10 MG tablet, Take one on M W F, Disp: 36 tablet, Rfl: 3   terbinafine (LAMISIL) 1 % cream, Apply 1 application topically 2 (two) times daily as needed (antifungal)., Disp: , Rfl:    timolol (TIMOPTIC) 0.5 % ophthalmic solution, Place 1 drop into both eyes daily., Disp: , Rfl:    traMADol (ULTRAM) 50 MG tablet, TAKE (1) TABLET BY MOUTH (4) TIMES DAILY., Disp: 120 tablet, Rfl: 4   Zoledronic Acid (RECLAST IV), Inject into the vein. yearly, Disp: , Rfl:   Social History   Tobacco Use  Smoking Status Never  Smokeless Tobacco Never    Allergies  Allergen Reactions   Betadine [Povidone Iodine] Itching    "Burns"   Cinnamon Anaphylaxis    Throat and Tongue    Codeine Nausea Only    Patient can become woozy   Other Nausea And Vomiting and Other (See Comments)    Pt states "all narcotic pain medications make me sick to my stomach and sometimes make me woozy   Actonel [Risedronate Sodium]     Stomach upset   Adhesive [Tape] Other (See Comments)    Irritates skin    Lidocaine     Causes vasoconstriction per patient   Methotrexate Derivatives     Elevated liver enzymes   Objective:  There were no vitals filed for this visit. There is no height or weight on file to calculate BMI. Constitutional Well developed. Well nourished.  Vascular Dorsalis pedis pulses palpable bilaterally. Posterior tibial pulses palpable bilaterally. Capillary refill normal to all digits.  No cyanosis or clubbing noted. Pedal hair growth normal.  Neurologic Normal speech. Oriented to person, place, and time. Epicritic sensation to light touch grossly present bilaterally.  Dermatologic Nails well groomed and normal in appearance. No open wounds. No skin lesions.  Orthopedic: Severe foot deformity noted with pes planovalgus with varus deformity noted.  No other abnormalities identified.  Pain on palpation to the lateral foot.   Radiographs: None Assessment:   1. Acquired abduction  deformity of right foot   2. Deformity of right foot    Plan:  Patient was evaluated and treated and all questions answered.  Right severe past planovalgus noted with lateral dislocation of the foot -All questions and concerns were discussed with the patient in extensive detail.  Patient has a lot of chronic pain due to the deformity of the foot.  I discussed with the patient patient may benefit from a stabilization brace such Afo/Miso bracing. -I gave her prescription to obtain a bracing to [facility] [facility].  No follow-ups on file.

## 2022-06-20 DIAGNOSIS — E038 Other specified hypothyroidism: Secondary | ICD-10-CM | POA: Diagnosis not present

## 2022-06-20 DIAGNOSIS — I10 Essential (primary) hypertension: Secondary | ICD-10-CM | POA: Diagnosis not present

## 2022-06-20 DIAGNOSIS — E782 Mixed hyperlipidemia: Secondary | ICD-10-CM | POA: Diagnosis not present

## 2022-06-21 ENCOUNTER — Encounter: Payer: Self-pay | Admitting: Family Medicine

## 2022-06-21 LAB — LIPID PANEL
Chol/HDL Ratio: 2 ratio (ref 0.0–4.4)
Cholesterol, Total: 177 mg/dL (ref 100–199)
HDL: 87 mg/dL (ref >39)
LDL Chol Calc (NIH): 69 mg/dL (ref 0–99)
Triglycerides: 125 mg/dL (ref 0–149)
VLDL Cholesterol Cal: 21 mg/dL (ref 5–40)

## 2022-06-21 LAB — ALT: ALT: 22 IU/L (ref 0–32)

## 2022-06-21 NOTE — Progress Notes (Signed)
Please mail to the patient 

## 2022-07-25 ENCOUNTER — Ambulatory Visit (INDEPENDENT_AMBULATORY_CARE_PROVIDER_SITE_OTHER): Payer: Medicare Other | Admitting: Family Medicine

## 2022-07-25 VITALS — BP 133/74 | HR 64 | Temp 98.6°F | Ht 65.0 in | Wt 161.0 lb

## 2022-07-25 DIAGNOSIS — J069 Acute upper respiratory infection, unspecified: Secondary | ICD-10-CM | POA: Diagnosis not present

## 2022-07-25 NOTE — Progress Notes (Signed)
   Subjective:    Patient ID: Sabrina Adams, female    DOB: 1946/04/08, 76 y.o.   MRN: [id number]  HPI  Sinus congestion, sore throat, cough slightly productive, no fever Took delsyum, sudafed, cepacol Husband had flulike illness that turned into pneumonia Patient relates more cough no high fevers felt fatigue tiredness no other symptoms Review of Systems     Objective:   Physical Exam Gen-NAD not toxic TMS-normal bilateral T- normal no redness Chest-CTA respiratory rate normal no crackles CV RRR no murmur Skin-warm dry Neuro-grossly normal  Warning signs regarding secondary infection discussed, pain and has follow-up office visit on Tuesday      Assessment & Plan:  Viral-like illness No secondary infection currently Patient has a prescription of amoxicillin at home she will utilize this if her symptoms get worse she will let us know if she gets worse recommend follow-up office visit if progressive symptoms

## 2022-07-28 ENCOUNTER — Ambulatory Visit: Payer: Medicare Other | Admitting: Podiatry

## 2022-08-01 ENCOUNTER — Ambulatory Visit (INDEPENDENT_AMBULATORY_CARE_PROVIDER_SITE_OTHER): Payer: Medicare Other | Admitting: Family Medicine

## 2022-08-01 VITALS — BP 116/67 | HR 76 | Temp 98.4°F | Ht 65.0 in | Wt 158.0 lb

## 2022-08-01 DIAGNOSIS — Z0000 Encounter for general adult medical examination without abnormal findings: Secondary | ICD-10-CM | POA: Diagnosis not present

## 2022-08-01 NOTE — Progress Notes (Signed)
   Subjective:    Patient ID: Sabrina Adams, female    DOB: 30-Mar-1946, 76 y.o.   MRN: [id number]  HPI The patient comes in today for a wellness visit.    A review of their health history was completed.  A review of medications was also completed.  Any needed refills; none  Eating habits: goog  Falls/  MVA accidents in past few months: no  Regular exercise:   Specialist pt sees on regular basis: rheumatoid, optomologist  Preventative health issues were discussed.   Additional concerns: none    Review of Systems     Objective:   Physical Exam  General-in no acute distress Eyes-no discharge Lungs-respiratory rate normal, CTA CV-no murmurs,RRR Extremities skin warm dry no edema Neuro grossly normal Behavior normal, alert  Soft abdomen soft Best I can tell from electronic record patient up-to-date on immunizations-patient will do RSV somewhere in the near future     Assessment & Plan:  Adult wellness-complete.wellness physical was conducted today. Importance of diet and exercise were discussed in detail.  Importance of stress reduction and healthy living were discussed.  In addition to this a discussion regarding safety was also covered.  We also reviewed over immunizations and gave recommendations regarding current immunization needed for age.   In addition to this additional areas were also touched on including: Preventative health exams needed:  Colonoscopy this year-no further recommended at this time  Patient was advised yearly wellness exam  Electronic forms stated that this patient has already had her annual wellness visit for this year therefore we coded it under a wellness physical

## 2022-08-11 ENCOUNTER — Ambulatory Visit (INDEPENDENT_AMBULATORY_CARE_PROVIDER_SITE_OTHER): Payer: Medicare Other

## 2022-08-11 ENCOUNTER — Ambulatory Visit: Payer: Medicare Other | Admitting: Podiatry

## 2022-08-11 VITALS — BP 130/70

## 2022-08-11 DIAGNOSIS — M2041 Other hammer toe(s) (acquired), right foot: Secondary | ICD-10-CM

## 2022-08-11 DIAGNOSIS — M216X1 Other acquired deformities of right foot: Secondary | ICD-10-CM

## 2022-08-11 DIAGNOSIS — Z01818 Encounter for other preprocedural examination: Secondary | ICD-10-CM | POA: Diagnosis not present

## 2022-08-11 NOTE — Progress Notes (Signed)
Subjective:  Patient ID: Sabrina Adams, female    DOB: 1945/11/29,  MRN: [id number]  Chief Complaint  Patient presents with   Foot Pain    77 y.o. female presents with the above complaint.  Patient presents with follow-up of severe abduction deformity of the foot after undergoing multiple right foot surgeries.  She states that she is able to manage all of them now.  The bracing has helped.  She would like to discuss treatment options for hammertoe contractures of digits 2 through 5.  Patient states that they have started to bother her and denies any other acute complaints.  She would like to discuss treatment options for this.  She is not a diabetic.  She has a history of rheumatoid arthritis.  She has tried all conservative care including shoe gear modification padding protecting offloading she would like To discuss surgical options at this time   Review of Systems: Negative except as noted in the HPI. Denies N/V/F/Ch.  Past Medical History:  Diagnosis Date   Anemia    Arthritis    rheumatoid   Complication of anesthesia    nausea ,  psycotics episodes   GERD (gastroesophageal reflux disease)    Hypertension    Hypothyroidism    Neuromuscular disorder (HCC)    neuropathy  legs   PONV (postoperative nausea and vomiting)    RA (rheumatoid arthritis) (HCC)    Sleep apnea    Stroke (HCC)    mild   TMJ (dislocation of temporomandibular joint)     Current Outpatient Medications:    acetaminophen (TYLENOL) 650 MG CR tablet, Take 1,300 mg by mouth 2 (two) times daily., Disp: , Rfl:    amLODipine (NORVASC) 5 MG tablet, TAKE 1 TABLET BY MOUTH DAILY, Disp: 100 tablet, Rfl: 1   b complex vitamins tablet, Take 1 tablet by mouth daily., Disp: , Rfl:    Calcium Carbonate-Vitamin D (CALCIUM 600 + D PO), Take 1 tablet by mouth daily., Disp: , Rfl:    Cholecalciferol (VITAMIN D3) 125 MCG (5000 UT) CAPS, Take 5,000 Units by mouth once a week., Disp: , Rfl:    docusate sodium (COLACE) 100  MG capsule, Take 200 mg by mouth 2 (two) times daily., Disp: , Rfl:    gabapentin (NEURONTIN) 300 MG capsule, Take 1 capsule (300 mg total) by mouth 2 (two) times daily. 3rd dose if needed, Disp: 270 capsule, Rfl: 3   hydrocortisone 1 % ointment, Apply 1 application  topically 2 (two) times daily as needed for itching., Disp: , Rfl:    HYDROmorphone (DILAUDID) 2 MG tablet, One po three times daily as needed, Disp: 10 tablet, Rfl: 0   hydroxychloroquine (PLAQUENIL) 200 MG tablet, Take 1 tablet (200 mg total) by mouth 2 (two) times daily., Disp: 180 tablet, Rfl: 1   levothyroxine (SYNTHROID) 112 MCG tablet, TAKE 1 TABLET BY MOUTH DAILY, Disp: 100 tablet, Rfl: 2   Lysine 500 MG TABS, Take 500 mg by mouth daily as needed., Disp: , Rfl:    Multiple Vitamin (MULTIVITAMIN WITH MINERALS) TABS, Take 1 tablet by mouth daily., Disp: , Rfl:    olmesartan (BENICAR) 5 MG tablet, TAKE 1 TABLET BY MOUTH DAILY, Disp: 100 tablet, Rfl: 1   ondansetron (ZOFRAN) 4 MG tablet, TAKE (1) TABLET BY MOUTH (3) TIMES DAILY AS NEEDED FOR NAUSEA, Disp: 12 tablet, Rfl: 5   pantoprazole (PROTONIX) 40 MG tablet, TAKE 1 TABLET BY MOUTH DAILY  BEFORE BREAKFAST, Disp: 100 tablet, Rfl: 2  polycarbophil (FIBERCON) 625 MG tablet, Take 625 mg by mouth in the morning and at bedtime., Disp: , Rfl:    polyethylene glycol (MIRALAX / GLYCOLAX) packet, Take 17 g by mouth daily as needed for mild constipation. Constipation, Disp: , Rfl:    predniSONE (DELTASONE) 2.5 MG tablet, Take 3 tablets (7.5 mg total) by mouth daily with breakfast., Disp: 15 tablet, Rfl: 0   Probiotic Product (PROBIOTIC DAILY PO), Take 1 capsule by mouth daily., Disp: , Rfl:    promethazine (PHENERGAN) 25 MG tablet, Take 12.5 mg po qhs prn, Disp: 90 tablet, Rfl: 2   Propylene Glycol (SYSTANE BALANCE OP), Place 1 drop into both eyes 2 (two) times daily., Disp: , Rfl:    rosuvastatin (CRESTOR) 10 MG tablet, Take one on M W F, Disp: 36 tablet, Rfl: 3   terbinafine (LAMISIL) 1  % cream, Apply 1 application topically 2 (two) times daily as needed (antifungal)., Disp: , Rfl:    timolol (TIMOPTIC) 0.5 % ophthalmic solution, Place 1 drop into both eyes daily., Disp: , Rfl:    traMADol (ULTRAM) 50 MG tablet, TAKE (1) TABLET BY MOUTH (4) TIMES DAILY., Disp: 120 tablet, Rfl: 4   Zoledronic Acid (RECLAST IV), Inject into the vein. yearly, Disp: , Rfl:   Social History   Tobacco Use  Smoking Status Never  Smokeless Tobacco Never    Allergies  Allergen Reactions   Betadine [Povidone Iodine] Itching    "Burns"   Cinnamon Anaphylaxis    Throat and Tongue    Codeine Nausea Only    Patient can become woozy   Other Nausea And Vomiting and Other (See Comments)    Pt states "all narcotic pain medications make me sick to my stomach and sometimes make me woozy   Actonel [Risedronate Sodium]     Stomach upset   Adhesive [Tape] Other (See Comments)    Irritates skin    Lidocaine     Causes vasoconstriction per patient   Methotrexate Derivatives     Elevated liver enzymes   Objective:   Vitals:   08/11/22 1152  BP: 130/70   There is no height or weight on file to calculate BMI. Constitutional Well developed. Well nourished.  Vascular Dorsalis pedis pulses palpable bilaterally. Posterior tibial pulses palpable bilaterally. Capillary refill normal to all digits.  No cyanosis or clubbing noted. Pedal hair growth normal.  Neurologic Normal speech. Oriented to person, place, and time. Epicritic sensation to light touch grossly present bilaterally.  Dermatologic Nails well groomed and normal in appearance. No open wounds. No skin lesions.  Orthopedic: Hammertoe contracture of right 3 through 5 that appears to be more semiflexible in nature with tightness of the flexor tendon.  Semirigid deformity noted of right second digit.  No contractures noted at the metatarsophalangeal joint.   Radiographs: 3 views of skinTrauma chart right foot: Hammertoe contractures  digits 2 through 5 noted.  Previous hardware noted.  Severe pes planovalgus foot deformity noted.  Previous bunionectomy noted.  Flexion contracture noted of the digits 2 through 5. Assessment:   1. Hammertoe of right foot   2. Encounter for preoperative examination for general surgical procedure    Plan:  Patient was evaluated and treated and all questions answered.  Right digits 2 through 5 hammertoe contracture -All questions and concerns were discussed with the patient extensive detail.  At this time given that there is some flexibility to digits 3 through 5 I believe that would benefit from flexor tenotomy.  Right  second toe appears to be more semirigid and will benefit from arthroplasty of the second digit.  I discussed my preoperative intraoperative postoperative plan in extensive detail with the patient she states understand would like to proceed with the surgery.  I have also consented her for arthroplasty of digits 2 through 5 if needed but I will make that decision intraoperatively -Informed surgical risk consent was reviewed and read aloud to the patient.  I reviewed the films.  I have discussed my findings with the patient in great detail.  I have discussed all risks including but not limited to infection, stiffness, scarring, limp, disability, deformity, damage to blood vessels and nerves, numbness, poor healing, need for braces, arthritis, chronic pain, amputation, death.  All benefits and realistic expectations discussed in great detail.  I have made no promises as to the outcome.  I have provided realistic expectations.  I have offered the patient a 2nd opinion, which they have declined and assured me they preferred to proceed despite the risks   No follow-ups on file.   Right flexor tenotomy of 3 thru 5 vs hammertoe arthroplasty   Right second hammertoe arthroplasty

## 2022-08-16 ENCOUNTER — Ambulatory Visit: Payer: Medicare Other | Admitting: Podiatry

## 2022-08-18 ENCOUNTER — Telehealth: Payer: Self-pay | Admitting: Podiatry

## 2022-08-18 NOTE — Telephone Encounter (Signed)
DOS: 09/18/2022  [employer] Medicare Effective 08/07/2022  Hammertoe Repair 2-5 Rt ([id number]) Tenotomy Repair 3-5 Rt ([id number])  Deductible: $0 Out-of-Pocket: $3,600 with $0 met CoInsurance: 0%  Notification or Prior Authorization is not required for the requested services  You are not required to submit a notification/prior authorization based on the information provided. The number above acknowledges your inquiry and our response. Please write this number down and refer to it for future inquiries. If you still wish to submit your request for review, please select the Continue with Submission button below.  Decision ID #:[id number]  The number above acknowledges your inquiry and our response. Please write this number down and refer to it for future inquiries. Coverage and payment for an item or service is governed by the member's benefit plan document, and, if applicable, the provider's participation agreement with the Health Plan.

## 2022-08-23 DIAGNOSIS — M25579 Pain in unspecified ankle and joints of unspecified foot: Secondary | ICD-10-CM | POA: Diagnosis not present

## 2022-08-23 DIAGNOSIS — M79671 Pain in right foot: Secondary | ICD-10-CM | POA: Diagnosis not present

## 2022-08-23 DIAGNOSIS — M1990 Unspecified osteoarthritis, unspecified site: Secondary | ICD-10-CM | POA: Diagnosis not present

## 2022-09-11 ENCOUNTER — Telehealth: Payer: Self-pay | Admitting: Family Medicine

## 2022-09-11 ENCOUNTER — Other Ambulatory Visit: Payer: Self-pay | Admitting: Family Medicine

## 2022-09-11 DIAGNOSIS — H16223 Keratoconjunctivitis sicca, not specified as Sjogren's, bilateral: Secondary | ICD-10-CM | POA: Diagnosis not present

## 2022-09-11 DIAGNOSIS — H01002 Unspecified blepharitis right lower eyelid: Secondary | ICD-10-CM | POA: Diagnosis not present

## 2022-09-11 DIAGNOSIS — H401121 Primary open-angle glaucoma, left eye, mild stage: Secondary | ICD-10-CM | POA: Diagnosis not present

## 2022-09-11 DIAGNOSIS — H01001 Unspecified blepharitis right upper eyelid: Secondary | ICD-10-CM | POA: Diagnosis not present

## 2022-09-11 NOTE — Telephone Encounter (Signed)
Prescription for a walker diagnosis ataxia, weakness, leg pain faxed to [facility].

## 2022-09-11 NOTE — Telephone Encounter (Signed)
May go ahead and write out a prescription for a walker diagnosis ataxia, weakness, leg pain Write out the prescription placed on my door I will sign it we will send it to [facility]

## 2022-09-11 NOTE — Telephone Encounter (Signed)
Patient is requesting prescription for a walker for when she has ankle surgery coming up this week. Lovena Neighbours to [facility]

## 2022-09-12 ENCOUNTER — Telehealth: Payer: Self-pay | Admitting: Family Medicine

## 2022-09-12 NOTE — Telephone Encounter (Signed)
Completed thank you

## 2022-09-12 NOTE — Telephone Encounter (Signed)
Personally I would have hoped that the surgeon would have prescribed this  Work with the patient and see what she needs  send in a prescription I can sign off on the prescription  As for Medicare I am not sure that they are working with [employer]

## 2022-09-12 NOTE — Telephone Encounter (Signed)
Please advise. Thank you

## 2022-09-12 NOTE — Telephone Encounter (Signed)
Patient needs a knee walker instead just a plain walker ,she needs the one with wheels on it so she can get around after her surgery on Monday. [employer]

## 2022-09-12 NOTE — Telephone Encounter (Signed)
Script written out. Contacted [facility] and they state a script is not really needed because they will not be able to [employer]. Pt is aware of this and told [place] that if we call, to tell us to go ahead and send script.

## 2022-09-13 NOTE — Telephone Encounter (Signed)
Script faxed to [facility] 

## 2022-09-18 ENCOUNTER — Encounter: Payer: Self-pay | Admitting: Podiatry

## 2022-09-18 ENCOUNTER — Other Ambulatory Visit: Payer: Self-pay | Admitting: Podiatry

## 2022-09-18 DIAGNOSIS — M24574 Contracture, right foot: Secondary | ICD-10-CM | POA: Diagnosis not present

## 2022-09-18 DIAGNOSIS — M2041 Other hammer toe(s) (acquired), right foot: Secondary | ICD-10-CM | POA: Diagnosis not present

## 2022-09-18 MED ORDER — IBUPROFEN 800 MG PO TABS: 800.0000 mg | ORAL_TABLET | Freq: Four times a day (QID) | ORAL | 1 refills | Status: DC | PRN

## 2022-09-18 MED ORDER — HYDROMORPHONE HCL 2 MG PO TABS: 2.0000 mg | ORAL_TABLET | ORAL | 0 refills | Status: DC | PRN

## 2022-09-18 MED ORDER — OXYCODONE-ACETAMINOPHEN 5-325 MG PO TABS: 1.0000 | ORAL_TABLET | ORAL | 0 refills | Status: DC | PRN

## 2022-09-19 ENCOUNTER — Other Ambulatory Visit: Payer: Self-pay | Admitting: Family Medicine

## 2022-09-26 ENCOUNTER — Ambulatory Visit (INDEPENDENT_AMBULATORY_CARE_PROVIDER_SITE_OTHER): Payer: Medicare Other | Admitting: Podiatry

## 2022-09-26 ENCOUNTER — Ambulatory Visit (INDEPENDENT_AMBULATORY_CARE_PROVIDER_SITE_OTHER): Payer: Medicare Other

## 2022-09-26 DIAGNOSIS — M2041 Other hammer toe(s) (acquired), right foot: Secondary | ICD-10-CM

## 2022-09-26 DIAGNOSIS — Z9889 Other specified postprocedural states: Secondary | ICD-10-CM

## 2022-09-26 NOTE — Progress Notes (Signed)
Subjective:  Patient ID: Sabrina Adams, female    DOB: 07/19/1946,  MRN: [id number]  Chief Complaint  Patient presents with   Routine Post Op    DOS 09/18/2022 RT 2ND HAMMERTOE ARTHROPLASTY, RT 3-5 FLEXOR TENOTOMY VS ARTHROPLASTY OF HAMMERTOE    DOS: 09/18/2022 Procedure: Right second hammertoe arthroplasty with right 3-5 flexor tenotomy  77 y.o. female returns for post-op check.  Patient states she is doing well.  Pain is controlled.  Ambulating with surgical shoe.  Bandages clean dry and intact  Review of Systems: Negative except as noted in the HPI. Denies N/V/F/Ch.  Past Medical History:  Diagnosis Date   Anemia    Arthritis    rheumatoid   Complication of anesthesia    nausea ,  psycotics episodes   GERD (gastroesophageal reflux disease)    Hypertension    Hypothyroidism    Neuromuscular disorder (HCC)    neuropathy  legs   PONV (postoperative nausea and vomiting)    RA (rheumatoid arthritis) (HCC)    Sleep apnea    Stroke (HCC)    mild   TMJ (dislocation of temporomandibular joint)     Current Outpatient Medications:    acetaminophen (TYLENOL) 650 MG CR tablet, Take 1,300 mg by mouth 2 (two) times daily., Disp: , Rfl:    amLODipine (NORVASC) 5 MG tablet, TAKE 1 TABLET BY MOUTH DAILY, Disp: 100 tablet, Rfl: 1   b complex vitamins tablet, Take 1 tablet by mouth daily., Disp: , Rfl:    Calcium Carbonate-Vitamin D (CALCIUM 600 + D PO), Take 1 tablet by mouth daily., Disp: , Rfl:    Cholecalciferol (VITAMIN D3) 125 MCG (5000 UT) CAPS, Take 5,000 Units by mouth once a week., Disp: , Rfl:    docusate sodium (COLACE) 100 MG capsule, Take 200 mg by mouth 2 (two) times daily., Disp: , Rfl:    gabapentin (NEURONTIN) 300 MG capsule, TAKE 1 CAPSULE BY MOUTH TWICE  DAILY . THIRD DOSE IF NEEDED, Disp: 300 capsule, Rfl: 2   hydrocortisone 1 % ointment, Apply 1 application  topically 2 (two) times daily as needed for itching., Disp: , Rfl:    HYDROmorphone (DILAUDID) 2 MG  tablet, One po three times daily as needed, Disp: 10 tablet, Rfl: 0   HYDROmorphone (DILAUDID) 2 MG tablet, Take 1 tablet (2 mg total) by mouth every 4 (four) hours as needed for severe pain., Disp: 30 tablet, Rfl: 0   hydroxychloroquine (PLAQUENIL) 200 MG tablet, Take 1 tablet (200 mg total) by mouth 2 (two) times daily., Disp: 180 tablet, Rfl: 1   ibuprofen (ADVIL) 800 MG tablet, Take 1 tablet (800 mg total) by mouth every 6 (six) hours as needed., Disp: 60 tablet, Rfl: 1   levothyroxine (SYNTHROID) 112 MCG tablet, TAKE 1 TABLET BY MOUTH DAILY, Disp: 100 tablet, Rfl: 2   Lysine 500 MG TABS, Take 500 mg by mouth daily as needed., Disp: , Rfl:    Multiple Vitamin (MULTIVITAMIN WITH MINERALS) TABS, Take 1 tablet by mouth daily., Disp: , Rfl:    olmesartan (BENICAR) 5 MG tablet, TAKE 1 TABLET BY MOUTH DAILY, Disp: 100 tablet, Rfl: 1   ondansetron (ZOFRAN) 4 MG tablet, TAKE (1) TABLET BY MOUTH (3) TIMES DAILY AS NEEDED FOR NAUSEA, Disp: 12 tablet, Rfl: 5   oxyCODONE-acetaminophen (PERCOCET) 5-325 MG tablet, Take 1 tablet by mouth every 4 (four) hours as needed for severe pain., Disp: 30 tablet, Rfl: 0   pantoprazole (PROTONIX) 40 MG tablet, TAKE 1 TABLET  BY MOUTH DAILY  BEFORE BREAKFAST, Disp: 100 tablet, Rfl: 2   polycarbophil (FIBERCON) 625 MG tablet, Take 625 mg by mouth in the morning and at bedtime., Disp: , Rfl:    polyethylene glycol (MIRALAX / GLYCOLAX) packet, Take 17 g by mouth daily as needed for mild constipation. Constipation, Disp: , Rfl:    predniSONE (DELTASONE) 2.5 MG tablet, Take 3 tablets (7.5 mg total) by mouth daily with breakfast., Disp: 15 tablet, Rfl: 0   Probiotic Product (PROBIOTIC DAILY PO), Take 1 capsule by mouth daily., Disp: , Rfl:    promethazine (PHENERGAN) 25 MG tablet, Take 12.5 mg po qhs prn, Disp: 90 tablet, Rfl: 2   Propylene Glycol (SYSTANE BALANCE OP), Place 1 drop into both eyes 2 (two) times daily., Disp: , Rfl:    rosuvastatin (CRESTOR) 10 MG tablet, Take one  on M W F, Disp: 36 tablet, Rfl: 3   terbinafine (LAMISIL) 1 % cream, Apply 1 application topically 2 (two) times daily as needed (antifungal)., Disp: , Rfl:    timolol (TIMOPTIC) 0.5 % ophthalmic solution, Place 1 drop into both eyes daily., Disp: , Rfl:    traMADol (ULTRAM) 50 MG tablet, TAKE (1) TABLET BY MOUTH (4) TIMES DAILY., Disp: 120 tablet, Rfl: 4   Zoledronic Acid (RECLAST IV), Inject into the vein. yearly, Disp: , Rfl:   Social History   Tobacco Use  Smoking Status Never  Smokeless Tobacco Never    Allergies  Allergen Reactions   Betadine [Povidone Iodine] Itching    "Burns"   Cinnamon Anaphylaxis    Throat and Tongue    Codeine Nausea Only    Patient can become woozy   Other Nausea And Vomiting and Other (See Comments)    Pt states "all narcotic pain medications make me sick to my stomach and sometimes make me woozy   Actonel [Risedronate Sodium]     Stomach upset   Adhesive [Tape] Other (See Comments)    Irritates skin    Lidocaine     Causes vasoconstriction per patient   Methotrexate Derivatives     Elevated liver enzymes   Objective:  There were no vitals filed for this visit. There is no height or weight on file to calculate BMI. Constitutional Well developed. Well nourished.  Vascular Foot warm and well perfused. Capillary refill normal to all digits.   Neurologic Normal speech. Oriented to person, place, and time. Epicritic sensation to light touch grossly present bilaterally.  Dermatologic Skin healing well without signs of infection. Skin edges well coapted without signs of infection.  Orthopedic: Tenderness to palpation noted about the surgical site.   Radiographs: 3 views of skeletally mature the right foot: Good correction alignment noted good position noted.  Hardware is intact no signs of backing out or loosening noted. Assessment:   1. Hammertoe of right foot   2. Status post foot surgery    Plan:  Patient was evaluated and treated and all  questions answered.  S/p foot surgery right -Progressing as expected post-operatively. -XR: See above -WB Status: Weightbearing as tolerated in surgical shoe -Sutures: Intact.  No clinical signs of Deis is noted no complication noted. -Medications: None -Foot redressed.  No follow-ups on file.

## 2022-10-06 ENCOUNTER — Other Ambulatory Visit: Payer: Self-pay | Admitting: Family Medicine

## 2022-10-06 DIAGNOSIS — I10 Essential (primary) hypertension: Secondary | ICD-10-CM

## 2022-10-09 DIAGNOSIS — H16223 Keratoconjunctivitis sicca, not specified as Sjogren's, bilateral: Secondary | ICD-10-CM | POA: Diagnosis not present

## 2022-10-09 DIAGNOSIS — H401121 Primary open-angle glaucoma, left eye, mild stage: Secondary | ICD-10-CM | POA: Diagnosis not present

## 2022-10-09 DIAGNOSIS — H01001 Unspecified blepharitis right upper eyelid: Secondary | ICD-10-CM | POA: Diagnosis not present

## 2022-10-09 DIAGNOSIS — H01002 Unspecified blepharitis right lower eyelid: Secondary | ICD-10-CM | POA: Diagnosis not present

## 2022-10-10 ENCOUNTER — Ambulatory Visit (INDEPENDENT_AMBULATORY_CARE_PROVIDER_SITE_OTHER): Payer: Medicare Other

## 2022-10-10 DIAGNOSIS — M2041 Other hammer toe(s) (acquired), right foot: Secondary | ICD-10-CM

## 2022-10-10 NOTE — Progress Notes (Signed)
Patient presents today for post op visit # 2, patient of Dr. Posey Pronto.   POV #2 DOS 09/18/2022 RT 2ND HAMMERTOE ARTHROPLASTY, RT 3-5 FLEXOR TENOTOMY VS ARTHROPLASTY OF HAMMERTOE    Sutures removed today without complication.  Incisions look good and no signs of infections.    Reviewed icing and elevation. Patient will follow up with Dr. Posey Pronto  for POV# 3 in 3 weeks.

## 2022-11-01 ENCOUNTER — Ambulatory Visit (INDEPENDENT_AMBULATORY_CARE_PROVIDER_SITE_OTHER): Payer: Medicare Other

## 2022-11-01 ENCOUNTER — Ambulatory Visit (INDEPENDENT_AMBULATORY_CARE_PROVIDER_SITE_OTHER): Payer: Medicare Other | Admitting: Podiatry

## 2022-11-01 DIAGNOSIS — Z9889 Other specified postprocedural states: Secondary | ICD-10-CM

## 2022-11-01 DIAGNOSIS — M2041 Other hammer toe(s) (acquired), right foot: Secondary | ICD-10-CM

## 2022-11-01 NOTE — Progress Notes (Signed)
Subjective:  Patient ID: Sabrina Adams, female    DOB: August 22, 1945,  MRN: [id number]  No chief complaint on file.   DOS: 09/18/2022 Procedure: Right second hammertoe arthroplasty with right 3-5 flexor tenotomy  77 y.o. female returns for post-op check.  Patient states she is doing well.  Pain is controlled.  Ambulating with surgical shoe.  Bandages clean dry and intact  Review of Systems: Negative except as noted in the HPI. Denies N/V/F/Ch.  Past Medical History:  Diagnosis Date   Anemia    Arthritis    rheumatoid   Complication of anesthesia    nausea ,  psycotics episodes   GERD (gastroesophageal reflux disease)    Hypertension    Hypothyroidism    Neuromuscular disorder (HCC)    neuropathy  legs   PONV (postoperative nausea and vomiting)    RA (rheumatoid arthritis) (HCC)    Sleep apnea    Stroke (HCC)    mild   TMJ (dislocation of temporomandibular joint)     Current Outpatient Medications:    acetaminophen (TYLENOL) 650 MG CR tablet, Take 1,300 mg by mouth 2 (two) times daily., Disp: , Rfl:    amLODipine (NORVASC) 5 MG tablet, TAKE 1 TABLET BY MOUTH DAILY, Disp: 100 tablet, Rfl: 2   b complex vitamins tablet, Take 1 tablet by mouth daily., Disp: , Rfl:    Calcium Carbonate-Vitamin D (CALCIUM 600 + D PO), Take 1 tablet by mouth daily., Disp: , Rfl:    Cholecalciferol (VITAMIN D3) 125 MCG (5000 UT) CAPS, Take 5,000 Units by mouth once a week., Disp: , Rfl:    docusate sodium (COLACE) 100 MG capsule, Take 200 mg by mouth 2 (two) times daily., Disp: , Rfl:    gabapentin (NEURONTIN) 300 MG capsule, TAKE 1 CAPSULE BY MOUTH TWICE  DAILY . THIRD DOSE IF NEEDED, Disp: 300 capsule, Rfl: 2   hydrocortisone 1 % ointment, Apply 1 application  topically 2 (two) times daily as needed for itching., Disp: , Rfl:    HYDROmorphone (DILAUDID) 2 MG tablet, One po three times daily as needed, Disp: 10 tablet, Rfl: 0   HYDROmorphone (DILAUDID) 2 MG tablet, Take 1 tablet (2 mg total) by  mouth every 4 (four) hours as needed for severe pain., Disp: 30 tablet, Rfl: 0   hydroxychloroquine (PLAQUENIL) 200 MG tablet, Take 1 tablet (200 mg total) by mouth 2 (two) times daily., Disp: 180 tablet, Rfl: 1   ibuprofen (ADVIL) 800 MG tablet, Take 1 tablet (800 mg total) by mouth every 6 (six) hours as needed., Disp: 60 tablet, Rfl: 1   levothyroxine (SYNTHROID) 112 MCG tablet, TAKE 1 TABLET BY MOUTH DAILY, Disp: 100 tablet, Rfl: 2   Lysine 500 MG TABS, Take 500 mg by mouth daily as needed., Disp: , Rfl:    Multiple Vitamin (MULTIVITAMIN WITH MINERALS) TABS, Take 1 tablet by mouth daily., Disp: , Rfl:    olmesartan (BENICAR) 5 MG tablet, TAKE 1 TABLET BY MOUTH DAILY, Disp: 100 tablet, Rfl: 2   ondansetron (ZOFRAN) 4 MG tablet, TAKE (1) TABLET BY MOUTH (3) TIMES DAILY AS NEEDED FOR NAUSEA, Disp: 12 tablet, Rfl: 5   oxyCODONE-acetaminophen (PERCOCET) 5-325 MG tablet, Take 1 tablet by mouth every 4 (four) hours as needed for severe pain., Disp: 30 tablet, Rfl: 0   pantoprazole (PROTONIX) 40 MG tablet, TAKE 1 TABLET BY MOUTH DAILY  BEFORE BREAKFAST, Disp: 100 tablet, Rfl: 2   polycarbophil (FIBERCON) 625 MG tablet, Take 625 mg by mouth in  the morning and at bedtime., Disp: , Rfl:    polyethylene glycol (MIRALAX / GLYCOLAX) packet, Take 17 g by mouth daily as needed for mild constipation. Constipation, Disp: , Rfl:    predniSONE (DELTASONE) 2.5 MG tablet, Take 3 tablets (7.5 mg total) by mouth daily with breakfast., Disp: 15 tablet, Rfl: 0   Probiotic Product (PROBIOTIC DAILY PO), Take 1 capsule by mouth daily., Disp: , Rfl:    promethazine (PHENERGAN) 25 MG tablet, TAKE ONE-HALF TABLET BY MOUTH AT BEDTIME AS NEEDED, Disp: 50 tablet, Rfl: 0   Propylene Glycol (SYSTANE BALANCE OP), Place 1 drop into both eyes 2 (two) times daily., Disp: , Rfl:    rosuvastatin (CRESTOR) 10 MG tablet, TAKE 1 TABLET BY MOUTH ON  MONDAY, WEDNESDAY, AND FRIDAY, Disp: 43 tablet, Rfl: 2   terbinafine (LAMISIL) 1 % cream,  Apply 1 application topically 2 (two) times daily as needed (antifungal)., Disp: , Rfl:    timolol (TIMOPTIC) 0.5 % ophthalmic solution, Place 1 drop into both eyes daily., Disp: , Rfl:    traMADol (ULTRAM) 50 MG tablet, TAKE (1) TABLET BY MOUTH (4) TIMES DAILY., Disp: 120 tablet, Rfl: 4   Zoledronic Acid (RECLAST IV), Inject into the vein. yearly, Disp: , Rfl:   Social History   Tobacco Use  Smoking Status Never  Smokeless Tobacco Never    Allergies  Allergen Reactions   Betadine [Povidone Iodine] Itching    "Burns"   Cinnamon Anaphylaxis    Throat and Tongue    Codeine Nausea Only    Patient can become woozy   Other Nausea And Vomiting and Other (See Comments)    Pt states "all narcotic pain medications make me sick to my stomach and sometimes make me woozy   Actonel [Risedronate Sodium]     Stomach upset   Adhesive [Tape] Other (See Comments)    Irritates skin    Lidocaine     Causes vasoconstriction per patient   Methotrexate Derivatives     Elevated liver enzymes   Objective:  There were no vitals filed for this visit. There is no height or weight on file to calculate BMI. Constitutional Well developed. Well nourished.  Vascular Foot warm and well perfused. Capillary refill normal to all digits.   Neurologic Normal speech. Oriented to person, place, and time. Epicritic sensation to light touch grossly present bilaterally.  Dermatologic Skin completely epithelialized.  No signs of Deis syndrome no complication noted.  Reduction of hammertoe deformity noted 2-3 4 5     Orthopedic: No further tenderness to palpation noted about the surgical site.   Radiographs: 3 views of skeletally mature the right foot: Good correction alignment noted good position noted.  Hardware is intact no signs of backing out or loosening noted. Assessment:   1. Status post foot surgery    Plan:  Patient was evaluated and treated and all questions answered.  S/p foot surgery  right -Clinically healed and officially discharged from my care the pin was removed.  Rectus alignment of the second toe noted.  Good correction alignment noted of rest of the digits as well.  If any foot and ankle issues or in the future she will come back and see me -I gave her prescription for [facility] to obtain Custom insoles to be accommodative t to the new foot alignment  No follow-ups on file.

## 2022-11-07 DIAGNOSIS — M1990 Unspecified osteoarthritis, unspecified site: Secondary | ICD-10-CM | POA: Diagnosis not present

## 2022-11-07 DIAGNOSIS — M25439 Effusion, unspecified wrist: Secondary | ICD-10-CM | POA: Diagnosis not present

## 2022-11-07 DIAGNOSIS — M0579 Rheumatoid arthritis with rheumatoid factor of multiple sites without organ or systems involvement: Secondary | ICD-10-CM | POA: Diagnosis not present

## 2022-11-07 DIAGNOSIS — M79643 Pain in unspecified hand: Secondary | ICD-10-CM | POA: Diagnosis not present

## 2022-11-07 DIAGNOSIS — M549 Dorsalgia, unspecified: Secondary | ICD-10-CM | POA: Diagnosis not present

## 2022-11-07 DIAGNOSIS — M2550 Pain in unspecified joint: Secondary | ICD-10-CM | POA: Diagnosis not present

## 2022-11-07 DIAGNOSIS — M79671 Pain in right foot: Secondary | ICD-10-CM | POA: Diagnosis not present

## 2022-11-07 DIAGNOSIS — Z7952 Long term (current) use of systemic steroids: Secondary | ICD-10-CM | POA: Diagnosis not present

## 2022-11-07 DIAGNOSIS — M810 Age-related osteoporosis without current pathological fracture: Secondary | ICD-10-CM | POA: Diagnosis not present

## 2022-11-21 DIAGNOSIS — M810 Age-related osteoporosis without current pathological fracture: Secondary | ICD-10-CM | POA: Diagnosis not present

## 2022-11-28 ENCOUNTER — Telehealth: Payer: Self-pay | Admitting: Family Medicine

## 2022-11-28 DIAGNOSIS — I10 Essential (primary) hypertension: Secondary | ICD-10-CM

## 2022-11-28 DIAGNOSIS — E038 Other specified hypothyroidism: Secondary | ICD-10-CM

## 2022-11-28 DIAGNOSIS — Z79899 Other long term (current) drug therapy: Secondary | ICD-10-CM

## 2022-11-28 DIAGNOSIS — E782 Mixed hyperlipidemia: Secondary | ICD-10-CM

## 2022-11-28 NOTE — Telephone Encounter (Signed)
Patient wants know if she needs labs done for appointment on 4/26. Please advise

## 2022-11-29 NOTE — Telephone Encounter (Signed)
Blood work ordered in EPIC.  Left message to return call to notify patient 

## 2022-11-29 NOTE — Telephone Encounter (Signed)
Metabolic 7, urine ACR, lipid, liver, TSH Hyperlipidemia hypertension hypothyroidism Please make sure patient is aware urine test is to look for protein

## 2022-11-29 NOTE — Telephone Encounter (Signed)
Patient notified

## 2022-11-30 ENCOUNTER — Ambulatory Visit: Payer: Medicare Other | Admitting: Family Medicine

## 2022-11-30 DIAGNOSIS — E038 Other specified hypothyroidism: Secondary | ICD-10-CM | POA: Diagnosis not present

## 2022-11-30 DIAGNOSIS — M0579 Rheumatoid arthritis with rheumatoid factor of multiple sites without organ or systems involvement: Secondary | ICD-10-CM | POA: Diagnosis not present

## 2022-11-30 DIAGNOSIS — I10 Essential (primary) hypertension: Secondary | ICD-10-CM | POA: Diagnosis not present

## 2022-11-30 DIAGNOSIS — Z79899 Other long term (current) drug therapy: Secondary | ICD-10-CM | POA: Diagnosis not present

## 2022-11-30 DIAGNOSIS — M2550 Pain in unspecified joint: Secondary | ICD-10-CM | POA: Diagnosis not present

## 2022-11-30 DIAGNOSIS — E782 Mixed hyperlipidemia: Secondary | ICD-10-CM | POA: Diagnosis not present

## 2022-11-30 DIAGNOSIS — Z7952 Long term (current) use of systemic steroids: Secondary | ICD-10-CM | POA: Diagnosis not present

## 2022-12-01 ENCOUNTER — Ambulatory Visit (INDEPENDENT_AMBULATORY_CARE_PROVIDER_SITE_OTHER): Payer: Medicare Other | Admitting: Family Medicine

## 2022-12-01 VITALS — BP 132/76 | HR 61 | Ht 65.0 in | Wt 161.8 lb

## 2022-12-01 DIAGNOSIS — G894 Chronic pain syndrome: Secondary | ICD-10-CM | POA: Diagnosis not present

## 2022-12-01 DIAGNOSIS — I517 Cardiomegaly: Secondary | ICD-10-CM

## 2022-12-01 DIAGNOSIS — E038 Other specified hypothyroidism: Secondary | ICD-10-CM

## 2022-12-01 DIAGNOSIS — M0579 Rheumatoid arthritis with rheumatoid factor of multiple sites without organ or systems involvement: Secondary | ICD-10-CM

## 2022-12-01 DIAGNOSIS — I10 Essential (primary) hypertension: Secondary | ICD-10-CM | POA: Diagnosis not present

## 2022-12-01 DIAGNOSIS — I700 Atherosclerosis of aorta: Secondary | ICD-10-CM

## 2022-12-01 DIAGNOSIS — E782 Mixed hyperlipidemia: Secondary | ICD-10-CM | POA: Diagnosis not present

## 2022-12-01 DIAGNOSIS — I4510 Unspecified right bundle-branch block: Secondary | ICD-10-CM

## 2022-12-01 LAB — BASIC METABOLIC PANEL
BUN/Creatinine Ratio: 20 (ref 12–28)
BUN: 17 mg/dL (ref 8–27)
CO2: 23 mmol/L (ref 20–29)
Calcium: 10.1 mg/dL (ref 8.7–10.3)
Chloride: 101 mmol/L (ref 96–106)
Creatinine, Ser: 0.85 mg/dL (ref 0.57–1.00)
Glucose: 103 mg/dL — ABNORMAL HIGH (ref 70–99)
Potassium: 5.3 mmol/L — ABNORMAL HIGH (ref 3.5–5.2)
Sodium: 141 mmol/L (ref 134–144)
eGFR: 72 mL/min/{1.73_m2} (ref >59)

## 2022-12-01 LAB — LIPID PANEL
Chol/HDL Ratio: 1.9 ratio (ref 0.0–4.4)
Cholesterol, Total: 187 mg/dL (ref 100–199)
HDL: 97 mg/dL (ref >39)
LDL Chol Calc (NIH): 73 mg/dL (ref 0–99)
Triglycerides: 98 mg/dL (ref 0–149)
VLDL Cholesterol Cal: 17 mg/dL (ref 5–40)

## 2022-12-01 LAB — HEPATIC FUNCTION PANEL
ALT: 21 IU/L (ref 0–32)
AST: 39 IU/L (ref 0–40)
Albumin: 4.6 g/dL (ref 3.8–4.8)
Alkaline Phosphatase: 93 IU/L (ref 44–121)
Bilirubin Total: 0.4 mg/dL (ref 0.0–1.2)
Bilirubin, Direct: 0.12 mg/dL (ref 0.00–0.40)
Total Protein: 7.1 g/dL (ref 6.0–8.5)

## 2022-12-01 LAB — MICROALBUMIN / CREATININE URINE RATIO
Creatinine, Urine: 53.3 mg/dL
Microalb/Creat Ratio: 16 mg/g creat (ref 0–29)
Microalbumin, Urine: 8.4 ug/mL

## 2022-12-01 LAB — TSH: TSH: 0.898 u[IU]/mL (ref 0.450–4.500)

## 2022-12-01 MED ORDER — OXYBUTYNIN CHLORIDE ER 5 MG PO TB24: 5.0000 mg | ORAL_TABLET | Freq: Every day | ORAL | 5 refills | Status: DC

## 2022-12-01 MED ORDER — TRAMADOL HCL 50 MG PO TABS: ORAL_TABLET | ORAL | 4 refills | Status: DC

## 2022-12-01 NOTE — Progress Notes (Addendum)
Subjective:    Patient ID: Sabrina Adams, female    DOB: 12-23-1945, 77 y.o.   MRN: [id number]  HPI Patient arrives today for 4 month follow up. Patient is having issues with bladder leakage. Some issues with bladder leakage.  Finds that urine tends to leak out with standing up sometimes with movement sometimes with when she gets up in the morning We did discuss options in detail Outpatient Encounter Medications as of 12/01/2022  Medication Sig Note   acetaminophen (TYLENOL) 650 MG CR tablet Take 1,300 mg by mouth 2 (two) times daily.    amLODipine (NORVASC) 5 MG tablet TAKE 1 TABLET BY MOUTH DAILY    b complex vitamins tablet Take 1 tablet by mouth daily.    Calcium Carbonate-Vitamin D (CALCIUM 600 + D PO) Take 1 tablet by mouth daily.    Cholecalciferol (VITAMIN D3) 125 MCG (5000 UT) CAPS Take 5,000 Units by mouth once a week.    denosumab (PROLIA) 60 MG/ML SOSY injection Inject 60 mg into the skin every 6 (six) months.    docusate sodium (COLACE) 100 MG capsule Take 200 mg by mouth 2 (two) times daily. 06/27/2021: One prn   gabapentin (NEURONTIN) 300 MG capsule TAKE 1 CAPSULE BY MOUTH TWICE  DAILY . THIRD DOSE IF NEEDED    hydrocortisone 1 % ointment Apply 1 application  topically 2 (two) times daily as needed for itching.    HYDROmorphone (DILAUDID) 2 MG tablet One po three times daily as needed    hydroxychloroquine (PLAQUENIL) 200 MG tablet Take 1 tablet (200 mg total) by mouth 2 (two) times daily.    levothyroxine (SYNTHROID) 112 MCG tablet TAKE 1 TABLET BY MOUTH DAILY    Lysine 500 MG TABS Take 500 mg by mouth daily as needed.    Multiple Vitamin (MULTIVITAMIN WITH MINERALS) TABS Take 1 tablet by mouth daily.    olmesartan (BENICAR) 5 MG tablet TAKE 1 TABLET BY MOUTH DAILY    ondansetron (ZOFRAN) 4 MG tablet TAKE (1) TABLET BY MOUTH (3) TIMES DAILY AS NEEDED FOR NAUSEA    oxybutynin (DITROPAN XL) 5 MG 24 hr tablet Take 1 tablet (5 mg total) by mouth at bedtime.     pantoprazole (PROTONIX) 40 MG tablet TAKE 1 TABLET BY MOUTH DAILY  BEFORE BREAKFAST    polycarbophil (FIBERCON) 625 MG tablet Take 625 mg by mouth in the morning and at bedtime.    polyethylene glycol (MIRALAX / GLYCOLAX) packet Take 17 g by mouth daily as needed for mild constipation. Constipation    predniSONE (DELTASONE) 2.5 MG tablet Take 3 tablets (7.5 mg total) by mouth daily with breakfast.    Probiotic Product (PROBIOTIC DAILY PO) Take 1 capsule by mouth daily.    promethazine (PHENERGAN) 25 MG tablet TAKE ONE-HALF TABLET BY MOUTH AT BEDTIME AS NEEDED    Propylene Glycol (SYSTANE BALANCE OP) Place 1 drop into both eyes 2 (two) times daily. 11/02/2020: Patient states that she uses as needed during the day   rosuvastatin (CRESTOR) 10 MG tablet TAKE 1 TABLET BY MOUTH ON  MONDAY, WEDNESDAY, AND FRIDAY    terbinafine (LAMISIL) 1 % cream Apply 1 application topically 2 (two) times daily as needed (antifungal).    timolol (TIMOPTIC) 0.5 % ophthalmic solution Place 1 drop into both eyes daily.    [DISCONTINUED] ibuprofen (ADVIL) 800 MG tablet Take 1 tablet (800 mg total) by mouth every 6 (six) hours as needed.    [DISCONTINUED] oxyCODONE-acetaminophen (PERCOCET) 5-325 MG tablet Take  1 tablet by mouth every 4 (four) hours as needed for severe pain.    [DISCONTINUED] traMADol (ULTRAM) 50 MG tablet TAKE (1) TABLET BY MOUTH (4) TIMES DAILY.    [DISCONTINUED] Zoledronic Acid (RECLAST IV) Inject into the vein. yearly    traMADol (ULTRAM) 50 MG tablet One qid prn pain    [DISCONTINUED] HYDROmorphone (DILAUDID) 2 MG tablet Take 1 tablet (2 mg total) by mouth every 4 (four) hours as needed for severe pain.    No facility-administered encounter medications on file as of 12/01/2022.    Results for orders placed or performed in visit on 11/28/22  Basic metabolic panel  Result Value Ref Range   Glucose 103 (H) 70 - 99 mg/dL   BUN 17 8 - 27 mg/dL   Creatinine, Ser 4.09 0.57 - 1.00 mg/dL   eGFR 72 >81  XB/JYN/8.29   BUN/Creatinine Ratio 20 12 - 28   Sodium 141 134 - 144 mmol/L   Potassium 5.3 (H) 3.5 - 5.2 mmol/L   Chloride 101 96 - 106 mmol/L   CO2 23 20 - 29 mmol/L   Calcium 10.1 8.7 - 10.3 mg/dL  Lipid panel  Result Value Ref Range   Cholesterol, Total 187 100 - 199 mg/dL   Triglycerides 98 0 - 149 mg/dL   HDL 97 >56 mg/dL   VLDL Cholesterol Cal 17 5 - 40 mg/dL   LDL Chol Calc (NIH) 73 0 - 99 mg/dL   Chol/HDL Ratio 1.9 0.0 - 4.4 ratio  Hepatic function panel  Result Value Ref Range   Total Protein 7.1 6.0 - 8.5 g/dL   Albumin 4.6 3.8 - 4.8 g/dL   Bilirubin Total 0.4 0.0 - 1.2 mg/dL   Bilirubin, Direct 2.13 0.00 - 0.40 mg/dL   Alkaline Phosphatase 93 44 - 121 IU/L   AST 39 0 - 40 IU/L   ALT 21 0 - 32 IU/L  TSH  Result Value Ref Range   TSH 0.898 0.450 - 4.500 uIU/mL  Microalbumin / creatinine urine ratio  Result Value Ref Range   Creatinine, Urine 53.3 Not Estab. mg/dL   Microalbumin, Urine 8.4 Not Estab. ug/mL   Microalb/Creat Ratio 16 0 - 29 mg/g creat   This patient was seen today for chronic pain  The medication list was reviewed and updated.   Location of Pain for which the patient has been treated with regarding narcotics: Arthritic joints unable to take NSAIDs  Onset of this pain: Present for years   -Compliance with medication: Good compliance  - Number patient states they take daily: Takes 2 or 3 daily  -Reason for ongoing use of opioids arthritic joints from rheumatoid  What other measures have been tried outside of opioids in the past has tried NSAIDs  In the ongoing specialists regarding this condition rheumatologist  -when was the last dose patient took?  Earlier today  The patient was advised the importance of maintaining medication and not using illegal substances with these.  Here for refills and follow up  The patient was educated that we can provide 3 monthly scripts for their medication, it is their responsibility to follow the  instructions.  Side effects or complications from medications: Denies side effects  Patient is aware that pain medications are meant to minimize the severity of the pain to allow their pain levels to improve to allow for better function. They are aware of that pain medications cannot totally remove their pain.  Due for UDT ( at least once per  year) (pain management contract is also completed at the time of the UDT): Several years ago patient is very reliable and compliant  Scale of 1 to 10 ( 1 is least 10 is most) Your pain level without the medicine: 8 Your pain level with medication 5  Scale 1 to 10 ( 1-helps very little, 10 helps very well) How well does your pain medication reduce your pain so you can function better through out the day? 7  Quality of the pain: Throbbing aching  Persistence of the pain: Worse with activity  Modifying factors: None, activity makes it worse         Review of Systems     Objective:   Physical Exam  General-in no acute distress Eyes-no discharge Lungs-respiratory rate normal, CTA CV-no murmurs,RRR Extremities skin warm dry no edema Neuro grossly normal Behavior normal, alert She does have significant rheumatoid arthritis with trace edema in the ankle      Assessment & Plan:  1. Essential hypertension, benign HTN- patient seen for follow-up regarding HTN.   Diet, medication compliance, appropriate labs and refills were completed.   Importance of keeping blood pressure under good control to lessen the risk of complications discussed Regular follow-up visits discussed   2. Other specified hypothyroidism Patient was seen today regarding hypothyroidism.  Importance of healthy diet, regular physical activity was discussed.  Importance of compliance with medication and regular checks regarding this was discussed.    3. Chronic pain syndrome She does use tramadol to help her with pain from rheumatoid arthritis she does not abuse it  drug registry was checked  4. Mixed hyperlipidemia Hyperlipidemia-importance of diet, weight control, activity, compliance with medications discussed.   Recent labs reviewed.   Any additional labs or refills ordered.   Importance of keeping under good control discussed. Regular follow-up visits discussed   5. Incomplete right bundle branch block - EKG 12-Lead - ECHOCARDIOGRAM COMPLETE; Future On EKG there does appear to be some LVH recommend echo May need cardiology consult as well Possibility of old infarction may be a over read but nonetheless needs further looking into with echo 6. LVH (left ventricular hypertrophy) See discussion above - ECHOCARDIOGRAM COMPLETE; Future  7. Rheumatoid arthritis with rheumatoid factor of multiple sites without organ or systems involvement (HCC) Under the care of specialist Tramadol for pain  8. Atherosclerosis of aorta (HCC) On statin healthy diet Follow-up in approximately 4 months labs before that visit

## 2022-12-04 LAB — LAB REPORT - SCANNED: EGFR: 72

## 2022-12-04 NOTE — Addendum Note (Signed)
Addended by: Margaretha Sheffield on: 12/04/2022 04:40 PM   Modules accepted: Orders

## 2022-12-06 ENCOUNTER — Encounter: Payer: Self-pay | Admitting: Family Medicine

## 2022-12-07 ENCOUNTER — Other Ambulatory Visit: Payer: Self-pay | Admitting: Family Medicine

## 2022-12-07 MED ORDER — HYDROMORPHONE HCL 2 MG PO TABS: ORAL_TABLET | ORAL | 0 refills | Status: AC

## 2022-12-18 ENCOUNTER — Encounter: Payer: Self-pay | Admitting: Rheumatology

## 2022-12-27 ENCOUNTER — Encounter: Payer: Self-pay | Admitting: Family Medicine

## 2022-12-27 ENCOUNTER — Other Ambulatory Visit: Payer: Self-pay | Admitting: Family Medicine

## 2022-12-27 MED ORDER — OXYBUTYNIN CHLORIDE ER 10 MG PO TB24: 10.0000 mg | ORAL_TABLET | Freq: Every day | ORAL | 5 refills | Status: DC

## 2023-01-09 ENCOUNTER — Other Ambulatory Visit: Payer: Self-pay | Admitting: Family Medicine

## 2023-01-26 ENCOUNTER — Ambulatory Visit (HOSPITAL_COMMUNITY)
Admission: RE | Admit: 2023-01-26 | Discharge: 2023-01-26 | Disposition: A | Discharge status: Home / Self Care | Payer: Medicare Other | Source: Ambulatory Visit | Attending: Family Medicine | Admitting: Family Medicine

## 2023-01-26 DIAGNOSIS — I4510 Unspecified right bundle-branch block: Secondary | ICD-10-CM | POA: Insufficient documentation

## 2023-01-26 DIAGNOSIS — I517 Cardiomegaly: Secondary | ICD-10-CM | POA: Insufficient documentation

## 2023-01-26 DIAGNOSIS — R9431 Abnormal electrocardiogram [ECG] [EKG]: Secondary | ICD-10-CM

## 2023-01-26 LAB — ECHOCARDIOGRAM COMPLETE
Area-P 1/2: 2.76 cm2
S' Lateral: 2.2 cm

## 2023-01-26 NOTE — Progress Notes (Signed)
*  PRELIMINARY RESULTS* Echocardiogram 2D Echocardiogram has been performed.  Stacey Drain 01/26/2023, 12:21 PM

## 2023-02-15 DIAGNOSIS — Z79899 Other long term (current) drug therapy: Secondary | ICD-10-CM | POA: Diagnosis not present

## 2023-02-15 DIAGNOSIS — H01001 Unspecified blepharitis right upper eyelid: Secondary | ICD-10-CM | POA: Diagnosis not present

## 2023-02-15 DIAGNOSIS — H01002 Unspecified blepharitis right lower eyelid: Secondary | ICD-10-CM | POA: Diagnosis not present

## 2023-02-15 DIAGNOSIS — H401131 Primary open-angle glaucoma, bilateral, mild stage: Secondary | ICD-10-CM | POA: Diagnosis not present

## 2023-02-15 DIAGNOSIS — H16223 Keratoconjunctivitis sicca, not specified as Sjogren's, bilateral: Secondary | ICD-10-CM | POA: Diagnosis not present

## 2023-03-19 ENCOUNTER — Other Ambulatory Visit: Payer: Self-pay | Admitting: Family Medicine

## 2023-03-27 ENCOUNTER — Other Ambulatory Visit: Payer: Self-pay | Admitting: Family Medicine

## 2023-04-12 ENCOUNTER — Other Ambulatory Visit: Payer: Self-pay

## 2023-04-12 MED ORDER — OXYBUTYNIN CHLORIDE ER 10 MG PO TB24: 10.0000 mg | ORAL_TABLET | Freq: Every day | ORAL | 1 refills | Status: DC

## 2023-05-01 ENCOUNTER — Other Ambulatory Visit (HOSPITAL_COMMUNITY): Payer: Self-pay | Admitting: Family Medicine

## 2023-05-01 DIAGNOSIS — Z1231 Encounter for screening mammogram for malignant neoplasm of breast: Secondary | ICD-10-CM

## 2023-05-03 ENCOUNTER — Ambulatory Visit (HOSPITAL_COMMUNITY)
Admission: RE | Admit: 2023-05-03 | Discharge: 2023-05-03 | Disposition: A | Discharge status: Home / Self Care | Payer: Medicare Other | Source: Ambulatory Visit | Attending: Family Medicine | Admitting: Family Medicine

## 2023-05-03 ENCOUNTER — Ambulatory Visit (INDEPENDENT_AMBULATORY_CARE_PROVIDER_SITE_OTHER): Payer: Medicare Other | Admitting: Family Medicine

## 2023-05-03 VITALS — BP 116/71 | HR 56 | Temp 98.2°F | Ht 65.0 in | Wt 160.0 lb

## 2023-05-03 DIAGNOSIS — R0609 Other forms of dyspnea: Secondary | ICD-10-CM

## 2023-05-03 DIAGNOSIS — E038 Other specified hypothyroidism: Secondary | ICD-10-CM | POA: Diagnosis not present

## 2023-05-03 DIAGNOSIS — I700 Atherosclerosis of aorta: Secondary | ICD-10-CM | POA: Diagnosis not present

## 2023-05-03 DIAGNOSIS — G894 Chronic pain syndrome: Secondary | ICD-10-CM

## 2023-05-03 DIAGNOSIS — Z79899 Other long term (current) drug therapy: Secondary | ICD-10-CM

## 2023-05-03 DIAGNOSIS — Z23 Encounter for immunization: Secondary | ICD-10-CM | POA: Diagnosis not present

## 2023-05-03 DIAGNOSIS — E782 Mixed hyperlipidemia: Secondary | ICD-10-CM

## 2023-05-03 NOTE — Progress Notes (Signed)
Subjective:    Patient ID: Sabrina Adams, female    DOB: 12-06-1945, 77 y.o.   MRN: [id number]  Discussed the use of AI scribe software for clinical note transcription with the patient, who gave verbal consent to proceed.  History of Present Illness   The dry mouth, which has been more pronounced recently, is suspected to be a side effect of the oxybutynin. The patient has been managing this with lozenges and Vaseline in the nose at night, but it remains a significant issue, particularly at night when they cannot sleep without a lozenge. The patient also reports a recent increase in difficulty breathing, which does not worsen with activity and does not affect sleep.  The patient has been on Prolia for osteoporosis but has concerns about the cost after receiving a larger than expected bill. Previously, the patient was on a bisphosphonate for many years but was advised to take a drug holiday due to the risk of brittle bones.  The patient's pain is managed with tramadol, typically three times a day, and hydromorphone, which is rarely used. The patient reports that the tramadol is essential for managing their pain, particularly after activities such as gardening.  The patient is also on cholesterol and thyroid medication, which they take consistently. They report no issues with these medications. The patient's heart has a slight murmur due to calcification on the valve, but it is not severe enough to require surgery. The patient has no swelling in the lower legs and keeps their feet elevated when possible.     We did discuss the importance of safely using her pain medicine she states without it she cannot function she did fill out a pain medicine questionnaire which we will scanned into the system patient would like to continue with her pain medicine.    Review of Systems     Objective:    Physical Exam   CHEST: Lungs clear to auscultation. CARDIOVASCULAR: Heart rhythm regular, presence  of a murmur indicative of mitral regurgitation. EXTREMITIES: No swelling in lower extremities.     She has findings consistent with severe rheumatoid arthritis      Assessment & Plan:  Assessment and Plan    Dry Mouth and Shortness of Breath Likely secondary to Oxybutynin use. Discussed the anticholinergic effects of the medication and the balance between its benefits and side effects. -Continue Oxybutynin at current dose per patient preference. -Consider Pulmonary Function Test and Chest X-ray to evaluate lung volumes and potential restriction due to scoliosis and hiatal hernia.  Osteoporosis Discussed the cost and insurance coverage of Prolia. -Continue Prolia as tolerated and as covered by insurance.  Chronic Pain Managed with Tramadol, taken three times daily. -Continue Tramadol as prescribed.  General Health Maintenance -Consider RSV vaccine in January due to potential decreased efficacy after one year. -Recommended COVID-19 booster due to decreased risk of severe disease and hospitalization. -Repeat labs to monitor cholesterol, thyroid, and kidney function. -Schedule follow-up appointment in March.     The patient was seen in followup for chronic pain. A review over at their current pain status was discussed. Drug registry was checked. Prescriptions were given.  Regular follow-up recommended. Discussion was held regarding the importance of compliance with medication as well as pain medication contract.  Patient was informed that medication may cause drowsiness and should not be combined  with other medications/alcohol or street drugs. If the patient feels medication is causing altered alertness then do not drive or operate dangerous equipment.  Should  be noted that the patient appears to be meeting appropriate use of opioids and response.  Evidenced by improved function and decent pain control without significant side effects and no evidence of overt aberrancy issues.  Upon  discussion with the patient today they understand that opioid therapy is optional and they feel that the pain has been refractory to reasonable conservative measures and is significant and affecting quality of life enough to warrant ongoing therapy and wishes to continue opioids.  Refills were provided.  [facility] medical Board guidelines regarding the pain medicine has been reviewed.  CDC guidelines most updated 2022 has been reviewed by the prescriber.  PDMP is checked on a regular basis yearly urine drug screen and pain management contract Patient is on tramadol so we can send in refills of that she rarely uses hydromorphone

## 2023-05-04 ENCOUNTER — Other Ambulatory Visit: Payer: Self-pay

## 2023-05-04 MED ORDER — LEVOTHYROXINE SODIUM 112 MCG PO TABS: 112.0000 ug | ORAL_TABLET | Freq: Every day | ORAL | 2 refills | Status: DC

## 2023-05-09 DIAGNOSIS — M810 Age-related osteoporosis without current pathological fracture: Secondary | ICD-10-CM | POA: Diagnosis not present

## 2023-05-09 DIAGNOSIS — M1990 Unspecified osteoarthritis, unspecified site: Secondary | ICD-10-CM | POA: Diagnosis not present

## 2023-05-09 DIAGNOSIS — M0579 Rheumatoid arthritis with rheumatoid factor of multiple sites without organ or systems involvement: Secondary | ICD-10-CM | POA: Diagnosis not present

## 2023-05-09 DIAGNOSIS — M2550 Pain in unspecified joint: Secondary | ICD-10-CM | POA: Diagnosis not present

## 2023-05-09 DIAGNOSIS — M25439 Effusion, unspecified wrist: Secondary | ICD-10-CM | POA: Diagnosis not present

## 2023-05-09 DIAGNOSIS — Z7952 Long term (current) use of systemic steroids: Secondary | ICD-10-CM | POA: Diagnosis not present

## 2023-05-09 DIAGNOSIS — M79671 Pain in right foot: Secondary | ICD-10-CM | POA: Diagnosis not present

## 2023-05-09 DIAGNOSIS — M79643 Pain in unspecified hand: Secondary | ICD-10-CM | POA: Diagnosis not present

## 2023-05-09 DIAGNOSIS — M549 Dorsalgia, unspecified: Secondary | ICD-10-CM | POA: Diagnosis not present

## 2023-05-15 DIAGNOSIS — Z79899 Other long term (current) drug therapy: Secondary | ICD-10-CM | POA: Diagnosis not present

## 2023-05-15 DIAGNOSIS — E038 Other specified hypothyroidism: Secondary | ICD-10-CM | POA: Diagnosis not present

## 2023-05-15 DIAGNOSIS — E782 Mixed hyperlipidemia: Secondary | ICD-10-CM | POA: Diagnosis not present

## 2023-05-16 LAB — TSH+FREE T4
Free T4: 1.66 ng/dL (ref 0.82–1.77)
TSH: 3.88 u[IU]/mL (ref 0.450–4.500)

## 2023-05-16 LAB — BASIC METABOLIC PANEL
BUN/Creatinine Ratio: 27 (ref 12–28)
BUN: 27 mg/dL (ref 8–27)
CO2: 25 mmol/L (ref 20–29)
Calcium: 10 mg/dL (ref 8.7–10.3)
Chloride: 99 mmol/L (ref 96–106)
Creatinine, Ser: 0.99 mg/dL (ref 0.57–1.00)
Glucose: 101 mg/dL — ABNORMAL HIGH (ref 70–99)
Potassium: 4.3 mmol/L (ref 3.5–5.2)
Sodium: 140 mmol/L (ref 134–144)
eGFR: 59 mL/min/{1.73_m2} — ABNORMAL LOW (ref >59)

## 2023-05-16 LAB — LIPID PANEL
Chol/HDL Ratio: 2.1 {ratio} (ref 0.0–4.4)
Cholesterol, Total: 172 mg/dL (ref 100–199)
HDL: 83 mg/dL (ref >39)
LDL Chol Calc (NIH): 75 mg/dL (ref 0–99)
Triglycerides: 77 mg/dL (ref 0–149)
VLDL Cholesterol Cal: 14 mg/dL (ref 5–40)

## 2023-06-04 ENCOUNTER — Other Ambulatory Visit: Payer: Self-pay | Admitting: Family Medicine

## 2023-06-11 ENCOUNTER — Other Ambulatory Visit: Payer: Self-pay | Admitting: Family Medicine

## 2023-06-11 ENCOUNTER — Encounter: Payer: Self-pay | Admitting: Family Medicine

## 2023-06-11 DIAGNOSIS — H401131 Primary open-angle glaucoma, bilateral, mild stage: Secondary | ICD-10-CM | POA: Diagnosis not present

## 2023-06-11 DIAGNOSIS — H01001 Unspecified blepharitis right upper eyelid: Secondary | ICD-10-CM | POA: Diagnosis not present

## 2023-06-11 DIAGNOSIS — H16223 Keratoconjunctivitis sicca, not specified as Sjogren's, bilateral: Secondary | ICD-10-CM | POA: Diagnosis not present

## 2023-06-25 DIAGNOSIS — H16221 Keratoconjunctivitis sicca, not specified as Sjogren's, right eye: Secondary | ICD-10-CM | POA: Diagnosis not present

## 2023-07-02 ENCOUNTER — Ambulatory Visit (HOSPITAL_COMMUNITY)
Admission: RE | Admit: 2023-07-02 | Discharge: 2023-07-02 | Disposition: A | Discharge status: Home / Self Care | Payer: Medicare Other | Source: Ambulatory Visit | Attending: Family Medicine | Admitting: Family Medicine

## 2023-07-02 ENCOUNTER — Other Ambulatory Visit: Payer: Self-pay | Admitting: Family Medicine

## 2023-07-02 DIAGNOSIS — Z1231 Encounter for screening mammogram for malignant neoplasm of breast: Secondary | ICD-10-CM | POA: Insufficient documentation

## 2023-07-11 ENCOUNTER — Other Ambulatory Visit: Payer: Self-pay | Admitting: Family Medicine

## 2023-07-11 DIAGNOSIS — I10 Essential (primary) hypertension: Secondary | ICD-10-CM

## 2023-08-14 ENCOUNTER — Ambulatory Visit (HOSPITAL_COMMUNITY)
Admission: RE | Admit: 2023-08-14 | Discharge: 2023-08-14 | Disposition: A | Discharge status: Home / Self Care | Payer: Medicare Other | Source: Ambulatory Visit | Attending: Family Medicine | Admitting: Family Medicine

## 2023-08-14 DIAGNOSIS — R0609 Other forms of dyspnea: Secondary | ICD-10-CM | POA: Diagnosis not present

## 2023-08-14 LAB — PULMONARY FUNCTION TEST
DL/VA % pred: 94 %
DL/VA: 3.95 ml/min/mmHg/L
DLCO unc % pred: 95 %
DLCO unc: 16.85 ml/min/mmHg
FEF 25-75 Post: 1.42 L/s
FEF 25-75 Pre: 2.03 L/s
FEF2575-%Change-Post: -29 %
FEF2575-%Pred-Post: 92 %
FEF2575-%Pred-Pre: 131 %
FEV1-%Change-Post: -7 %
FEV1-%Pred-Post: 101 %
FEV1-%Pred-Pre: 109 %
FEV1-Post: 1.95 L
FEV1-Pre: 2.1 L
FEV1FVC-%Change-Post: 2 %
FEV1FVC-%Pred-Pre: 105 %
FEV6-%Change-Post: -9 %
FEV6-%Pred-Post: 98 %
FEV6-%Pred-Pre: 109 %
FEV6-Post: 2.4 L
FEV6-Pre: 2.66 L
FEV6FVC-%Pred-Post: 105 %
FEV6FVC-%Pred-Pre: 105 %
FVC-%Change-Post: -9 %
FVC-%Pred-Post: 93 %
FVC-%Pred-Pre: 103 %
FVC-Post: 2.4 L
FVC-Pre: 2.66 L
Post FEV1/FVC ratio: 81 %
Post FEV6/FVC ratio: 100 %
Pre FEV1/FVC ratio: 79 %
Pre FEV6/FVC Ratio: 100 %
RV % pred: 29 %
RV: 0.64 L
TLC % pred: 70 %
TLC: 3.33 L

## 2023-08-14 MED ORDER — ALBUTEROL SULFATE (2.5 MG/3ML) 0.083% IN NEBU
2.5000 mg | INHALATION_SOLUTION | Freq: Once | RESPIRATORY_TRACT | Status: AC
  Administered 2023-08-14: 2.5 mg via RESPIRATORY_TRACT

## 2023-08-16 DIAGNOSIS — M069 Rheumatoid arthritis, unspecified: Secondary | ICD-10-CM | POA: Diagnosis not present

## 2023-08-16 DIAGNOSIS — H16223 Keratoconjunctivitis sicca, not specified as Sjogren's, bilateral: Secondary | ICD-10-CM | POA: Diagnosis not present

## 2023-08-16 DIAGNOSIS — H401131 Primary open-angle glaucoma, bilateral, mild stage: Secondary | ICD-10-CM | POA: Diagnosis not present

## 2023-08-16 DIAGNOSIS — Z79899 Other long term (current) drug therapy: Secondary | ICD-10-CM | POA: Diagnosis not present

## 2023-09-20 DIAGNOSIS — H401131 Primary open-angle glaucoma, bilateral, mild stage: Secondary | ICD-10-CM | POA: Diagnosis not present

## 2023-09-20 DIAGNOSIS — H16223 Keratoconjunctivitis sicca, not specified as Sjogren's, bilateral: Secondary | ICD-10-CM | POA: Diagnosis not present

## 2023-10-31 ENCOUNTER — Ambulatory Visit: Payer: Medicare Other | Admitting: Family Medicine

## 2023-11-07 ENCOUNTER — Encounter: Payer: Self-pay | Admitting: Family Medicine

## 2023-11-07 ENCOUNTER — Ambulatory Visit: Admitting: Family Medicine

## 2023-11-07 VITALS — BP 138/72 | HR 65 | Temp 97.3°F | Ht 65.0 in | Wt 158.0 lb

## 2023-11-07 DIAGNOSIS — E038 Other specified hypothyroidism: Secondary | ICD-10-CM

## 2023-11-07 DIAGNOSIS — M0579 Rheumatoid arthritis with rheumatoid factor of multiple sites without organ or systems involvement: Secondary | ICD-10-CM | POA: Diagnosis not present

## 2023-11-07 DIAGNOSIS — M1990 Unspecified osteoarthritis, unspecified site: Secondary | ICD-10-CM | POA: Diagnosis not present

## 2023-11-07 DIAGNOSIS — E782 Mixed hyperlipidemia: Secondary | ICD-10-CM

## 2023-11-07 DIAGNOSIS — M79643 Pain in unspecified hand: Secondary | ICD-10-CM | POA: Diagnosis not present

## 2023-11-07 DIAGNOSIS — M549 Dorsalgia, unspecified: Secondary | ICD-10-CM | POA: Diagnosis not present

## 2023-11-07 DIAGNOSIS — G894 Chronic pain syndrome: Secondary | ICD-10-CM

## 2023-11-07 DIAGNOSIS — M79671 Pain in right foot: Secondary | ICD-10-CM | POA: Diagnosis not present

## 2023-11-07 DIAGNOSIS — M2550 Pain in unspecified joint: Secondary | ICD-10-CM | POA: Diagnosis not present

## 2023-11-07 DIAGNOSIS — M810 Age-related osteoporosis without current pathological fracture: Secondary | ICD-10-CM | POA: Diagnosis not present

## 2023-11-07 DIAGNOSIS — M25439 Effusion, unspecified wrist: Secondary | ICD-10-CM | POA: Diagnosis not present

## 2023-11-07 DIAGNOSIS — Z7952 Long term (current) use of systemic steroids: Secondary | ICD-10-CM | POA: Diagnosis not present

## 2023-11-07 MED ORDER — TRAMADOL HCL 50 MG PO TABS: ORAL_TABLET | ORAL | 4 refills | Status: DC

## 2023-11-07 NOTE — Progress Notes (Signed)
 Subjective:    Patient ID: Sabrina Adams, female    DOB: 03-03-1946, 78 y.o.   MRN: [id number]  HPI  Dry mouth going on for a couple of months Has to carry water and sip at all times to prevent problems breathing   This patient was seen today for chronic pain  The medication list was reviewed and updated.   Location of Pain for which the patient has been treated with regarding narcotics: Patient has severe rheumatoid arthritis of multiple joints  Onset of this pain: Present for years   -Compliance with medication: Good compliance  - Number patient states they take daily: 3 or 4 tablets of tramadol daily very rarely hydromorphone  -Reason for ongoing use of opioids does not get adequate relief with Tylenol  What other measures have been tried outside of opioids Tylenol, NSAIDs  In the ongoing specialists regarding this condition rheumatoid arthritis Dr.  Marland Kitchenwhen was the last dose patient took?  Earlier today  The patient was advised the importance of maintaining medication and not using illegal substances with these.  Here for refills and follow up  The patient was educated that we can provide 3 monthly scripts for their medication, it is their responsibility to follow the instructions.  Side effects or complications from medications: Denies side effects  Patient is aware that pain medications are meant to minimize the severity of the pain to allow their pain levels to improve to allow for better function. They are aware of that pain medications cannot totally remove their pain.  Due for UDT ( at least once per year) (pain management contract is also completed at the time of the UDT): None recently  Scale of 1 to 10 ( 1 is least 10 is most) Your pain level without the medicine: 8 Your pain level with medication 5  Scale 1 to 10 ( 1-helps very little, 10 helps very well) How well does your pain medication reduce your pain so you can function better through out the  day?  7  Quality of the pain: Aching throbbing  Persistence of the pain: Present all the time   Modifying factors: Worse with activity  Discussed the use of AI scribe software for clinical note transcription with the patient, who gave verbal consent to proceed.  History of Present Illness   Sabrina Adams "Sabrina Adams" is a 78 year old female who presents for a follow-up visit. She is accompanied by Rosanne Ashing, her caregiver.  She is scheduled to see her arthritis specialist this afternoon, where she typically undergoes a panel of blood work. She takes tramadol in the morning and at bedtime, sometimes in combination with gabapentin, and rarely uses hydromorphone. She experiences dry mouth, which she attributes to oxybutynin (Ditropan).  She describes her bowel movements as not moving well but denies any blood in her stool. Her breathing is normal during physical activity, and she reports no issues with heart function.  In terms of mobility, she does not wear a brace at home and wears a croc on her right foot due to a shorter right leg. She is cautious with balance, often holding onto furniture for support. She performs a morning routine of leg lifts and bicycling exercises before getting out of bed and mentions the challenge of navigating her sloped backyard to the garden. She acknowledges she 'probably doesn't do as much as she should' in terms of exercise but tries to incorporate some physical activity into her routine.  Review of Systems     Objective:   Physical Exam General-in no acute distress Eyes-no discharge Lungs-respiratory rate normal, CTA CV-no murmurs,RRR Extremities skin warm dry no edema Neuro grossly normal Behavior normal, alert  Severe rheumatoid arthritis noted.  Brace on the right leg noted.      Assessment & Plan:  Assessment and Plan    Chronic Pain Chronic pain managed with tramadol and gabapentin. Hydromorphone rarely used.  Arthritis Chronic  arthritis managed by a specialist. Blood work typically done during visits. - Remind specialist to send a copy of blood work results. - Order cholesterol and thyroid tests for future convenience.  Balance and Mobility Issues Balance and mobility issues due to not wearing a brace and wearing different shoes. Could benefit from structured balance training. - Print balance exercises for her to perform at home two to three times a week.  Dry Mouth Likely caused by oxybutynin. Discussed Mybetriq as an alternative, which may not cause dry mouth but may require insurance pre-approval. - Write down Mybetriq for her to check insurance coverage and cost. - Consider trial of Mybetriq if affordable.  Constipation Bowel movements not regular. No use of stool softeners. No blood in bowel movements.  General Health Maintenance Encouraged setting aside time for balance exercises to enhance life activities. - Encourage setting aside time for balance exercises two to three times a week.  Follow-up Follow-up plans discussed for lab work and potential medication changes. - Order cholesterol and thyroid tests. - Provide lab order papers to her. - Print balance exercise instructions for her.      The patient was seen in followup for chronic pain. A review over at their current pain status was discussed. Drug registry was checked. Prescriptions were given.  Regular follow-up recommended. Discussion was held regarding the importance of compliance with medication as well as pain medication contract.  Patient was informed that medication may cause drowsiness and should not be combined  with other medications/alcohol or street drugs. If the patient feels medication is causing altered alertness then do not drive or operate dangerous equipment.  Should be noted that the patient appears to be meeting appropriate use of opioids and response.  Evidenced by improved function and decent pain control without  significant side effects and no evidence of overt aberrancy issues.  Upon discussion with the patient today they understand that opioid therapy is optional and they feel that the pain has been refractory to reasonable conservative measures and is significant and affecting quality of life enough to warrant ongoing therapy and wishes to continue opioids.  Refills were provided.  [facility] medical Board guidelines regarding the pain medicine has been reviewed.  CDC guidelines most updated 2022 has been reviewed by the prescriber.  PDMP is checked on a regular basis yearly urine drug screen and pain management contract-tramadol prescription updated  Treatment plan for this patient includes #1-gentle stretching exercises as shown daily basis 2.  Mild strength exercises 3 times per week #3 continue pain medications #4 notify us if any digression  Hyperlipidemia-continue current measures.  Check lab work before next visit Hypothyroidism continue current measures check lab work before next visit

## 2023-11-09 ENCOUNTER — Telehealth: Payer: Self-pay | Admitting: *Deleted

## 2023-11-09 NOTE — Telephone Encounter (Unsigned)
 Copied from CRM [id number]. Topic: Clinical - Prescription Issue >> Nov 09, 2023  4:09 PM Pierre Bali B wrote: Reason for CRM: debbie from [employer] called in because they don't cover the medication nirabegron 25mg  which is what Dr.Luking prescribed to the patient . She recommend some alternatives that can are approved by the insurance .   solifenacin 5mg  tablet  trospium cl tablet 20mg    tolterodine 1mg  tablet

## 2023-11-09 NOTE — Telephone Encounter (Signed)
 Copied from CRM [id number]. Topic: Clinical - Prescription Issue >> Nov 09, 2023  4:09 PM Pierre Bali B wrote: Reason for CRM: debbie from [employer] called in because they don't cover the medication nirabegron 25mg  which is what Dr.Luking prescribed to the patient . She recommend some alternatives that can are approved by the insurance .   solifenacin 5mg  tablet  trospium cl tablet 20mg    tolterodine 1mg  tablet

## 2023-11-11 NOTE — Telephone Encounter (Signed)
 Hi Sabrina Adams This patient was having severe dry mouth with oxybutynin We tried to change the medicine but it is not covered by her insurance These other 3 medicines that are listed Any thoughts regarding if 1 of these do not have dry mouth associated with it? Or at least which medication would have the least likelihood of causing dry mouth of these Thank you-Dr. Lorin Picket

## 2023-11-14 ENCOUNTER — Encounter: Payer: Self-pay | Admitting: Family Medicine

## 2023-11-15 DIAGNOSIS — E782 Mixed hyperlipidemia: Secondary | ICD-10-CM | POA: Diagnosis not present

## 2023-11-15 DIAGNOSIS — E038 Other specified hypothyroidism: Secondary | ICD-10-CM | POA: Diagnosis not present

## 2023-11-16 LAB — LIPID PANEL
Chol/HDL Ratio: 2.2 ratio (ref 0.0–4.4)
Cholesterol, Total: 176 mg/dL (ref 100–199)
HDL: 81 mg/dL (ref >39)
LDL Chol Calc (NIH): 65 mg/dL (ref 0–99)
Triglycerides: 189 mg/dL — ABNORMAL HIGH (ref 0–149)
VLDL Cholesterol Cal: 30 mg/dL (ref 5–40)

## 2023-11-16 LAB — TSH: TSH: 8.69 u[IU]/mL — ABNORMAL HIGH (ref 0.450–4.500)

## 2023-11-17 ENCOUNTER — Encounter: Payer: Self-pay | Admitting: Family Medicine

## 2023-11-19 ENCOUNTER — Other Ambulatory Visit: Payer: Self-pay

## 2023-11-19 DIAGNOSIS — Z79899 Other long term (current) drug therapy: Secondary | ICD-10-CM

## 2023-11-19 DIAGNOSIS — I10 Essential (primary) hypertension: Secondary | ICD-10-CM

## 2023-11-19 DIAGNOSIS — E038 Other specified hypothyroidism: Secondary | ICD-10-CM

## 2023-11-19 DIAGNOSIS — E782 Mixed hyperlipidemia: Secondary | ICD-10-CM

## 2023-11-19 MED ORDER — LEVOTHYROXINE SODIUM 112 MCG PO TABS: ORAL_TABLET | ORAL | 2 refills | Status: DC

## 2023-12-07 ENCOUNTER — Other Ambulatory Visit: Payer: Self-pay | Admitting: Family Medicine

## 2023-12-07 ENCOUNTER — Other Ambulatory Visit: Payer: Self-pay

## 2023-12-07 MED ORDER — ONDANSETRON HCL 4 MG PO TABS: ORAL_TABLET | ORAL | 2 refills | Status: AC

## 2023-12-27 DIAGNOSIS — H16223 Keratoconjunctivitis sicca, not specified as Sjogren's, bilateral: Secondary | ICD-10-CM | POA: Diagnosis not present

## 2023-12-28 ENCOUNTER — Ambulatory Visit (INDEPENDENT_AMBULATORY_CARE_PROVIDER_SITE_OTHER): Payer: Medicare Other

## 2023-12-28 VITALS — BP 112/64 | Ht 65.0 in | Wt 149.0 lb

## 2023-12-28 DIAGNOSIS — Z0000 Encounter for general adult medical examination without abnormal findings: Secondary | ICD-10-CM | POA: Diagnosis not present

## 2023-12-28 NOTE — Progress Notes (Signed)
 Subjective:   Sabrina Adams is a 78 y.o. who presents for a Medicare Wellness preventive visit.  As a reminder, Annual Wellness Visits don't include a physical exam, and some assessments may be limited, especially if this visit is performed virtually. We may recommend an in-person follow-up visit with your provider if needed.  Visit Complete: In person  Persons Participating in Visit: Patient.  AWV Questionnaire: No: Patient Medicare AWV questionnaire was not completed prior to this visit.  Cardiac Risk Factors include: advanced age (>32men, >47 women);hypertension     Objective:     Today's Vitals   12/28/23 1357  BP: 112/64  Weight: 149 lb (67.6 kg)  Height: 5\' 5"  (1.651 m)   Body mass index is 24.79 kg/m.     12/28/2023    3:00 PM 08/04/2021   10:26 AM 06/21/2021   11:57 AM 12/20/2017   10:45 PM 12/20/2017    8:25 PM 09/21/2015    4:45 PM 09/17/2015    1:06 PM  Advanced Directives  Does Patient Have a Medical Advance Directive? Yes Yes Yes Yes No Yes Yes  Type of Estate agent of [place];Living will Living will;Healthcare Power of [employer] Power of [place];Living will Living will;Healthcare Power of [employer] Power of [place];Living will Healthcare Power of [place];Living will  Does patient want to make changes to medical advance directive? No - Patient declined No - Patient declined  No - Patient declined  No - Patient declined No - Patient declined  Copy of Healthcare Power of Attorney in Chart? Yes - validated most recent copy scanned in chart (See row information) No - copy requested    No - copy requested No - copy requested  Would patient like information on creating a medical advance directive?  No - Patient declined  No - Patient declined No - Patient declined      Current Medications (verified) Outpatient Encounter Medications as of 12/28/2023  Medication Sig   acetaminophen  (TYLENOL ) 650 MG CR tablet Take  1,300 mg by mouth 2 (two) times daily.   amLODipine  (NORVASC ) 5 MG tablet TAKE 1 TABLET BY MOUTH DAILY   b complex vitamins tablet Take 1 tablet by mouth daily.   Cholecalciferol (VITAMIN D3) 125 MCG (5000 UT) CAPS Take 5,000 Units by mouth once a week.   docusate sodium  (COLACE) 100 MG capsule Take 200 mg by mouth 2 (two) times daily.   gabapentin  (NEURONTIN ) 300 MG capsule TAKE 1 CAPSULE BY MOUTH TWICE  DAILY CAN TAKE THIRD DOSE IF  NEEDED   hydrocortisone 1 % ointment Apply 1 application  topically 2 (two) times daily as needed for itching.   HYDROmorphone  (DILAUDID ) 2 MG tablet One po three times daily as needed   hydroxychloroquine  (PLAQUENIL ) 200 MG tablet Take 1 tablet (200 mg total) by mouth 2 (two) times daily.   ibandronate (BONIVA) 150 MG tablet Take 150 mg by mouth every 30 (thirty) days.   levothyroxine  (SYNTHROID ) 112 MCG tablet Take one and a half tab by mouth on Sundays and Thursdays. Take one tab by mouth on all the other days   Lysine 500 MG TABS Take 500 mg by mouth daily as needed.   Multiple Vitamin (MULTIVITAMIN WITH MINERALS) TABS Take 1 tablet by mouth daily.   olmesartan  (BENICAR ) 5 MG tablet TAKE 1 TABLET BY MOUTH DAILY   ondansetron  (ZOFRAN ) 4 MG tablet TAKE 1 TABLET BY MOUTH 3 TIMES  DAILY AS NEEDED FOR NAUSEA   oxybutynin  (DITROPAN -XL)  10 MG 24 hr tablet TAKE 1 TABLET BY MOUTH AT  BEDTIME   pantoprazole  (PROTONIX ) 40 MG tablet TAKE 1 TABLET BY MOUTH DAILY  BEFORE BREAKFAST   polycarbophil (FIBERCON) 625 MG tablet Take 625 mg by mouth in the morning and at bedtime.   polyethylene glycol (MIRALAX  / GLYCOLAX ) packet Take 17 g by mouth daily as needed for mild constipation. Constipation   predniSONE  (DELTASONE ) 2.5 MG tablet Take 3 tablets (7.5 mg total) by mouth daily with breakfast.   Probiotic Product (PROBIOTIC DAILY PO) Take 1 capsule by mouth daily.   promethazine  (PHENERGAN ) 25 MG tablet TAKE ONE-HALF TABLET BY MOUTH AT BEDTIME AS NEEDED   Propylene Glycol  (SYSTANE BALANCE OP) Place 1 drop into both eyes 2 (two) times daily.   rosuvastatin  (CRESTOR ) 10 MG tablet TAKE 1 TABLET BY MOUTH ON  MONDAY, WEDNESDAY, AND FRIDAY   terbinafine (LAMISIL) 1 % cream Apply 1 application topically 2 (two) times daily as needed (antifungal).   timolol  (TIMOPTIC ) 0.5 % ophthalmic solution Place 1 drop into both eyes daily.   traMADol  (ULTRAM ) 50 MG tablet TAKE 1 TABLET BY MOUTH 4 TIMES A DAY AS NEEDED FOR PAIN   No facility-administered encounter medications on file as of 12/28/2023.    Allergies (verified) Betadine [povidone iodine], Cinnamon, Codeine, Other, Actonel [risedronate sodium], Adhesive [tape], Lidocaine, and Methotrexate derivatives   History: Past Medical History:  Diagnosis Date   Anemia    Arthritis    rheumatoid   Complication of anesthesia    nausea ,  psycotics episodes   GERD (gastroesophageal reflux disease)    Hypertension    Hypothyroidism    Neuromuscular disorder (HCC)    neuropathy  legs   PONV (postoperative nausea and vomiting)    RA (rheumatoid arthritis) (HCC)    Sleep apnea    Stroke (HCC)    mild   TMJ (dislocation of temporomandibular joint)    Past Surgical History:  Procedure Laterality Date   ABDOMINAL HYSTERECTOMY  2005   ABDOMINAL SURGERY     2006 colectomy for diverticulitis   ANKLE SURGERY     BIOPSY  08/10/2021   Procedure: BIOPSY;  Surgeon: Ruby Corporal, MD;  [facility];  Service: Endoscopy;;   COLON SURGERY     colostomy then revision   COLONOSCOPY  04/04/2012   Procedure: COLONOSCOPY;  Surgeon: Ruby Corporal, MD;  [facility];  Service: Endoscopy;  Laterality: N/A;  410-Ok per [place]   COLONOSCOPY N/A 03/23/2016   Procedure: COLONOSCOPY;  Surgeon: Ruby Corporal, MD;  [facility];  Service: Endoscopy;  Laterality: N/A;  2:50   COLONOSCOPY WITH PROPOFOL  N/A 08/10/2021   Procedure: COLONOSCOPY WITH PROPOFOL ;  Surgeon: Ruby Corporal, MD;  [facility];  Service: Endoscopy;  Laterality: N/A;  8:05   ESOPHAGOGASTRODUODENOSCOPY  04/04/2012   Procedure: ESOPHAGOGASTRODUODENOSCOPY (EGD);  Surgeon: Ruby Corporal, MD;  [facility];  Service: Endoscopy;  Laterality: N/A;  possible   HAND ARTHROPLASTY     LUMBAR SPINE SURGERY  2005   POLYPECTOMY  08/10/2021   Procedure: POLYPECTOMY;  Surgeon: Ruby Corporal, MD;  [facility];  Service: Endoscopy;;   TONSILLECTOMY     Family History  Problem Relation Age of Onset   Hypertension Mother    Hypertension Father    Heart attack Father    Heart disease Father    Social History   Socioeconomic History   Marital  status: Married    Spouse name: Josiah Nigh   Number of children: 1   Years of education: Not on file   Highest education level: Not on file  Occupational History   Occupation: Retired  Tobacco Use   Smoking status: Never   Smokeless tobacco: Never  Substance and Sexual Activity   Alcohol  use: Yes    Comment: rarely   Drug use: No   Sexual activity: Not on file  Other Topics Concern   Not on file  Social History Narrative   Married x 51 years in 2022.   1 son lives in [place], [place]   1 grand daughter.   Social Drivers of Corporate investment banker Strain: Low Risk  (12/28/2023)   Overall Financial Resource Strain (CARDIA)    Difficulty of Paying Living Expenses: Not hard at all  Food Insecurity: No Food Insecurity (12/28/2023)   Hunger Vital Sign    Worried About Running Out of Food in the Last Year: Never true    Ran Out of Food in the Last Year: Never true  Transportation Needs: No Transportation Needs (12/28/2023)   PRAPARE - Administrator, Civil Service (Medical): No    Lack of Transportation (Non-Medical): No  Physical Activity: Insufficiently Active (12/28/2023)   Exercise Vital Sign    Days of Exercise per Week: 3 days    Minutes of Exercise per Session: 30 min  Stress: No Stress Concern Present (12/28/2023)   [employer] of  Occupational Health - Occupational Stress Questionnaire    Feeling of Stress : Only a little  Social Connections: Socially Integrated (12/28/2023)   Social Connection and Isolation Panel [NHANES]    Frequency of Communication with Friends and Family: More than three times a week    Frequency of Social Gatherings with Friends and Family: More than three times a week    Attends Religious Services: More than 4 times per year    Active Member of [employer] or Organizations: Yes    Attends Engineer, structural: More than 4 times per year    Marital Status: Married    Tobacco Counseling Counseling given: Not Answered    Clinical Intake:  Pre-visit preparation completed: Yes  Pain : No/denies pain     Diabetes: No  Lab Results  Component Value Date   HGBA1C 5.9 (H) 11/16/2015     How often do you need to have someone help you when you read instructions, pamphlets, or other written materials from your doctor or pharmacy?: 1 - Never  Interpreter Needed?: No  Information entered by :: Seabron Cypress LPN   Activities of Daily Living     12/28/2023    3:00 PM  In your present state of health, do you have any difficulty performing the following activities:  Hearing? 0  Vision? 0  Difficulty concentrating or making decisions? 0  Walking or climbing stairs? 0  Dressing or bathing? 0  Doing errands, shopping? 0  Preparing Food and eating ? N  Using the Toilet? N  In the past six months, have you accidently leaked urine? N  Do you have problems with loss of bowel control? N  Managing your Medications? N  Managing your Finances? N  Housekeeping or managing your Housekeeping? N    Patient Care Team: Bennet Brasil, MD as PCP - General (Family Medicine)  Indicate any recent Medical Services you may have received from other than [facility] providers in the past year (date may be approximate).  Assessment:    This is a routine wellness examination for  [place].  Hearing/Vision screen Hearing Screening - Comments:: Denies hearing difficulties   Vision Screening - Comments:: Wears rx glasses - up to date with routine eye exams with Dr. Adora Hoover    Goals Addressed             This Visit's Progress    Have 3 meals a day   On track    Maintain current health       Depression Screen     12/28/2023    2:58 PM 05/03/2023    2:24 PM 12/01/2022    1:35 PM 08/01/2022   10:00 AM 06/21/2021   11:53 AM 04/18/2021    2:13 PM 12/14/2020    1:19 PM  PHQ 2/9 Scores  PHQ - 2 Score 0 0 0 0 0 0 0  PHQ- 9 Score  0 4 0       Fall Risk     12/28/2023    2:59 PM 05/03/2023    2:24 PM 12/01/2022    1:35 PM 08/01/2022   10:01 AM 09/13/2021    1:51 PM  Fall Risk   Falls in the past year? 0 0 0 0 0  Number falls in past yr: 0  0 0   Injury with Fall? 0  0 0   Risk for fall due to : Impaired mobility   No Fall Risks Impaired mobility  Follow up Falls prevention discussed;Education provided;Falls evaluation completed   Falls evaluation completed Falls evaluation completed    MEDICARE RISK AT HOME:  Medicare Risk at Home Any stairs in or around the home?: No If so, are there any without handrails?: No Home free of loose throw rugs in walkways, pet beds, electrical cords, etc?: Yes Adequate lighting in your home to reduce risk of falls?: Yes Life alert?: No Use of a cane, walker or w/c?: No Grab bars in the bathroom?: Yes Shower chair or bench in shower?: No Elevated toilet seat or a handicapped toilet?: Yes  TIMED UP AND GO:  Was the test performed?  Yes  Length of time to ambulate 10 feet: 10 sec Gait slow and steady with assistive device  Cognitive Function: 6CIT completed        12/28/2023    3:00 PM 06/21/2021   12:02 PM  6CIT Screen  What Year? 0 points 0 points  What month? 0 points 0 points  What time? 0 points 0 points  Count back from 20 0 points 0 points  Months in reverse 0 points 2 points  Repeat phrase 0 points 0  points  Total Score 0 points 2 points    Immunizations Immunization History  Administered Date(s) Administered   Fluad Quad(high Dose 65+) 05/17/2020, 05/04/2021, 05/24/2022   Fluad Trivalent(High Dose 65+) 05/03/2023   Influenza, Seasonal, Injecte, Preservative Fre 06/05/2012   Influenza,inj,Quad PF,6+ Mos 04/22/2013, 05/28/2014, 05/21/2015, 05/15/2016, 05/16/2017, 07/09/2018   Influenza-Unspecified 05/08/2019   Moderna Covid-19 Fall Seasonal Vaccine 81yrs & older 06/09/2022   Moderna Sars-Covid-2 Vaccination 09/18/2019, 10/17/2019, 07/19/2020   Pneumococcal Conjugate-13 02/16/2014   Pneumococcal Polysaccharide-23 01/04/2010, 11/15/2015   RSV,unspecified 08/22/2022   Tdap 06/16/2020   Unspecified SARS-COV-2 Vaccination 05/25/2021, 05/10/2023   Zoster Recombinant(Shingrix) 08/28/2018, 02/20/2019    Screening Tests Health Maintenance  Topic Date Due   COVID-19 Vaccine (7 - Moderna risk 2024-25 season) 11/08/2023   INFLUENZA VACCINE  03/07/2024   Medicare Annual Wellness (AWV)  12/27/2024   DTaP/Tdap/Td (2 -  Td or Tdap) 06/16/2030   Pneumonia Vaccine 42+ Years old  Completed   DEXA SCAN  Completed   Hepatitis C Screening  Completed   Zoster Vaccines- Shingrix  Completed   HPV VACCINES  Aged Out   Meningococcal B Vaccine  Aged Out   Colonoscopy  Discontinued    Health Maintenance  Health Maintenance Due  Topic Date Due   COVID-19 Vaccine (7 - Moderna risk 2024-25 season) 11/08/2023    Additional Screening:  Vision Screening: Recommended annual ophthalmology exams for early detection of glaucoma and other disorders of the eye.  Dental Screening: Recommended annual dental exams for proper oral hygiene  Community Resource Referral / Chronic Care Management: CRR required this visit?  No   CCM required this visit?  No   Plan:    I have personally reviewed and noted the following in the patient's chart:   Medical and social history Use of alcohol , tobacco or  illicit drugs  Current medications and supplements including opioid prescriptions. Patient is not currently taking opioid prescriptions. Functional ability and status Nutritional status Physical activity Advanced directives List of other physicians Hospitalizations, surgeries, and ER visits in previous 12 months Vitals Screenings to include cognitive, depression, and falls Referrals and appointments  In addition, I have reviewed and discussed with patient certain preventive protocols, quality metrics, and best practice recommendations. A written personalized care plan for preventive services as well as general preventive health recommendations were provided to patient.   Seabron Cypress [place], [place]   1/61/0960   After Visit Summary: (In Person-Printed) AVS printed and given to the patient  Notes: Nothing significant to report at this time.

## 2023-12-28 NOTE — Patient Instructions (Signed)
 Ms. Knape , Thank you for taking time out of your busy schedule to complete your Annual Wellness Visit with me. I enjoyed our conversation and look forward to speaking with you again next year. I, as well as your care team,  appreciate your ongoing commitment to your health goals. Please review the following plan we discussed and let me know if I can assist you in the future. Your Game plan/ To Do List    Follow up Visits: Next Medicare AWV with our clinical staff: In 1 year    Have you seen your provider in the last 6 months (3 months if uncontrolled diabetes)? Yes Next Office Visit with your provider: 05/08/24 @ 2:50  Clinician Recommendations:  Aim for 30 minutes of exercise or brisk walking, 6-8 glasses of water , and 5 servings of fruits and vegetables each day.       This is a list of the screening recommended for you and due dates:  Health Maintenance  Topic Date Due   Medicare Annual Wellness Visit  06/21/2022   COVID-19 Vaccine (7 - Moderna risk 2024-25 season) 11/08/2023   Flu Shot  03/07/2024   DTaP/Tdap/Td vaccine (2 - Td or Tdap) 06/16/2030   Pneumonia Vaccine  Completed   DEXA scan (bone density measurement)  Completed   Hepatitis C Screening  Completed   Zoster (Shingles) Vaccine  Completed   HPV Vaccine  Aged Out   Meningitis B Vaccine  Aged Out   Colon Cancer Screening  Discontinued    Advanced directives: (In Chart) A copy of your advanced directives are scanned into your chart should your provider ever need it.  Advance Care Planning is important because it:  [x]  Makes sure you receive the medical care that is consistent with your values, goals, and preferences  [x]  It provides guidance to your family and loved ones and reduces their decisional burden about whether or not they are making the right decisions based on your wishes.  Follow the link provided in your after visit summary or read over the paperwork we have mailed to you to help you started getting  your Advance Directives in place. If you need assistance in completing these, please reach out to us  so that we can help you!  See attachments for Preventive Care and Fall Prevention Tips.

## 2024-01-04 ENCOUNTER — Other Ambulatory Visit: Payer: Self-pay | Admitting: Family Medicine

## 2024-03-17 ENCOUNTER — Other Ambulatory Visit: Payer: Self-pay | Admitting: Family Medicine

## 2024-03-20 DIAGNOSIS — H16223 Keratoconjunctivitis sicca, not specified as Sjogren's, bilateral: Secondary | ICD-10-CM | POA: Diagnosis not present

## 2024-03-22 ENCOUNTER — Other Ambulatory Visit: Payer: Self-pay | Admitting: Family Medicine

## 2024-03-24 ENCOUNTER — Other Ambulatory Visit: Payer: Self-pay

## 2024-03-24 MED ORDER — PANTOPRAZOLE SODIUM 40 MG PO TBEC: 40.0000 mg | DELAYED_RELEASE_TABLET | Freq: Every day | ORAL | 2 refills | Status: DC

## 2024-03-26 DIAGNOSIS — H401131 Primary open-angle glaucoma, bilateral, mild stage: Secondary | ICD-10-CM | POA: Diagnosis not present

## 2024-03-26 DIAGNOSIS — H5213 Myopia, bilateral: Secondary | ICD-10-CM | POA: Diagnosis not present

## 2024-03-26 LAB — HM DIABETES EYE EXAM

## 2024-04-18 ENCOUNTER — Other Ambulatory Visit: Payer: Self-pay | Admitting: Family Medicine

## 2024-04-30 ENCOUNTER — Ambulatory Visit (INDEPENDENT_AMBULATORY_CARE_PROVIDER_SITE_OTHER)

## 2024-04-30 DIAGNOSIS — Z23 Encounter for immunization: Secondary | ICD-10-CM | POA: Diagnosis not present

## 2024-04-30 NOTE — Patient Instructions (Signed)
Influenza (Flu) Vaccine (Inactivated or Recombinant): What You Need to Know Many vaccine information statements are available in Spanish and other languages. See PromoAge.com.br. 1. Why get vaccinated? Influenza vaccine can prevent influenza (flu). Flu is a contagious disease that spreads around the [place] every year, usually between October and May. Anyone can get the flu, but it is more dangerous for some people. Infants and young children, people 58 years and older, pregnant people, and people with certain health conditions or a weakened immune system are at greatest risk of flu complications. Pneumonia, bronchitis, sinus infections, and ear infections are examples of flu-related complications. If you have a medical condition, such as heart disease, cancer, or diabetes, flu can make it worse. Flu can cause fever and chills, sore throat, muscle aches, fatigue, cough, headache, and runny or stuffy nose. Some people may have vomiting and diarrhea, though this is more common in children than adults. In an average year, thousands of people in the [place] die from flu, and many more are hospitalized. Flu vaccine prevents millions of illnesses and flu-related visits to the doctor each year. 2. Influenza vaccines CDC recommends everyone 6 months and older get vaccinated every flu season. Children 6 months through 51 years of age may need 2 doses during a single flu season. Everyone else needs only 1 dose each flu season. It takes about 2 weeks for protection to develop after vaccination. There are many flu viruses, and they are always changing. Each year a new flu vaccine is made to protect against the influenza viruses believed to be likely to cause disease in the upcoming flu season. Even when the vaccine doesn't exactly match these viruses, it may still provide some protection. Influenza vaccine does not cause flu. Influenza vaccine may be given at the same time as other vaccines. 3.  Talk with your health care provider Tell your vaccination provider if the person getting the vaccine: Has had an allergic reaction after a previous dose of influenza vaccine, or has any severe, life-threatening allergies Has ever had Guillain-Barr Syndrome (also called "GBS") In some cases, your health care provider may decide to postpone influenza vaccination until a future visit. Influenza vaccine can be administered at any time during pregnancy. People who are or will be pregnant during influenza season should receive inactivated influenza vaccine. People with minor illnesses, such as a cold, may be vaccinated. People who are moderately or severely ill should usually wait until they recover before getting influenza vaccine. Your health care provider can give you more information. 4. Risks of a vaccine reaction Soreness, redness, and swelling where the shot is given, fever, muscle aches, and headache can happen after influenza vaccination. There may be a very small increased risk of Guillain-Barr Syndrome (GBS) after inactivated influenza vaccine (the flu shot). Young children who get the flu shot along with pneumococcal vaccine (PCV13) and/or DTaP vaccine at the same time might be slightly more likely to have a seizure caused by fever. Tell your health care provider if a child who is getting flu vaccine has ever had a seizure. People sometimes faint after medical procedures, including vaccination. Tell your provider if you feel dizzy or have vision changes or ringing in the ears. As with any medicine, there is a very remote chance of a vaccine causing a severe allergic reaction, other serious injury, or death. 5. What if there is a serious problem? An allergic reaction could occur after the vaccinated person leaves the clinic. If you see signs of  a severe allergic reaction (hives, swelling of the face and throat, difficulty breathing, a fast heartbeat, dizziness, or weakness), call 9-1-1 and get  the person to the nearest hospital. For other signs that concern you, call your health care provider. Adverse reactions should be reported to the Vaccine Adverse Event Reporting System (VAERS). Your health care provider will usually file this report, or you can do it yourself. Visit the VAERS website at www.vaers.LAgents.no or call (774)365-3656. VAERS is only for reporting reactions, and VAERS staff members do not give medical advice. 6. The National Vaccine Injury Compensation Program The [employer] Vaccine Injury Compensation Program (VICP) is a federal program that was created to compensate people who may have been injured by certain vaccines. Claims regarding alleged injury or death due to vaccination have a time limit for filing, which may be as short as two years. Visit the VICP website at SpiritualWord.at or call 3187036210 to learn about the program and about filing a claim. 7. How can I learn more? Ask your health care provider. Call your local or state health department. Visit the website of the Food and Drug Administration (FDA) for vaccine package inserts and additional information at FinderList.no. Contact the Centers for Disease Control and Prevention (CDC): Call 787-694-5930 (1-800-CDC-INFO) or Visit CDC's website at BiotechRoom.com.cy. Source: CDC Vaccine Information Statement Inactivated Influenza Vaccine (03/12/2020) This same material is available at FootballExhibition.com.br for no charge. This information is not intended to replace advice given to you by your health care provider. Make sure you discuss any questions you have with your health care provider. Document Revised: 11/08/2022 Document Reviewed: 08/14/2022 Elsevier Patient Education  2024 [employer].

## 2024-05-04 ENCOUNTER — Encounter: Payer: Self-pay | Admitting: Family Medicine

## 2024-05-06 DIAGNOSIS — E038 Other specified hypothyroidism: Secondary | ICD-10-CM | POA: Diagnosis not present

## 2024-05-06 DIAGNOSIS — Z79899 Other long term (current) drug therapy: Secondary | ICD-10-CM | POA: Diagnosis not present

## 2024-05-06 DIAGNOSIS — E782 Mixed hyperlipidemia: Secondary | ICD-10-CM | POA: Diagnosis not present

## 2024-05-06 DIAGNOSIS — I10 Essential (primary) hypertension: Secondary | ICD-10-CM | POA: Diagnosis not present

## 2024-05-07 LAB — BASIC METABOLIC PANEL WITH GFR
BUN/Creatinine Ratio: 21 (ref 12–28)
BUN: 20 mg/dL (ref 8–27)
CO2: 25 mmol/L (ref 20–29)
Calcium: 9.5 mg/dL (ref 8.7–10.3)
Chloride: 102 mmol/L (ref 96–106)
Creatinine, Ser: 0.94 mg/dL (ref 0.57–1.00)
Glucose: 75 mg/dL (ref 70–99)
Potassium: 4.4 mmol/L (ref 3.5–5.2)
Sodium: 141 mmol/L (ref 134–144)
eGFR: 63 mL/min/1.73 (ref >59)

## 2024-05-07 LAB — HEPATIC FUNCTION PANEL
ALT: 20 IU/L (ref 0–32)
AST: 37 IU/L (ref 0–40)
Albumin: 4.3 g/dL (ref 3.8–4.8)
Alkaline Phosphatase: 68 IU/L (ref 49–135)
Bilirubin Total: 0.3 mg/dL (ref 0.0–1.2)
Bilirubin, Direct: 0.12 mg/dL (ref 0.00–0.40)
Total Protein: 6.4 g/dL (ref 6.0–8.5)

## 2024-05-07 LAB — LIPID PANEL
Chol/HDL Ratio: 2 ratio (ref 0.0–4.4)
Cholesterol, Total: 159 mg/dL (ref 100–199)
HDL: 80 mg/dL (ref >39)
LDL Chol Calc (NIH): 55 mg/dL (ref 0–99)
Triglycerides: 147 mg/dL (ref 0–149)
VLDL Cholesterol Cal: 24 mg/dL (ref 5–40)

## 2024-05-07 LAB — TSH+FREE T4
Free T4: 1.88 ng/dL — ABNORMAL HIGH (ref 0.82–1.77)
TSH: 0.793 u[IU]/mL (ref 0.450–4.500)

## 2024-05-08 ENCOUNTER — Ambulatory Visit: Admitting: Family Medicine

## 2024-05-08 VITALS — BP 117/67 | HR 65 | Wt 141.1 lb

## 2024-05-08 DIAGNOSIS — M0579 Rheumatoid arthritis with rheumatoid factor of multiple sites without organ or systems involvement: Secondary | ICD-10-CM

## 2024-05-08 DIAGNOSIS — E782 Mixed hyperlipidemia: Secondary | ICD-10-CM | POA: Diagnosis not present

## 2024-05-08 DIAGNOSIS — I10 Essential (primary) hypertension: Secondary | ICD-10-CM

## 2024-05-08 DIAGNOSIS — G894 Chronic pain syndrome: Secondary | ICD-10-CM

## 2024-05-08 DIAGNOSIS — L299 Pruritus, unspecified: Secondary | ICD-10-CM | POA: Diagnosis not present

## 2024-05-08 DIAGNOSIS — G8929 Other chronic pain: Secondary | ICD-10-CM

## 2024-05-08 DIAGNOSIS — M25571 Pain in right ankle and joints of right foot: Secondary | ICD-10-CM

## 2024-05-08 MED ORDER — KETOCONAZOLE 2 % EX SHAM: 1.0000 | MEDICATED_SHAMPOO | CUTANEOUS | 1 refills | Status: DC

## 2024-05-08 NOTE — Progress Notes (Unsigned)
   Subjective:    Patient ID: Sabrina Adams, female    DOB: Mar 28, 1946, 78 y.o.   MRN: [id number]  HPI Patient with significant arthritic pains and discomforts Takes tramadol  denies abusing it Occasional hydromorphone   She also has need for a new brace for her leg stating that it no longer works properly for her He has severe pain in the right ankle from the help of her ankle has been fused and how she walks on the inner aspect  She does have some reflux related issues but does prop herself up to help with that  She complains of itchy scalp which affects her on a regular basis  She also relates she did her blood work recently thyroid  function slightly off TSH was in the normal range but T4 was too high  She does use the oxybutynin  and she states it does help her with keeping her urinary symptoms under decent control  She does take her cholesterol medicine regular basis tolerates it well  She has put effort into eating healthier and drinking water  she has lost weight down to 141     Review of Systems     Objective:   Physical Exam General-in no acute distress Eyes-no discharge Lungs-respiratory rate normal, CTA CV-no murmurs,RRR Extremities skin warm dry no edema Neuro grossly normal Behavior normal, alert Severe arthritis in the hands right ankle Left knee has tenderness medial aspect ligament seems stable  No psoriasis seen in the scalp no dermatitis seen     Assessment & Plan:  1. Chronic pain of right ankle (Primary) Referral to [facility] for prosthesis brace Her brace is no longer working well She needs a new one  2. Mixed hyperlipidemia Continue cholesterol medicine healthy diet check labs 6 months  3. Essential hypertension, benign Blood pressure good control continue current medication check labs 6 months  4. Rheumatoid arthritis with rheumatoid factor of multiple sites without organ or systems involvement (HCC) Follow through the  rheumatologist regular basis Patient has no desire to go on biologic because of previous severe results of biologic agents including spinal infection  5. Chronic pain syndrome On tramadol  Doing well with this Recommend continuing current dosing Sparing use of hydromorphone   6. Itchy scalp Ketoconazole  as directed  Because of her severe rheumatoid arthritis she cannot walk any long distances.  She needs a wheelchair for traveling purposes.  It is a necessary item for her to complete ADLs.  She prefers a transfer wheelchair with 4 small tires because that is easier to utilize rather than a standard wheelchair

## 2024-05-10 ENCOUNTER — Other Ambulatory Visit: Payer: Self-pay | Admitting: Family Medicine

## 2024-05-11 ENCOUNTER — Telehealth: Payer: Self-pay | Admitting: Family Medicine

## 2024-05-11 ENCOUNTER — Ambulatory Visit: Payer: Self-pay | Admitting: Family Medicine

## 2024-05-11 DIAGNOSIS — I10 Essential (primary) hypertension: Secondary | ICD-10-CM

## 2024-05-11 DIAGNOSIS — E038 Other specified hypothyroidism: Secondary | ICD-10-CM

## 2024-05-11 DIAGNOSIS — E782 Mixed hyperlipidemia: Secondary | ICD-10-CM

## 2024-05-11 NOTE — Telephone Encounter (Signed)
 I believe that this was not a previous communication through the nurses note but I am not certain that it was seen  Patient requested the following  #1 referral to [facility] due to rheumatoid arthritis of the ankle she needs a new brace  #2 she is requesting a wheelchair with 4 small wheels known as a transfer wheelchair she utilizes this whenever she goes to a store or any other large area because with her rheumatoid arthritis she can only take a few steps with a walker without severe pain If necessary please get the proper referral so that a Medicare home equipment group can help arrange this for her thank you

## 2024-05-12 DIAGNOSIS — M810 Age-related osteoporosis without current pathological fracture: Secondary | ICD-10-CM | POA: Diagnosis not present

## 2024-05-12 DIAGNOSIS — M549 Dorsalgia, unspecified: Secondary | ICD-10-CM | POA: Diagnosis not present

## 2024-05-12 DIAGNOSIS — M1990 Unspecified osteoarthritis, unspecified site: Secondary | ICD-10-CM | POA: Diagnosis not present

## 2024-05-12 DIAGNOSIS — M0579 Rheumatoid arthritis with rheumatoid factor of multiple sites without organ or systems involvement: Secondary | ICD-10-CM | POA: Diagnosis not present

## 2024-05-12 DIAGNOSIS — M2550 Pain in unspecified joint: Secondary | ICD-10-CM | POA: Diagnosis not present

## 2024-05-12 DIAGNOSIS — M79671 Pain in right foot: Secondary | ICD-10-CM | POA: Diagnosis not present

## 2024-05-12 DIAGNOSIS — M79643 Pain in unspecified hand: Secondary | ICD-10-CM | POA: Diagnosis not present

## 2024-05-12 DIAGNOSIS — M25439 Effusion, unspecified wrist: Secondary | ICD-10-CM | POA: Diagnosis not present

## 2024-05-12 DIAGNOSIS — Z7952 Long term (current) use of systemic steroids: Secondary | ICD-10-CM | POA: Diagnosis not present

## 2024-05-13 ENCOUNTER — Other Ambulatory Visit: Payer: Self-pay

## 2024-05-14 ENCOUNTER — Other Ambulatory Visit: Payer: Self-pay

## 2024-05-14 DIAGNOSIS — G8929 Other chronic pain: Secondary | ICD-10-CM

## 2024-05-14 DIAGNOSIS — M0579 Rheumatoid arthritis with rheumatoid factor of multiple sites without organ or systems involvement: Secondary | ICD-10-CM

## 2024-05-14 NOTE — Telephone Encounter (Signed)
 Placed the order for [facility] under DME orders.

## 2024-05-29 ENCOUNTER — Other Ambulatory Visit: Payer: Self-pay | Admitting: *Deleted

## 2024-05-29 DIAGNOSIS — G8929 Other chronic pain: Secondary | ICD-10-CM

## 2024-05-29 DIAGNOSIS — M0579 Rheumatoid arthritis with rheumatoid factor of multiple sites without organ or systems involvement: Secondary | ICD-10-CM

## 2024-05-29 DIAGNOSIS — G894 Chronic pain syndrome: Secondary | ICD-10-CM

## 2024-05-29 NOTE — Progress Notes (Signed)
 Sabrina Adams

## 2024-05-30 ENCOUNTER — Other Ambulatory Visit: Payer: Self-pay | Admitting: Family Medicine

## 2024-05-30 DIAGNOSIS — I10 Essential (primary) hypertension: Secondary | ICD-10-CM

## 2024-06-05 ENCOUNTER — Other Ambulatory Visit (HOSPITAL_COMMUNITY): Payer: Self-pay | Admitting: Family Medicine

## 2024-06-05 DIAGNOSIS — Z1231 Encounter for screening mammogram for malignant neoplasm of breast: Secondary | ICD-10-CM

## 2024-06-21 ENCOUNTER — Other Ambulatory Visit: Payer: Self-pay | Admitting: Family Medicine

## 2024-06-23 LAB — OPHTHALMOLOGY REPORT-SCANNED

## 2024-06-28 ENCOUNTER — Other Ambulatory Visit: Payer: Self-pay | Admitting: Family Medicine

## 2024-07-07 ENCOUNTER — Ambulatory Visit (HOSPITAL_COMMUNITY)
Admission: RE | Admit: 2024-07-07 | Discharge: 2024-07-07 | Disposition: A | Discharge status: Home / Self Care | Source: Ambulatory Visit | Attending: Family Medicine | Admitting: Family Medicine

## 2024-07-07 DIAGNOSIS — Z1231 Encounter for screening mammogram for malignant neoplasm of breast: Secondary | ICD-10-CM | POA: Diagnosis present

## 2024-08-21 ENCOUNTER — Other Ambulatory Visit: Payer: Self-pay | Admitting: Medical Genetics

## 2024-08-26 ENCOUNTER — Other Ambulatory Visit: Payer: Self-pay | Admitting: Family Medicine

## 2024-09-04 ENCOUNTER — Other Ambulatory Visit (HOSPITAL_COMMUNITY)
Admission: RE | Admit: 2024-09-04 | Discharge: 2024-09-04 | Disposition: A | Discharge status: Home / Self Care | Payer: Self-pay | Source: Ambulatory Visit | Attending: Oncology | Admitting: Oncology

## 2024-10-07 ENCOUNTER — Ambulatory Visit: Admitting: Family Medicine

## 2024-11-07 ENCOUNTER — Ambulatory Visit: Admitting: Family Medicine

## 2025-01-02 ENCOUNTER — Ambulatory Visit
